# Patient Record
Sex: Female | Born: 1935 | Race: White | Hispanic: No | Marital: Single | Smoking: Never smoker
Health system: Southern US, Community
[De-identification: ages and names within clinical notes are randomized; demographics above are authoritative.]

## PROBLEM LIST (undated history)

## (undated) DIAGNOSIS — C801 Malignant (primary) neoplasm, unspecified: Secondary | ICD-10-CM

## (undated) DIAGNOSIS — I10 Essential (primary) hypertension: Secondary | ICD-10-CM

## (undated) DIAGNOSIS — M1990 Unspecified osteoarthritis, unspecified site: Secondary | ICD-10-CM

## (undated) DIAGNOSIS — M810 Age-related osteoporosis without current pathological fracture: Secondary | ICD-10-CM

## (undated) HISTORY — PX: HIP FRACTURE SURGERY: SHX118

## (undated) HISTORY — PX: TONSILLECTOMY: SUR1361

## (undated) HISTORY — PX: COLONOSCOPY: SHX174

## (undated) HISTORY — PX: CARPAL TUNNEL RELEASE: SHX101

## (undated) HISTORY — PX: APPENDECTOMY: SHX54

---

## 2014-08-30 DIAGNOSIS — Z85820 Personal history of malignant melanoma of skin: Secondary | ICD-10-CM | POA: Diagnosis not present

## 2014-08-30 DIAGNOSIS — D2330 Other benign neoplasm of skin of unspecified part of face: Secondary | ICD-10-CM | POA: Diagnosis not present

## 2014-08-30 DIAGNOSIS — D235 Other benign neoplasm of skin of trunk: Secondary | ICD-10-CM | POA: Diagnosis not present

## 2014-08-30 DIAGNOSIS — I781 Nevus, non-neoplastic: Secondary | ICD-10-CM | POA: Diagnosis not present

## 2014-08-30 DIAGNOSIS — D2372 Other benign neoplasm of skin of left lower limb, including hip: Secondary | ICD-10-CM | POA: Diagnosis not present

## 2014-08-30 DIAGNOSIS — L821 Other seborrheic keratosis: Secondary | ICD-10-CM | POA: Diagnosis not present

## 2014-08-30 DIAGNOSIS — L818 Other specified disorders of pigmentation: Secondary | ICD-10-CM | POA: Diagnosis not present

## 2014-08-30 DIAGNOSIS — D234 Other benign neoplasm of skin of scalp and neck: Secondary | ICD-10-CM | POA: Diagnosis not present

## 2014-08-30 DIAGNOSIS — D2362 Other benign neoplasm of skin of left upper limb, including shoulder: Secondary | ICD-10-CM | POA: Diagnosis not present

## 2014-08-30 DIAGNOSIS — D2361 Other benign neoplasm of skin of right upper limb, including shoulder: Secondary | ICD-10-CM | POA: Diagnosis not present

## 2014-08-30 DIAGNOSIS — D239 Other benign neoplasm of skin, unspecified: Secondary | ICD-10-CM | POA: Diagnosis not present

## 2014-08-30 DIAGNOSIS — D2371 Other benign neoplasm of skin of right lower limb, including hip: Secondary | ICD-10-CM | POA: Diagnosis not present

## 2014-11-08 DIAGNOSIS — B351 Tinea unguium: Secondary | ICD-10-CM | POA: Diagnosis not present

## 2014-11-08 DIAGNOSIS — I7389 Other specified peripheral vascular diseases: Secondary | ICD-10-CM | POA: Diagnosis not present

## 2015-02-05 DIAGNOSIS — I10 Essential (primary) hypertension: Secondary | ICD-10-CM | POA: Diagnosis not present

## 2015-03-05 DIAGNOSIS — H2513 Age-related nuclear cataract, bilateral: Secondary | ICD-10-CM | POA: Diagnosis not present

## 2015-05-13 DIAGNOSIS — S0081XA Abrasion of other part of head, initial encounter: Secondary | ICD-10-CM | POA: Diagnosis not present

## 2015-05-13 DIAGNOSIS — D229 Melanocytic nevi, unspecified: Secondary | ICD-10-CM | POA: Diagnosis not present

## 2015-05-14 DIAGNOSIS — Z23 Encounter for immunization: Secondary | ICD-10-CM | POA: Diagnosis not present

## 2015-05-22 DIAGNOSIS — L603 Nail dystrophy: Secondary | ICD-10-CM | POA: Diagnosis not present

## 2015-05-22 DIAGNOSIS — M79609 Pain in unspecified limb: Secondary | ICD-10-CM | POA: Diagnosis not present

## 2015-05-22 DIAGNOSIS — I739 Peripheral vascular disease, unspecified: Secondary | ICD-10-CM | POA: Diagnosis not present

## 2015-06-04 DIAGNOSIS — Z23 Encounter for immunization: Secondary | ICD-10-CM | POA: Diagnosis not present

## 2015-07-10 DIAGNOSIS — I10 Essential (primary) hypertension: Secondary | ICD-10-CM | POA: Diagnosis not present

## 2015-07-10 DIAGNOSIS — Z85820 Personal history of malignant melanoma of skin: Secondary | ICD-10-CM | POA: Diagnosis not present

## 2015-07-10 DIAGNOSIS — Z8781 Personal history of (healed) traumatic fracture: Secondary | ICD-10-CM | POA: Diagnosis not present

## 2015-07-10 DIAGNOSIS — M810 Age-related osteoporosis without current pathological fracture: Secondary | ICD-10-CM | POA: Diagnosis not present

## 2015-07-30 DIAGNOSIS — D1801 Hemangioma of skin and subcutaneous tissue: Secondary | ICD-10-CM | POA: Diagnosis not present

## 2015-07-30 DIAGNOSIS — L814 Other melanin hyperpigmentation: Secondary | ICD-10-CM | POA: Diagnosis not present

## 2015-07-30 DIAGNOSIS — L821 Other seborrheic keratosis: Secondary | ICD-10-CM | POA: Diagnosis not present

## 2015-07-30 DIAGNOSIS — Z85820 Personal history of malignant melanoma of skin: Secondary | ICD-10-CM | POA: Diagnosis not present

## 2015-10-10 DIAGNOSIS — L603 Nail dystrophy: Secondary | ICD-10-CM | POA: Diagnosis not present

## 2015-10-10 DIAGNOSIS — I739 Peripheral vascular disease, unspecified: Secondary | ICD-10-CM | POA: Diagnosis not present

## 2016-01-07 ENCOUNTER — Encounter (HOSPITAL_COMMUNITY): Payer: Self-pay | Admitting: Emergency Medicine

## 2016-01-07 ENCOUNTER — Emergency Department (HOSPITAL_COMMUNITY): Payer: Medicare Other

## 2016-01-07 ENCOUNTER — Emergency Department (HOSPITAL_COMMUNITY)
Admission: EM | Admit: 2016-01-07 | Discharge: 2016-01-07 | Disposition: A | Discharge status: Home / Self Care | Payer: Medicare Other | Attending: Emergency Medicine | Admitting: Emergency Medicine

## 2016-01-07 DIAGNOSIS — M25532 Pain in left wrist: Secondary | ICD-10-CM | POA: Diagnosis not present

## 2016-01-07 DIAGNOSIS — W0110XA Fall on same level from slipping, tripping and stumbling with subsequent striking against unspecified object, initial encounter: Secondary | ICD-10-CM | POA: Diagnosis not present

## 2016-01-07 DIAGNOSIS — Z79891 Long term (current) use of opiate analgesic: Secondary | ICD-10-CM | POA: Insufficient documentation

## 2016-01-07 DIAGNOSIS — T148 Other injury of unspecified body region: Secondary | ICD-10-CM | POA: Diagnosis not present

## 2016-01-07 DIAGNOSIS — Z85820 Personal history of malignant melanoma of skin: Secondary | ICD-10-CM | POA: Diagnosis not present

## 2016-01-07 DIAGNOSIS — I10 Essential (primary) hypertension: Secondary | ICD-10-CM | POA: Diagnosis not present

## 2016-01-07 DIAGNOSIS — S52612A Displaced fracture of left ulna styloid process, initial encounter for closed fracture: Secondary | ICD-10-CM | POA: Diagnosis not present

## 2016-01-07 DIAGNOSIS — S52302A Unspecified fracture of shaft of left radius, initial encounter for closed fracture: Secondary | ICD-10-CM | POA: Insufficient documentation

## 2016-01-07 DIAGNOSIS — S6992XA Unspecified injury of left wrist, hand and finger(s), initial encounter: Secondary | ICD-10-CM | POA: Diagnosis present

## 2016-01-07 DIAGNOSIS — Y999 Unspecified external cause status: Secondary | ICD-10-CM | POA: Insufficient documentation

## 2016-01-07 DIAGNOSIS — Z79899 Other long term (current) drug therapy: Secondary | ICD-10-CM | POA: Diagnosis not present

## 2016-01-07 DIAGNOSIS — Y92238 Other place in hospital as the place of occurrence of the external cause: Secondary | ICD-10-CM | POA: Diagnosis not present

## 2016-01-07 DIAGNOSIS — S52502A Unspecified fracture of the lower end of left radius, initial encounter for closed fracture: Secondary | ICD-10-CM | POA: Diagnosis not present

## 2016-01-07 DIAGNOSIS — S52592A Other fractures of lower end of left radius, initial encounter for closed fracture: Secondary | ICD-10-CM | POA: Diagnosis not present

## 2016-01-07 DIAGNOSIS — Y939 Activity, unspecified: Secondary | ICD-10-CM | POA: Insufficient documentation

## 2016-01-07 DIAGNOSIS — S52572A Other intraarticular fracture of lower end of left radius, initial encounter for closed fracture: Secondary | ICD-10-CM | POA: Diagnosis not present

## 2016-01-07 DIAGNOSIS — Z8781 Personal history of (healed) traumatic fracture: Secondary | ICD-10-CM | POA: Diagnosis not present

## 2016-01-07 DIAGNOSIS — M810 Age-related osteoporosis without current pathological fracture: Secondary | ICD-10-CM | POA: Diagnosis not present

## 2016-01-07 DIAGNOSIS — Z0000 Encounter for general adult medical examination without abnormal findings: Secondary | ICD-10-CM | POA: Diagnosis not present

## 2016-01-07 MED ORDER — HYDROCODONE-ACETAMINOPHEN 5-325 MG PO TABS: 1.0000 | ORAL_TABLET | Freq: Four times a day (QID) | ORAL | Status: DC | PRN

## 2016-01-07 MED ORDER — OXYCODONE-ACETAMINOPHEN 5-325 MG PO TABS
1.0000 | ORAL_TABLET | Freq: Once | ORAL | Status: AC
  Administered 2016-01-07: 1 via ORAL
  Filled 2016-01-07: qty 1

## 2016-01-07 MED ORDER — ONDANSETRON HCL 4 MG/2ML IJ SOLN
4.0000 mg | Freq: Once | INTRAMUSCULAR | Status: AC
  Administered 2016-01-07: 4 mg via INTRAVENOUS
  Filled 2016-01-07: qty 2

## 2016-01-07 MED ORDER — MORPHINE SULFATE (PF) 4 MG/ML IV SOLN
4.0000 mg | Freq: Once | INTRAVENOUS | Status: AC
  Administered 2016-01-07: 4 mg via INTRAVENOUS
  Filled 2016-01-07: qty 1

## 2016-01-07 NOTE — ED Notes (Addendum)
Patient here via EMS with complaints of fall today at PCP office. Here pain to left wrist with deformity. Pain 10/10. 100 mcg Fent. given

## 2016-01-07 NOTE — ED Notes (Signed)
Bed: [id number] Expected date:  Expected time:  Means of arrival:  Comments: 79, fall wrist deformity, syncopal after - first available room on arrival

## 2016-01-07 NOTE — ED Provider Notes (Signed)
CSN: [id number]     Arrival date & time 01/07/16  1313 History   First MD Initiated Contact with Patient 01/07/16 1328     Chief Complaint  Patient presents with  . Fall  . Wrist Pain     (Consider location/radiation/quality/duration/timing/severity/associated sxs/prior Treatment) HPI Comments: 80 year old female with hypertension who presents with left wrist pain. Just prior to arrival, the patient was at her PCP office for a physical and slipped, falling on her left outstretched hand and her bottom. She had an immediate onset of severe, constant left wrist pain. She also endorses mild pain in her sacrum. She denies any head injury, head or neck pain, or other injury. She denies any anticoagulant use. She states she felt lightheaded after the fall, she thinks because of pain.  Patient is a 80 y.o. female presenting with fall and wrist pain. The history is provided by the patient.  Fall  Wrist Pain    History reviewed. No pertinent past medical history. History reviewed. No pertinent past surgical history. History reviewed. No pertinent family history. Social History  Substance Use Topics  . Smoking status: Never Smoker   . Smokeless tobacco: None  . Alcohol Use: None   OB History    No data available     Review of Systems 10 Systems reviewed and are negative for acute change except as noted in the HPI.    Allergies  Review of patient's allergies indicates no known allergies.  Home Medications   Prior to Admission medications   Medication Sig Start Date End Date Taking? Authorizing Provider  alendronate (FOSAMAX) 70 MG tablet Take 70 mg by mouth every Saturday. Take with a full glass of water on an empty stomach.   Yes Historical Provider, MD  lisinopril (PRINIVIL,ZESTRIL) 20 MG tablet Take 20 mg by mouth daily.   Yes Historical Provider, MD  Multiple Vitamin (MULTIVITAMIN WITH MINERALS) TABS tablet Take 1 tablet by mouth daily.   Yes Historical Provider, MD  naproxen  sodium (ANAPROX) 220 MG tablet Take 220 mg by mouth daily as needed (pain).   Yes Historical Provider, MD  HYDROcodone-acetaminophen (NORCO/VICODIN) 5-325 MG tablet Take 1-2 tablets by mouth every 6 (six) hours as needed for severe pain. 01/07/16   Wenda Overland Lasharon Dunivan, MD   BP 130/53 mmHg  Pulse 60  Temp(Src) 98.2 F (36.8 C) (Oral)  Resp 17  SpO2 98% Physical Exam  Constitutional: She is oriented to person, place, and time. She appears well-developed and well-nourished. No distress.  HENT:  Head: Normocephalic and atraumatic.  Moist mucous membranes  Eyes: Conjunctivae are normal. Pupils are equal, round, and reactive to light.  Neck: Normal range of motion. Neck supple.  Cardiovascular: Normal rate, regular rhythm, normal heart sounds and intact distal pulses.   No murmur heard. Pulmonary/Chest: Effort normal and breath sounds normal. She exhibits no tenderness.  Abdominal: Soft. Bowel sounds are normal. She exhibits no distension. There is no tenderness.  Musculoskeletal: She exhibits edema and tenderness.  Closed deformity of distal L forearm; normal sensation and cap refill L fingers; normal ROM L elbow and shoulder without tenderness  Neurological: She is alert and oriented to person, place, and time.  Fluent speech  Skin: Skin is warm and dry.  Psychiatric: She has a normal mood and affect. Judgment normal.  Nursing note and vitals reviewed.   ED Course  Procedures (including critical care time) Labs Review Labs Reviewed - No data to display  Imaging Review Dg Forearm Left  01/07/2016  CLINICAL DATA:  Left wrist fracture status post fall. EXAM: LEFT FOREARM - 2 VIEW COMPARISON:  None. FINDINGS: There is a impacted dorsally displaced distal radial metaphysis fracture. There is a nondisplaced fracture at the base of the ulnar styloid process. There is no other fracture or dislocation. There is no elbow joint effusion. There are calcifications adjacent to the radial tuberosity  in the region of the biceps tendon most concerning for calcific tendinosis. IMPRESSION: 1. Impacted dorsally displaced distal radial metaphysis fracture. 2. Nondisplaced fracture at the base of the ulnar styloid process. Electronically Signed   By: Kathreen Devoid   On: 01/07/2016 14:21   Dg Wrist Complete Left  01/07/2016  CLINICAL DATA:  80 year old female with left wrist pain sustained following a fall at her primary care physician's office. EXAM: LEFT WRIST - COMPLETE 3+ VIEW COMPARISON:  None. FINDINGS: Acute impacted and dorsally displaced distal radius fracture with intra-articular involvement. Additionally, there is a mildly displaced fracture of the ulnar styloid. The bones appear diffusely osteopenic. There is calcification in the region of the triangular fibrocartilage. The scaphoid and carpus appear intact. Small subchondral cysts present in the base of the capitate. There is soft tissue swelling along the dorsal aspect of the wrist. IMPRESSION: 1. Impacted and dorsally displaced distal radius fracture with intra-articular involvement. 2. Minimally displaced ulnar styloid fracture. 3. The bones appear osteopenic. Electronically Signed   By: Jacqulynn Cadet M.D.   On: 01/07/2016 13:48   I have personally reviewed and evaluated these images as part of my medical decision-making.   EKG Interpretation None     Medications  morphine 4 MG/ML injection 4 mg (4 mg Intravenous Given 01/07/16 1429)  ondansetron (ZOFRAN) injection 4 mg (4 mg Intravenous Given 01/07/16 1429)  oxyCODONE-acetaminophen (PERCOCET/ROXICET) 5-325 MG per tablet 1 tablet (1 tablet Oral Given 01/07/16 1537)    MDM   Final diagnoses:  Distal radius fracture, left, closed, initial encounter  Fracture of ulnar styloid, left, closed, initial encounter   Pt w/ L wrist pain after fall from standing. On exam, she had a closed deformity of her left wrist, she was neurovascularly intact. She complained of pain on her bottom where  she fell but denied any other pain. No head injury or neck pain. Plain films show impacted and dorsally displaced distal radius fracture with intra-articular involvement as well as displaced ulnar styloid fracture. He morphine for pain. Discussed with hand surgery, Dr. [name], who recommended sugar tong splint and follow-up in his clinic in 2 days for discussion of operative management. Patient instructed on follow up plan as well as ice, elevation, and pain control. Return precautions reviewed and patient discharged in satisfactory condition.  Sharlett Iles, MD 01/07/16 [phone]

## 2016-01-09 DIAGNOSIS — S52552A Other extraarticular fracture of lower end of left radius, initial encounter for closed fracture: Secondary | ICD-10-CM | POA: Diagnosis not present

## 2016-01-13 ENCOUNTER — Other Ambulatory Visit: Payer: Self-pay | Admitting: Orthopedic Surgery

## 2016-01-15 ENCOUNTER — Encounter (HOSPITAL_COMMUNITY): Payer: Self-pay | Admitting: *Deleted

## 2016-01-15 NOTE — Progress Notes (Signed)
Pt denies cardiac history, chest pain or sob. 

## 2016-01-16 ENCOUNTER — Encounter (HOSPITAL_COMMUNITY)
Admission: RE | Disposition: A | Discharge status: Home Health | Payer: Self-pay | Source: Ambulatory Visit | Attending: Orthopedic Surgery

## 2016-01-16 ENCOUNTER — Encounter (HOSPITAL_COMMUNITY): Payer: Self-pay | Admitting: *Deleted

## 2016-01-16 ENCOUNTER — Ambulatory Visit (HOSPITAL_COMMUNITY): Payer: Medicare Other | Admitting: Anesthesiology

## 2016-01-16 ENCOUNTER — Inpatient Hospital Stay (HOSPITAL_COMMUNITY)
Admission: RE | Admit: 2016-01-16 | Discharge: 2016-01-18 | DRG: 512 | Disposition: A | Discharge status: Home Health | Payer: Medicare Other | Source: Ambulatory Visit | Attending: Orthopedic Surgery | Admitting: Orthopedic Surgery

## 2016-01-16 DIAGNOSIS — S52509A Unspecified fracture of the lower end of unspecified radius, initial encounter for closed fracture: Secondary | ICD-10-CM | POA: Diagnosis present

## 2016-01-16 DIAGNOSIS — S52613A Displaced fracture of unspecified ulna styloid process, initial encounter for closed fracture: Secondary | ICD-10-CM | POA: Diagnosis present

## 2016-01-16 DIAGNOSIS — Z7983 Long term (current) use of bisphosphonates: Secondary | ICD-10-CM

## 2016-01-16 DIAGNOSIS — S52552A Other extraarticular fracture of lower end of left radius, initial encounter for closed fracture: Secondary | ICD-10-CM | POA: Diagnosis not present

## 2016-01-16 DIAGNOSIS — G8918 Other acute postprocedural pain: Secondary | ICD-10-CM | POA: Diagnosis not present

## 2016-01-16 DIAGNOSIS — S52502A Unspecified fracture of the lower end of left radius, initial encounter for closed fracture: Secondary | ICD-10-CM | POA: Diagnosis not present

## 2016-01-16 DIAGNOSIS — W1830XA Fall on same level, unspecified, initial encounter: Secondary | ICD-10-CM | POA: Diagnosis present

## 2016-01-16 DIAGNOSIS — Z85820 Personal history of malignant melanoma of skin: Secondary | ICD-10-CM

## 2016-01-16 DIAGNOSIS — I10 Essential (primary) hypertension: Secondary | ICD-10-CM | POA: Diagnosis present

## 2016-01-16 DIAGNOSIS — M810 Age-related osteoporosis without current pathological fracture: Secondary | ICD-10-CM | POA: Diagnosis present

## 2016-01-16 DIAGNOSIS — S52609A Unspecified fracture of lower end of unspecified ulna, initial encounter for closed fracture: Secondary | ICD-10-CM

## 2016-01-16 DIAGNOSIS — S52572A Other intraarticular fracture of lower end of left radius, initial encounter for closed fracture: Secondary | ICD-10-CM | POA: Diagnosis present

## 2016-01-16 HISTORY — DX: Unspecified osteoarthritis, unspecified site: M19.90

## 2016-01-16 HISTORY — DX: Malignant (primary) neoplasm, unspecified: C80.1

## 2016-01-16 HISTORY — DX: Age-related osteoporosis without current pathological fracture: M81.0

## 2016-01-16 HISTORY — DX: Essential (primary) hypertension: I10

## 2016-01-16 HISTORY — PX: OPEN REDUCTION INTERNAL FIXATION (ORIF) DISTAL RADIAL FRACTURE: SHX5989

## 2016-01-16 LAB — CBC
HEMATOCRIT: 36.2 % (ref 36.0–46.0)
HEMOGLOBIN: 11.6 g/dL — AB (ref 12.0–15.0)
MCH: 29.2 pg (ref 26.0–34.0)
MCHC: 32 g/dL (ref 30.0–36.0)
MCV: 91.2 fL (ref 78.0–100.0)
PLATELETS: 276 10*3/uL (ref 150–400)
RBC: 3.97 MIL/uL (ref 3.87–5.11)
RDW: 13.8 % (ref 11.5–15.5)
WBC: 5.8 10*3/uL (ref 4.0–10.5)

## 2016-01-16 LAB — BASIC METABOLIC PANEL
ANION GAP: 8 (ref 5–15)
BUN: 18 mg/dL (ref 6–20)
CHLORIDE: 104 mmol/L (ref 101–111)
CO2: 27 mmol/L (ref 22–32)
CREATININE: 0.78 mg/dL (ref 0.44–1.00)
Calcium: 9.5 mg/dL (ref 8.9–10.3)
GFR calc non Af Amer: >60 mL/min (ref >60)
Glucose, Bld: 106 mg/dL — ABNORMAL HIGH (ref 65–99)
POTASSIUM: 3.8 mmol/L (ref 3.5–5.1)
SODIUM: 139 mmol/L (ref 135–145)

## 2016-01-16 SURGERY — OPEN REDUCTION INTERNAL FIXATION (ORIF) DISTAL RADIUS FRACTURE
Anesthesia: Regional | Laterality: Left

## 2016-01-16 MED ORDER — FENTANYL CITRATE (PF) 250 MCG/5ML IJ SOLN
INTRAMUSCULAR | Status: AC
  Filled 2016-01-16: qty 5

## 2016-01-16 MED ORDER — 0.9 % SODIUM CHLORIDE (POUR BTL) OPTIME
TOPICAL | Status: DC | PRN
  Administered 2016-01-16: 1000 mL

## 2016-01-16 MED ORDER — LACTATED RINGERS IV SOLN
INTRAVENOUS | Status: DC | PRN
Start: 2016-01-16 — End: 2016-01-16
  Administered 2016-01-16 (×2): via INTRAVENOUS

## 2016-01-16 MED ORDER — SENNA 8.6 MG PO TABS
1.0000 | ORAL_TABLET | Freq: Two times a day (BID) | ORAL | Status: DC
  Administered 2016-01-16 – 2016-01-18 (×4): 8.6 mg via ORAL
  Filled 2016-01-16 (×4): qty 1

## 2016-01-16 MED ORDER — BUPIVACAINE-EPINEPHRINE (PF) 0.5% -1:200000 IJ SOLN
INTRAMUSCULAR | Status: DC | PRN
  Administered 2016-01-16: 20 mL via PERINEURAL

## 2016-01-16 MED ORDER — PHENYLEPHRINE HCL 10 MG/ML IJ SOLN
20.0000 mg | INTRAVENOUS | Status: DC | PRN
  Administered 2016-01-16: 20 ug/min via INTRAVENOUS

## 2016-01-16 MED ORDER — VITAMIN C 500 MG PO TABS
1000.0000 mg | ORAL_TABLET | Freq: Every day | ORAL | Status: DC
  Administered 2016-01-16 – 2016-01-18 (×3): 1000 mg via ORAL
  Filled 2016-01-16 (×3): qty 2

## 2016-01-16 MED ORDER — BUPIVACAINE HCL (PF) 0.25 % IJ SOLN
INTRAMUSCULAR | Status: AC
  Filled 2016-01-16: qty 30

## 2016-01-16 MED ORDER — LISINOPRIL 20 MG PO TABS
20.0000 mg | ORAL_TABLET | Freq: Every day | ORAL | Status: DC
Start: 2016-01-16 — End: 2016-01-18
  Administered 2016-01-16 – 2016-01-18 (×2): 20 mg via ORAL
  Filled 2016-01-16 (×2): qty 1

## 2016-01-16 MED ORDER — LIDOCAINE 2% (20 MG/ML) 5 ML SYRINGE
INTRAMUSCULAR | Status: AC
  Filled 2016-01-16: qty 5

## 2016-01-16 MED ORDER — PHENYLEPHRINE HCL 10 MG/ML IJ SOLN
INTRAMUSCULAR | Status: DC | PRN
Start: 2016-01-16 — End: 2016-01-16
  Administered 2016-01-16 (×3): 80 ug via INTRAVENOUS
  Administered 2016-01-16: 120 ug via INTRAVENOUS
  Administered 2016-01-16: 40 ug via INTRAVENOUS

## 2016-01-16 MED ORDER — CEFAZOLIN SODIUM 1-5 GM-% IV SOLN
1.0000 g | Freq: Three times a day (TID) | INTRAVENOUS | Status: DC
  Administered 2016-01-17 – 2016-01-18 (×5): 1 g via INTRAVENOUS
  Filled 2016-01-16 (×7): qty 50

## 2016-01-16 MED ORDER — MORPHINE SULFATE (PF) 2 MG/ML IV SOLN
1.0000 mg | INTRAVENOUS | Status: DC | PRN
  Administered 2016-01-17: 1 mg via INTRAVENOUS
  Filled 2016-01-16: qty 1

## 2016-01-16 MED ORDER — ONDANSETRON HCL 4 MG PO TABS: 4.0000 mg | ORAL_TABLET | Freq: Four times a day (QID) | ORAL | Status: DC | PRN

## 2016-01-16 MED ORDER — ADULT MULTIVITAMIN W/MINERALS CH
1.0000 | ORAL_TABLET | Freq: Every day | ORAL | Status: DC
  Administered 2016-01-16 – 2016-01-18 (×3): 1 via ORAL
  Filled 2016-01-16 (×3): qty 1

## 2016-01-16 MED ORDER — CEFAZOLIN SODIUM 1-5 GM-% IV SOLN
1.0000 g | INTRAVENOUS | Status: AC
  Administered 2016-01-16: 1 g via INTRAVENOUS
  Filled 2016-01-16: qty 50

## 2016-01-16 MED ORDER — FENTANYL CITRATE (PF) 100 MCG/2ML IJ SOLN
INTRAMUSCULAR | Status: DC | PRN
Start: 2016-01-16 — End: 2016-01-16
  Administered 2016-01-16 (×2): 50 ug via INTRAVENOUS

## 2016-01-16 MED ORDER — CEFAZOLIN SODIUM-DEXTROSE 2-4 GM/100ML-% IV SOLN
INTRAVENOUS | Status: AC
  Filled 2016-01-16: qty 100

## 2016-01-16 MED ORDER — POVIDONE-IODINE 10 % EX SWAB
2.0000 "application " | Freq: Once | CUTANEOUS | Status: AC
  Administered 2016-01-16: 2 via TOPICAL

## 2016-01-16 MED ORDER — CHLORHEXIDINE GLUCONATE 4 % EX LIQD: 60.0000 mL | Freq: Once | CUTANEOUS | Status: DC

## 2016-01-16 MED ORDER — MIDAZOLAM HCL 2 MG/2ML IJ SOLN
INTRAMUSCULAR | Status: AC
  Administered 2016-01-16: 0.5 mg
  Filled 2016-01-16: qty 2

## 2016-01-16 MED ORDER — ONDANSETRON HCL 4 MG/2ML IJ SOLN
INTRAMUSCULAR | Status: DC | PRN
  Administered 2016-01-16: 4 mg via INTRAVENOUS

## 2016-01-16 MED ORDER — OXYCODONE HCL 5 MG PO TABS
5.0000 mg | ORAL_TABLET | ORAL | Status: DC | PRN
  Administered 2016-01-17: 10 mg via ORAL
  Administered 2016-01-17: 5 mg via ORAL
  Administered 2016-01-17: 10 mg via ORAL
  Filled 2016-01-16 (×3): qty 1
  Filled 2016-01-16: qty 2

## 2016-01-16 MED ORDER — PROPOFOL 10 MG/ML IV BOLUS
INTRAVENOUS | Status: DC | PRN
  Administered 2016-01-16: 130 mg via INTRAVENOUS

## 2016-01-16 MED ORDER — GLYCOPYRROLATE 0.2 MG/ML IJ SOLN
INTRAMUSCULAR | Status: DC | PRN
  Administered 2016-01-16: 0.2 mg via INTRAVENOUS

## 2016-01-16 MED ORDER — LIDOCAINE HCL (CARDIAC) 20 MG/ML IV SOLN
INTRAVENOUS | Status: DC | PRN
  Administered 2016-01-16: 40 mg via INTRAVENOUS

## 2016-01-16 MED ORDER — LACTATED RINGERS IV SOLN: INTRAVENOUS | Status: DC

## 2016-01-16 MED ORDER — FENTANYL CITRATE (PF) 100 MCG/2ML IJ SOLN
INTRAMUSCULAR | Status: AC
  Administered 2016-01-16: 50 ug
  Filled 2016-01-16: qty 2

## 2016-01-16 MED ORDER — ONDANSETRON HCL 4 MG/2ML IJ SOLN: 4.0000 mg | Freq: Four times a day (QID) | INTRAMUSCULAR | Status: DC | PRN

## 2016-01-16 MED ORDER — CEFAZOLIN SODIUM-DEXTROSE 2-4 GM/100ML-% IV SOLN
2.0000 g | INTRAVENOUS | Status: AC
  Administered 2016-01-16: 2 g via INTRAVENOUS

## 2016-01-16 SURGICAL SUPPLY — 62 items
BANDAGE ELASTIC 3 VELCRO ST LF (GAUZE/BANDAGES/DRESSINGS) ×3 IMPLANT
BANDAGE ELASTIC 4 VELCRO ST LF (GAUZE/BANDAGES/DRESSINGS) ×3 IMPLANT
BIT DRILL 2.2 SS TIBIAL (BIT) ×3 IMPLANT
BLADE SURG ROTATE 9660 (MISCELLANEOUS) IMPLANT
BNDG ESMARK 4X9 LF (GAUZE/BANDAGES/DRESSINGS) ×3 IMPLANT
BNDG GAUZE ELAST 4 BULKY (GAUZE/BANDAGES/DRESSINGS) ×3 IMPLANT
CORDS BIPOLAR (ELECTRODE) ×3 IMPLANT
COVER SURGICAL LIGHT HANDLE (MISCELLANEOUS) ×3 IMPLANT
CUFF TOURNIQUET SINGLE 18IN (TOURNIQUET CUFF) ×3 IMPLANT
CUFF TOURNIQUET SINGLE 24IN (TOURNIQUET CUFF) IMPLANT
DECANTER SPIKE VIAL GLASS SM (MISCELLANEOUS) IMPLANT
DRAIN TLS ROUND 10FR (DRAIN) IMPLANT
DRAPE OEC MINIVIEW 54X84 (DRAPES) IMPLANT
DRAPE U-SHAPE 47X51 STRL (DRAPES) ×3 IMPLANT
DRSG ADAPTIC 3X8 NADH LF (GAUZE/BANDAGES/DRESSINGS) ×3 IMPLANT
GAUZE SPONGE 4X4 12PLY STRL (GAUZE/BANDAGES/DRESSINGS) ×3 IMPLANT
GAUZE XEROFORM 5X9 LF (GAUZE/BANDAGES/DRESSINGS) ×3 IMPLANT
GLOVE BIOGEL M 8.0 STRL (GLOVE) ×3 IMPLANT
GLOVE SS BIOGEL STRL SZ 8 (GLOVE) ×1 IMPLANT
GLOVE SUPERSENSE BIOGEL SZ 8 (GLOVE) ×2
GOWN STRL REUS W/ TWL LRG LVL3 (GOWN DISPOSABLE) ×3 IMPLANT
GOWN STRL REUS W/ TWL XL LVL3 (GOWN DISPOSABLE) ×3 IMPLANT
GOWN STRL REUS W/TWL LRG LVL3 (GOWN DISPOSABLE) ×6
GOWN STRL REUS W/TWL XL LVL3 (GOWN DISPOSABLE) ×6
KIT BASIN OR (CUSTOM PROCEDURE TRAY) ×3 IMPLANT
KIT ROOM TURNOVER OR (KITS) ×3 IMPLANT
LOOP VESSEL MAXI BLUE (MISCELLANEOUS) IMPLANT
MANIFOLD NEPTUNE II (INSTRUMENTS) ×3 IMPLANT
NEEDLE 22X1 1/2 (OR ONLY) (NEEDLE) IMPLANT
NS IRRIG 1000ML POUR BTL (IV SOLUTION) ×3 IMPLANT
PACK ORTHO EXTREMITY (CUSTOM PROCEDURE TRAY) ×3 IMPLANT
PAD ARMBOARD 7.5X6 YLW CONV (MISCELLANEOUS) ×6 IMPLANT
PAD CAST 4YDX4 CTTN HI CHSV (CAST SUPPLIES) ×1 IMPLANT
PADDING CAST COTTON 4X4 STRL (CAST SUPPLIES) ×2
PEG LOCKING SMOOTH 2.2X16 (Screw) ×3 IMPLANT
PEG LOCKING SMOOTH 2.2X18 (Peg) ×3 IMPLANT
PEG LOCKING SMOOTH 2.2X20 (Screw) ×9 IMPLANT
PEG LOCKING SMOOTH 2.2X22 (Screw) ×6 IMPLANT
PEG LOCKING SMOOTH 2.2X24 (Peg) ×3 IMPLANT
PLATE STANDARD DVR LEFT (Plate) ×3 IMPLANT
PLATE STD DVR LT 24X51 (Plate) ×1 IMPLANT
PUTTY DBM STAGRAFT PLUS 2CC (Putty) ×3 IMPLANT
SCREW LOCK 14X2.7X 3 LD TPR (Screw) ×2 IMPLANT
SCREW LOCK 16X2.7X 3 LD TPR (Screw) ×1 IMPLANT
SCREW LOCKING 2.7X14 (Screw) ×4 IMPLANT
SCREW LOCKING 2.7X15MM (Screw) ×6 IMPLANT
SCREW LOCKING 2.7X16 (Screw) ×2 IMPLANT
SCREW MULTI DIRECTIONAL 2.7X14 (Screw) ×6 IMPLANT
SCREW MULTI DIRECTIONAL 2.7X16 (Screw) ×3 IMPLANT
SPONGE LAP 4X18 X RAY DECT (DISPOSABLE) IMPLANT
SUT MNCRL AB 4-0 PS2 18 (SUTURE) ×3 IMPLANT
SUT PROLENE 3 0 PS 2 (SUTURE) IMPLANT
SUT VIC AB 3-0 FS2 27 (SUTURE) IMPLANT
SYR CONTROL 10ML LL (SYRINGE) IMPLANT
SYSTEM CHEST DRAIN TLS 7FR (DRAIN) ×3 IMPLANT
TOWEL OR 17X24 6PK STRL BLUE (TOWEL DISPOSABLE) ×3 IMPLANT
TOWEL OR 17X26 10 PK STRL BLUE (TOWEL DISPOSABLE) ×3 IMPLANT
TUBE CONNECTING 12'X1/4 (SUCTIONS) ×1
TUBE CONNECTING 12X1/4 (SUCTIONS) ×2 IMPLANT
TUBE EVACUATION TLS (MISCELLANEOUS) ×3 IMPLANT
UNDERPAD 30X30 INCONTINENT (UNDERPADS AND DIAPERS) ×3 IMPLANT
WATER STERILE IRR 1000ML POUR (IV SOLUTION) ×3 IMPLANT

## 2016-01-16 NOTE — Anesthesia Preprocedure Evaluation (Signed)
Anesthesia Evaluation  Patient identified by MRN, date of birth, ID band Patient awake    Reviewed: Allergy & Precautions, NPO status , Patient's Chart, lab work & pertinent test results  History of Anesthesia Complications Negative for: history of anesthetic complications  Airway Mallampati: I  TM Distance: >3 FB Neck ROM: Full    Dental  (+) Teeth Intact   Pulmonary neg pulmonary ROS,    breath sounds clear to auscultation       Cardiovascular hypertension,  Rhythm:Regular Rate:Normal     Neuro/Psych negative neurological ROS     GI/Hepatic negative GI ROS, Neg liver ROS,   Endo/Other  negative endocrine ROS  Renal/GU negative Renal ROS     Musculoskeletal  (+) Arthritis ,   Abdominal   Peds  Hematology negative hematology ROS (+)   Anesthesia Other Findings   Reproductive/Obstetrics                             Anesthesia Physical Anesthesia Plan  ASA: II  Anesthesia Plan: Regional   Post-op Pain Management:    Induction: Intravenous  Airway Management Planned: LMA  Additional Equipment:   Intra-op Plan:   Post-operative Plan:   Informed Consent: I have reviewed the patients History and Physical, chart, labs and discussed the procedure including the risks, benefits and alternatives for the proposed anesthesia with the patient or authorized representative who has indicated his/her understanding and acceptance.     Plan Discussed with: CRNA and Surgeon  Anesthesia Plan Comments:         Anesthesia Quick Evaluation

## 2016-01-16 NOTE — Op Note (Signed)
See [id number] [name] MD

## 2016-01-16 NOTE — Anesthesia Postprocedure Evaluation (Signed)
Anesthesia Post Note  Patient: Holly Salazar  Procedure(s) Performed: Procedure(s) (LRB): OPEN REDUCTION INTERNAL FIXATION (ORIF) LEFT  DISTAL RADIUS FRACTURE WITH REPAIR RECONSTRUCTION AS NEEDED  (Left)  Patient location during evaluation: PACU Anesthesia Type: General Level of consciousness: awake and alert Pain management: pain level controlled Vital Signs Assessment: post-procedure vital signs reviewed and stable Respiratory status: spontaneous breathing, nonlabored ventilation, respiratory function stable and patient connected to nasal cannula oxygen Cardiovascular status: blood pressure returned to baseline and stable Postop Assessment: no signs of nausea or vomiting Anesthetic complications: no    Last Vitals:  Filed Vitals:   01/16/16 1931 01/16/16 2000  BP: 114/62 110/98  Pulse: 57 61  Temp:  36.7 C  Resp: 17 14    Last Pain:  Filed Vitals:   01/16/16 2010  PainSc: 0-No pain                 Catalina Gravel

## 2016-01-16 NOTE — Anesthesia Procedure Notes (Addendum)
Anesthesia Regional Block:  Supraclavicular block  Pre-Anesthetic Checklist: ,, timeout performed, Correct Patient, Correct Site, Correct Laterality, Correct Procedure, Correct Position, site marked, Risks and benefits discussed,  Surgical consent,  Pre-op evaluation,  At surgeon's request and post-op pain management  Laterality: Left and Upper  Prep: chloraprep       Needles:   Needle Type: Echogenic Stimulator Needle     Needle Length: 9cm 9 cm Needle Gauge: 22 and 22 G  Needle insertion depth: 4 cm   Additional Needles:  Procedures: ultrasound guided (picture in chart) and nerve stimulator Supraclavicular block Narrative:  Start time: 01/16/2016 2:50 PM End time: 01/16/2016 3:05 PM Injection made incrementally with aspirations every 5 mL.  Performed by: Personally  Anesthesiologist: MASSAG[place], TERRY  Additional Notes: Tolerated well   Procedure Name: LMA Insertion Date/Time: 01/16/2016 3:44 PM Performed by: Willeen Cass P Pre-anesthesia Checklist: Patient identified, Emergency Drugs available, Suction available, Patient being monitored and Timeout performed Patient Re-evaluated:Patient Re-evaluated prior to inductionOxygen Delivery Method: Circle system utilized Preoxygenation: Pre-oxygenation with 100% oxygen Intubation Type: IV induction Ventilation: Mask ventilation without difficulty LMA: LMA inserted LMA Size: 4.0 Number of attempts: 2 Tube secured with: Tape Dental Injury: Teeth and Oropharynx as per pre-operative assessment

## 2016-01-16 NOTE — H&P (Signed)
Holly Salazar is an 80 y.o. female.   Chief Complaint: fractured wrist HPI: 80 yo Status post a left distal radius fracture comminuted and displaced in nature. We have seen and evaluated the patient in our office setting and have discussed with her all recommendations for operative care. We have had a lengthy discussion with she, her family members as well as her family physician In regards to her care. The patient desires to proceed with surgical intervention. Past Medical History  Diagnosis Date  . Hypertension   . Osteoporosis   . Arthritis     knee and back  . Cancer ([place])     melanoma on back    Past Surgical History  Procedure Laterality Date  . Hip fracture surgery Right   . Tonsillectomy    . Appendectomy    . Colonoscopy    . Carpal tunnel release Right     Family History  Problem Relation Age of Onset  . Heart disease Mother   . Cancer Father    Social History:  reports that she has never smoked. She has never used smokeless tobacco. She reports that she does not drink alcohol or use illicit drugs.  Allergies: No Known Allergies  Medications Prior to Admission  Medication Sig Dispense Refill  . alendronate (FOSAMAX) 70 MG tablet Take 70 mg by mouth every Saturday. Take with a full glass of water on an empty stomach.    [name] HYDROcodone-acetaminophen (NORCO/VICODIN) 5-325 MG tablet Take 1-2 tablets by mouth every 6 (six) hours as needed for severe pain. 12 tablet 0  . lisinopril (PRINIVIL,ZESTRIL) 20 MG tablet Take 20 mg by mouth daily.    . Multiple Vitamin (MULTIVITAMIN WITH MINERALS) TABS tablet Take 1 tablet by mouth daily.    . naproxen sodium (ANAPROX) 220 MG tablet Take 220 mg by mouth daily as needed (pain).      Results for orders placed or performed during the hospital encounter of 01/16/16 (from the past 48 hour(s))  Basic metabolic panel     Status: Abnormal   Collection Time: 01/16/16  1:15 PM  Result Value Ref Range   Sodium 139 135 - 145 mmol/L    Potassium 3.8 3.5 - 5.1 mmol/L   Chloride 104 101 - 111 mmol/L   CO2 27 22 - 32 mmol/L   Glucose, Bld 106 (H) 65 - 99 mg/dL   BUN 18 6 - 20 mg/dL   Creatinine, Ser 0.78 0.44 - 1.00 mg/dL   Calcium 9.5 8.9 - 10.3 mg/dL   GFR calc non Af Amer >60 >60 mL/min   GFR calc Af Amer >60 >60 mL/min    Comment: (NOTE) The eGFR has been calculated using the CKD EPI equation. This calculation has not been validated in all clinical situations. eGFR's persistently <60 mL/min signify possible Chronic Kidney Disease.    Anion gap 8 5 - 15  CBC     Status: Abnormal   Collection Time: 01/16/16  1:15 PM  Result Value Ref Range   WBC 5.8 4.0 - 10.5 K/uL   RBC 3.97 3.87 - 5.11 MIL/uL   Hemoglobin 11.6 (L) 12.0 - 15.0 g/dL   HCT 36.2 36.0 - 46.0 %   MCV 91.2 78.0 - 100.0 fL   MCH 29.2 26.0 - 34.0 pg   MCHC 32.0 30.0 - 36.0 g/dL   RDW 13.8 11.5 - 15.5 %   Platelets 276 150 - 400 K/uL   No results found.  Review of Systems  Constitutional: Negative.  HENT: Negative.   Eyes: Negative.   Respiratory: Negative.   Cardiovascular: Negative.   Gastrointestinal: Negative.   Musculoskeletal:       See HPI  Skin: Negative.     Blood pressure 138/34, pulse 86, temperature 98 F (36.7 C), temperature source Oral, resp. rate 15, height '5\' 7"'$  (1.702 m), weight 59.421 kg (131 lb), SpO2 99 %. Physical Exam  The patient is alert and oriented in no acute distress. The patient complains of pain in the affected upper extremity.  The patient is noted to have a normal HEENT exam. Lung fields show equal chest expansion and no shortness of breath. Abdomen exam is nontender without distention. Lower extremity examination does not show any fracture dislocation or blood clot symptoms. Pelvis is stable and the neck and back are stable and nontender. Lamination of the left upper extremity reveals that her splint is clean dry and intact, digital range of motion is intact, neurovascularly she is  intact Assessment/Plan Comminuted displaced left distal radius fracture .We are planning surgery for your upper extremity. The risk and benefits of surgery to include risk of bleeding, infection, anesthesia,  damage to normal structures and failure of the surgery to accomplish its intended goals of relieving symptoms and restoring function have been discussed in detail. With this in mind we plan to proceed. I have specifically discussed with the patient the pre-and postoperative regime and the dos and don'ts and risk and benefits in great detail. Risk and benefits of surgery also include risk of dystrophy(CRPS), chronic nerve pain, failure of the healing process to go onto completion and other inherent risks of surgery The relavent the pathophysiology of the disease/injury process, as well as the alternatives for treatment and postoperative course of action has been discussed in great detail with the patient who desires to proceed.  We will do everything in our power to help you (the patient) restore function to the upper extremity. It is a pleasure to see this patient today.   Marirose Deveney L, PA-C 01/16/2016, 3:14 PM

## 2016-01-16 NOTE — Transfer of Care (Signed)
Immediate Anesthesia Transfer of Care Note  Patient: Holly Salazar  Procedure(s) Performed: Procedure(s): OPEN REDUCTION INTERNAL FIXATION (ORIF) LEFT  DISTAL RADIUS FRACTURE WITH REPAIR RECONSTRUCTION AS NEEDED  (Left)  Patient Location: PACU  Anesthesia Type:General and Regional  Level of Consciousness: awake, alert , oriented and patient cooperative  Airway & Oxygen Therapy: Patient Spontanous Breathing and Patient connected to face mask oxygen  Post-op Assessment: Report given to RN and Post -op Vital signs reviewed and stable  Post vital signs: Reviewed and stable  Last Vitals:  Filed Vitals:   01/16/16 1505 01/16/16 1731  BP: 138/34 98/80  Pulse: 86 79  Temp:  36.4 C  Resp: 15 16    Last Pain: There were no vitals filed for this visit.       Complications: No apparent anesthesia complications

## 2016-01-16 NOTE — Op Note (Signed)
NAMEMarland Salazar  Holly Salazar, Holly Salazar NO.:  [id number]  MEDICAL RECORD NO.:  [id number]  LOCATION:  [facility]                         FACILITY:  [facility]  PHYSICIAN:  [name]. Georgie Haque, M.D.DATE OF BIRTH:  [birth year]  DATE OF PROCEDURE: DATE OF DISCHARGE:                              OPERATIVE REPORT   PREOPERATIVE DIAGNOSIS:  Comminuted complex intra-articular distal radius fracture, left upper extremity, greater than 3-part.  POSTOPERATIVE DIAGNOSIS:  Comminuted complex intra-articular distal radius fracture, left upper extremity, greater than 3-part.  PROCEDURE: 1. Open reduction and internal fixation distal radius fracture     comminuted complex greater than 3-part with allograft bone graft     (StaGraft and DVR regular plate and screw fixation). 2. AP, lateral, and oblique stress x-rays performed, examined,     interpreted by myself. 3. Closed treatment ulnar styloid fracture.  SURGEON:  [name]. [name], MD  ASSISTANT:  Avelina Laine, PA-C  COMPLICATIONS:  None.  ANESTHESIA:  General with preoperative block.  TOURNIQUET TIME:  Less than an hour.  DRAINS:  One TLS drain.  ESTIMATED BLOOD LOSS:  Minimal.  INDICATIONS:  A pleasant 80 year old female, with comminuted fracture, presents for the above-mentioned surgical intervention.  I have discussed the risks and benefits, and she desires to proceed.  OPERATIVE PROCEDURE:  Patient was seen by myself and Anesthesia, taken to operative suite, underwent smooth induction of general anesthetic laid supine, fully padded, prepped and draped in usual sterile fashion with Betadine scrub and paint.  Preoperatively, Hibiclens scrub was performed by Mr. Jenean Lindau and a 10 minute surgical Betadine scrub was applied by Mr. Jenean Lindau, [facility].  Sterile field was secured.  Time-out observed.  Preoperative antibiotics were given.  She then underwent a volar radial incision.  She had a markedly displaced distal radius fracture.  We were  very careful with handling the soft tissues.  FCR tendon sheath was incised dorsally and palmarly.  Fasciotomy accomplished locally.  Following this, FCR was retracted and the carpal canal contents were pushed ulnarly with careful retraction.  Pronator was incised and the fracture was then accessed.  Following this, the patient then very carefully and cautiously had reduction applied. Following reduction, we then confirmed under x-ray, placed ample amounts of StaGraft, bone graft from [employer] and then applied our plate and screw construct.  The patient's distal radioulnar joint had to be evaluated very closely due to the fact that she had involvement with significant translation.  I took time and readjusted the plate during the course of the operation and checked the pronation and supination to make sure that she looked perfect before final implant placement.  I placed a combination of variable angle screws and locking screws proximally and of course pegs distally as we did not want to encroach upon the joint with anything sharp or the extensor tendons.  At the conclusion of the fixation, she had adequate radial height, inclination, and volar tilt. The flexion-extension of the wrist and pronation supination were soft and looked excellent.  There was no hardware protrusion.  All looked well.  Following this, the patient then underwent a very careful and cautious approach to the closure with closure of the pronator with Vicryl,  irrigation was applied.  Irrigation was then continued as we placed a TLS drain and closed the skin edge with Prolene.  A sterile dressing of Adaptic, Xeroform was applied.  Closed treatment of the ulna styloid fracture was performed due to the fact that there was no gross DRUJ instability.  The patient tolerated the procedure well.  There were no complicating features.  All sponge, needle, and instrument counts were reported as correct.  She was placed in a  long-arm splint in neutral position.  She will be admitted for IV antibiotics.  We will have PT see her for ambulation and make sure that she is stable for home environment.  She plans to go home, but we want to make sure that she meets criteria for this.  Should problems arise, she will notify us.     [name]. [name], M.D.     [facility]  D:  01/16/2016  T:  01/16/2016  Job:  [id number]

## 2016-01-17 ENCOUNTER — Encounter (HOSPITAL_COMMUNITY): Payer: Self-pay | Admitting: Orthopedic Surgery

## 2016-01-17 LAB — CBC WITH DIFFERENTIAL/PLATELET
Basophils Absolute: 0 10*3/uL (ref 0.0–0.1)
Basophils Relative: 0 %
EOS ABS: 0 10*3/uL (ref 0.0–0.7)
Eosinophils Relative: 0 %
HCT: 31.2 % — ABNORMAL LOW (ref 36.0–46.0)
HEMOGLOBIN: 10 g/dL — AB (ref 12.0–15.0)
LYMPHS ABS: 0.8 10*3/uL (ref 0.7–4.0)
LYMPHS PCT: 14 %
MCH: 29.7 pg (ref 26.0–34.0)
MCHC: 32.1 g/dL (ref 30.0–36.0)
MCV: 92.6 fL (ref 78.0–100.0)
MONOS PCT: 7 %
Monocytes Absolute: 0.4 10*3/uL (ref 0.1–1.0)
NEUTROS PCT: 79 %
Neutro Abs: 4.7 10*3/uL (ref 1.7–7.7)
Platelets: 232 10*3/uL (ref 150–400)
RBC: 3.37 MIL/uL — ABNORMAL LOW (ref 3.87–5.11)
RDW: 13.8 % (ref 11.5–15.5)
WBC: 6 10*3/uL (ref 4.0–10.5)

## 2016-01-17 LAB — BASIC METABOLIC PANEL
Anion gap: 5 (ref 5–15)
BUN: 13 mg/dL (ref 6–20)
CHLORIDE: 102 mmol/L (ref 101–111)
CO2: 32 mmol/L (ref 22–32)
CREATININE: 0.81 mg/dL (ref 0.44–1.00)
Calcium: 9 mg/dL (ref 8.9–10.3)
GFR calc Af Amer: >60 mL/min (ref >60)
GFR calc non Af Amer: >60 mL/min (ref >60)
GLUCOSE: 113 mg/dL — AB (ref 65–99)
Potassium: 4.3 mmol/L (ref 3.5–5.1)
Sodium: 139 mmol/L (ref 135–145)

## 2016-01-17 NOTE — Progress Notes (Signed)
Physical Therapy Treatment Patient Details Name: Lanaya Lawes MRN: [id number] DOB: 27-Jun-1936 Today's Date: 01/17/2016    History of Present Illness 80 y.o. female s/p ORIF L distal radius fx. PMH significant for HTN, osteoporosis, arthritis (knee, back), melanoma on back.    PT Comments    Pt seen for initial evaluation and treatment. At this time the patient and family are stating that they are planning to return home following her stay in the hospital. It was described that family (brother-in-law) will be checking on her and that they are arranging for 3-4 hours of care to be provided during the day. With mobility, the patient was able to ambulate 150 ft with [facility] and supervision. BERG score was 39, placing her in the significant risk for falls category. Functionally the pt did not have any loss of balance or instability during functional tasks. PT to continue to follow and further assess mobility and safety with D/C to home.   Follow Up Recommendations  Home health PT;Supervision - Intermittent     Equipment Recommendations  None recommended by PT    Recommendations for Other Services       Precautions / Restrictions Precautions Precautions: Fall Required Braces or Orthoses: Sling Restrictions Weight Bearing Restrictions: Yes LUE Weight Bearing: Weight bear through elbow only    Mobility  Bed Mobility Overal bed mobility: Needs Assistance Bed Mobility: Supine to Sit;Sit to Supine     Supine to sit: Independent Sit to supine: Independent   General bed mobility comments: bed flat, no rails, pt able to don/doff covers independently as well as adjust pillow to support LUE  Transfers Overall transfer level: Needs assistance Equipment used: Straight cane Transfers: Sit to/from Stand Sit to Stand: Supervision         General transfer comment: pt requiring use of Rt UE to perform transfer. Transfers performed from bed and chair (multiple transfers from chair).    Ambulation/Gait Ambulation/Gait assistance: Supervision Ambulation Distance (Feet): 150 Feet Assistive device: Straight cane Gait Pattern/deviations: Step-through pattern;Decreased step length - right;Decreased step length - left Gait velocity: decreased   General Gait Details: decreased stride length bilaterally but no loss of balance or instability noted. Pt consistent with use of SPC.    Stairs            Wheelchair Mobility    Modified Rankin (Stroke Patients Only)       Balance Overall balance assessment: Needs assistance Sitting-balance support: No upper extremity supported;Feet supported Sitting balance-Leahy Scale: Good     Standing balance support: Single extremity supported;During functional activity Standing balance-Leahy Scale: Poor Standing balance comment: Reliant on SPC or other surface for balance.                    Cognition Arousal/Alertness: Awake/alert Behavior During Therapy: WFL for tasks assessed/performed Overall Cognitive Status: Impaired/Different from baseline Area of Impairment: Following commands   Current Attention Level: Selective Memory: Decreased short-term memory Following Commands: Follows one step commands inconsistently Safety/Judgement: Decreased awareness of safety     General Comments: Pt having occasional difficulty understanding and following commands. Reorientation to task or topic needed intermittantly.     Exercises     General Comments        Pertinent Vitals/Pain Pain Assessment: Faces Pain Score: 4  Faces Pain Scale: Hurts little more Pain Location: Lt arm Pain Descriptors / Indicators: Sore Pain Intervention(s): Monitored during session;Limited activity within patient's tolerance    Home Living Family/patient expects to be discharged to::  Private residence Living Arrangements: Alone Available Help at Discharge: Family;Available PRN/intermittently Type of Home: Apartment Home Access: Level  entry   Home Layout: One level Home Equipment: Walker - 2 wheels;Cane - single point Additional Comments: Family reports that they will be checking on her intermittantly and are arranging for someone to stay with her for part of the day.     Prior Function Level of Independence: Independent with assistive device(s)      Comments: reports using SPC outside her home and intermittantly at home.    PT Goals (current goals can now be found in the care plan section) Acute Rehab PT Goals Patient Stated Goal: go home PT Goal Formulation: With patient Time For Goal Achievement: 01/31/16 Potential to Achieve Goals: Good    Frequency  Min 5X/week    PT Plan      Co-evaluation             End of Session Equipment Utilized During Treatment: Gait belt Activity Tolerance: Patient tolerated treatment well Patient left: in bed;with call bell/phone within reach (Lt UE supported)     Time: [id number] PT Time Calculation (min) (ACUTE ONLY): 45 min  Charges:  $Gait Training: 8-22 mins $Therapeutic Activity: 8-22 mins                    G Codes:      Cassell Clement, PT, CSCS Pager [phone] Office [phone]  01/17/2016, 2:41 PM

## 2016-01-17 NOTE — Progress Notes (Signed)
Patient ID: Holly Salazar, female   DOB: 1936/04/06, 80 y.o.   MRN: [id number] Patient is doing quite well.  I discussed with the patient's niece Olin Hauser her upper extremity predicament.  I would recommend that we continue elevation range of motion and see about letting her go home tomorrow. Will request home therapy however I discussed with the family this may or may not be approved. We're waiting PT evaluation to see how steady she is on her feet.  In the interest of her safety I think it would be best to go ahead and have an extra day in the hospital for pain control and therapeutic management  Patient has been seen and examined. Patient has pain appropriate to his injury/process. Patient denies new complaints at this present time. I have discussed the care pathway with nursing staff. Patient is appropriate and alert.  We reviewed vital signs and intake output which are stable.  The upper extremity is neurovascularly intact. Refill is normal. There is no signs of compartment syndrome. There is no signs of dystrophy. There is normal sensation.  I have spent a  great deal of time discussing range of motion edema control and other techniques to decrease edema and promote flexion extension of the fingers. Patient understands the importance of elevation range of motion massage and other measures to lessen pain and prevent swelling.  We have also discussed immobilization to appropriate areas involved.  We have discussed with the patient shoulder range of motion to prevent adhesive capsulitis.  The remainder of the examination is normal today without complicating feature.  Drain was removed without difficulty  Patient will be discharged home. Will plan to see the patient back in the office as per discharge instructions (please see discharge instructions).  Patient had an uneventful hospital course. At the time of discharge patient is stable awake alert and oriented in no acute distress. Regular  diet will be continued and has been tolerated. Patient will notify should have problems occur. There is no signs of DVT infection or other complication at this juncture.  All questions have been incurred and answered.  Please see discharge med list  Vickie Ponds MD

## 2016-01-17 NOTE — Progress Notes (Addendum)
Occupational Therapy Evaluation Patient Details Name: Holly Salazar MRN: [id number] DOB: 07/07/1936 Today's Date: 01/17/2016    History of Present Illness 80 y.o. female s/p ORIF L distal radius fx. PMH significant for HTN, osteoporosis, arthritis (knee, back), melanoma on back.   Clinical Impression   PTA, pt was independent with ADLs and used [facility] for mobility. Pt currently requires min-min guard assist for all ADLs and transfers. Pt demonstrated some cognitive deficits in attention, short-term memory and safety awareness - unsure if this is her baseline or if this is due to medication. Pt is a high fall risk and has significantly decreased safety and deficit awareness when discussing how she would perform IADLs such as grocery shopping and attending appointments. Educated pt to weight-bear only through elbow, HEP for L fingers and shoulder, edema management stratgies, and compensatory strategies for ADLs. At this time, recommend SNF for post-acute rehab stay due to pt's high fall risk and lack of social support. Will continue to follow acutely.    Follow Up Recommendations  SNF;Supervision/Assistance - 24 hour    Equipment Recommendations  3 in 1 bedside comode    Recommendations for Other Services       Precautions / Restrictions Precautions Precautions: Fall Required Braces or Orthoses: Sling Restrictions Weight Bearing Restrictions: Yes LUE Weight Bearing: Weight bear through elbow only      Mobility Bed Mobility Overal bed mobility: Needs Assistance Bed Mobility: Supine to Sit     Supine to sit: Min guard;HOB elevated     General bed mobility comments: HOB elevated, use of bedrails. Min guard for safety.  Transfers Overall transfer level: Needs assistance Equipment used: Straight cane Transfers: Sit to/from Stand Sit to Stand: Min assist;Min guard         General transfer comment: Min assist for inital sit-stand transfer and min guard for all subsequent  transfers. Verbal cues for safe hand placement on seated surfaces.    Balance Overall balance assessment: Needs assistance Sitting-balance support: No upper extremity supported;Feet supported Sitting balance-Leahy Scale: Good     Standing balance support: Single extremity supported;During functional activity Standing balance-Leahy Scale: Poor Standing balance comment: Reliant on SPC or other surface for balance.                   Berg Balance Test Sit to Stand: Able to stand  independently using hands Standing Unsupported: Able to stand safely 2 minutes Sitting with Back Unsupported but Feet Supported on Floor or Stool: Able to sit safely and securely 2 minutes Stand to Sit: Sits safely with minimal use of hands Transfers: Able to transfer safely, minor use of hands Standing Unsupported with Eyes Closed: Able to stand 10 seconds safely Standing Ubsupported with Feet Together: Able to place feet together independently and stand 1 minute safely From Standing, Reach Forward with Outstretched Arm: Can reach forward >12 cm safely (5") From Standing Position, Pick up Object from Floor: Able to pick up shoe, needs supervision From Standing Position, Turn to Look Behind Over each Shoulder: Turn sideways only but maintains balance Turn 360 Degrees: Able to turn 360 degrees safely but slowly Standing Unsupported, Alternately Place Feet on Step/Stool: Able to complete >2 steps/needs minimal assist Standing Unsupported, One Foot in Front: Needs help to step but can hold 15 seconds Standing on One Leg: Unable to try or needs assist to prevent fall Total Score: 39        ADL Overall ADL's : Needs assistance/impaired     Grooming: Wash/dry  hands;Min guard;Standing   Upper Body Bathing: Minimal assitance;Sitting   Lower Body Bathing: Minimal assistance;Sit to/from stand   Upper Body Dressing : Minimal assistance;Sitting   Lower Body Dressing: Minimal assistance;Sit to/from stand    Toilet Transfer: Min guard;Cueing for safety;Comfort height toilet;Ambulation [facility]) Toilet Transfer Details (indicate cue type and reason): cues for safe hand placement  Toileting- Clothing Manipulation and Hygiene: Min guard;Sit to/from stand       Functional mobility during ADLs: Min guard;Cane General ADL Comments: Educated pt on taking increased time for dressing/bathing tasks, edema management strategies, and HEP for wrist and shoulder.     Vision Vision Assessment?: No apparent visual deficits   Perception     Praxis      Pertinent Vitals/Pain Pain Assessment: 0-10 Pain Score: 4  Pain Location: L wrist Pain Descriptors / Indicators: Aching Pain Intervention(s): Limited activity within patient's tolerance;Monitored during session;Premedicated before session;Repositioned     Hand Dominance Right   Extremity/Trunk Assessment Upper Extremity Assessment Upper Extremity Assessment: LUE deficits/detail LUE Deficits / Details: ROM: shoulder wfl, elbow/wrist/fingers limited due to splint; STRNEGTH: generalized weakness LUE: Unable to fully assess due to immobilization LUE Coordination: decreased fine motor;decreased gross motor   Lower Extremity Assessment Lower Extremity Assessment: Defer to PT evaluation   Cervical / Trunk Assessment Cervical / Trunk Assessment: Kyphotic   Communication Communication Communication: No difficulties   Cognition Arousal/Alertness: Awake/alert Behavior During Therapy: WFL for tasks assessed/performed Overall Cognitive Status: Impaired/Different from baseline Area of Impairment: Attention;Memory;Following commands;Safety/judgement   Current Attention Level: Selective Memory: Decreased short-term memory Following Commands: Follows one step commands with increased time Safety/Judgement: Decreased awareness of safety     General Comments: Pt with difficulty completing sentences and repsonding to topic at hand when asked questions.    General Comments       Exercises Exercises: General Upper Extremity     Shoulder Instructions      Home Living Family/patient expects to be discharged to:: Private residence Living Arrangements: Alone Available Help at Discharge: Family;Available PRN/intermittently Type of Home: Apartment Home Access: Level entry     Home Layout: One level     Bathroom Shower/Tub: Walk-in shower;Door   Bathroom Toilet: Handicapped height     Home Equipment: Environmental consultant - 2 wheels;Cane - single point;Shower seat - built in;Hand held shower head          Prior Functioning/Environment Level of Independence: Independent with assistive device(s)        Comments: Uses SPC, drives    OT Diagnosis: Generalized weakness;Acute pain;Cognitive deficits   OT Problem List: Decreased strength;Decreased range of motion;Decreased activity tolerance;Impaired balance (sitting and/or standing);Decreased safety awareness;Decreased knowledge of use of DME or AE;Decreased knowledge of precautions;Decreased cognition;Pain;Impaired UE functional use   OT Treatment/Interventions: Self-care/ADL training;Therapeutic exercise;Energy conservation;DME and/or AE instruction;Therapeutic activities;Patient/family education;Balance training    OT Goals(Current goals can be found in the care plan section) Acute Rehab OT Goals Patient Stated Goal: to go home tomorrow OT Goal Formulation: With patient Time For Goal Achievement: 01/31/16 Potential to Achieve Goals: Good ADL Goals Pt Will Perform Upper Body Bathing: with modified independence;sitting Pt Will Perform Upper Body Dressing: with modified independence;sitting Pt Will Transfer to Toilet: with modified independence;ambulating;bedside commode (over toilet) Pt Will Perform Toileting - Clothing Manipulation and hygiene: with modified independence;sitting/lateral leans;sit to/from stand Pt Will Perform Tub/Shower Transfer: Shower transfer;with modified  independence;ambulating;shower seat Pt/caregiver will Perform Home Exercise Program: Increased ROM;Independently;With written HEP provided;Left upper extremity  OT Frequency: Min 2X/week   Barriers  to D/C: Decreased caregiver support  Lives alone and has intermittent assistance from her niece       Co-evaluation              End of Session Equipment Utilized During Treatment: Gait belt;Other (comment) [facility]) Nurse Communication: Mobility status;Weight bearing status  Activity Tolerance: Patient tolerated treatment well Patient left: in chair;with call bell/phone within reach;Other (comment) (LUE elevated)   Time: [id number] OT Time Calculation (min): 30 min Charges:  OT General Charges $OT Visit: 1 Procedure OT Evaluation $OT Eval Moderate Complexity: 1 Procedure OT Treatments $Therapeutic Exercise: 8-22 mins G-Codes:    Redmond Baseman, OTR/L Pager: [phone] 01/17/2016, 2:30 PM

## 2016-01-18 MED ORDER — HYDROCODONE-ACETAMINOPHEN 5-325 MG PO TABS: 2.0000 | ORAL_TABLET | Freq: Four times a day (QID) | ORAL | Status: DC | PRN

## 2016-01-18 NOTE — Care Management Note (Signed)
Case Management Note  Patient Details  Name: Holly Salazar MRN: [id number] Date of Birth: [birth year]  Subjective/Objective:  80 yo M s/p ORIF L distal radius fx.         Action/Plan: received referral to assist with HHPT, RN, OT, aide and a 3-in-BSC   Expected Discharge Date:     01/18/16             Expected Discharge Plan:  [place]  In-House Referral:     Discharge planning Services  CM Consult  Post Acute Care Choice:    Choice offered to:  Patient  DME Arranged:  3-N-1 DME Agency:  [place]:  RN, PT, OT, Nurse's Aide [place] Agency:  [facility]  Status of Service:  Completed, signed off  Medicare Important Message Given:    Date Medicare IM Given:    Medicare IM give by:    Date Additional Medicare IM Given:    Additional Medicare Important Message give by:     If discussed at [place] of Stay Meetings, dates discussed:    Additional Comments: met with pt at bedside. PT is recommending intermittent supervision. Pt lives alone. She plans to return home alone and she stated that she is able to manage. Discussed with pt going to a rehab facility but she declined. She stated that she had hip surgery in the past and she was able to manage. Her brother-in-law is going to help with her meals. She doesn't have a preference for a [place] agency. Provided pt with a list of [place] agencies. She wants to use [facility]. Contacted [facility] and [facility] for referrals.  Norina Buzzard, RN 01/18/2016, 1:09 PM

## 2016-01-18 NOTE — Progress Notes (Signed)
Physical Therapy Treatment Patient Details Name: Cona Detoro MRN: [id number] DOB: 04-02-1936 Today's Date: 2016-01-22    History of Present Illness 80 y.o. female s/p ORIF L distal radius fx. PMH significant for HTN, osteoporosis, arthritis (knee, back), melanoma on back.    PT Comments    Pt states she feels ready for DC home. Based on standardized balance testing from yesterday she is at risk for falls and this is likely not a new risk. Advised pt on safety at home and ways to minimize risk of falls.    Follow Up Recommendations  Home health PT;Supervision - Intermittent     Equipment Recommendations  None recommended by PT    Recommendations for Other Services       Precautions / Restrictions Precautions Precautions: Fall Required Braces or Orthoses: Sling (for LUE) Restrictions Weight Bearing Restrictions: Yes LUE Weight Bearing: Weight bear through elbow only    Mobility  Bed Mobility Overal bed mobility: Needs Assistance Bed Mobility: Supine to Sit     Supine to sit: Independent     General bed mobility comments: bed flat, no rails, pt able to don/doff covers independently as well as adjust pillow to support LUE  Transfers Overall transfer level: Needs assistance Equipment used: Straight cane Transfers: Sit to/from Stand Sit to Stand: Supervision         General transfer comment: Took several attempts with rocking. Pt reports this is baseline  Ambulation/Gait Ambulation/Gait assistance: Supervision Ambulation Distance (Feet): 200 Feet Assistive device: Straight cane Gait Pattern/deviations: Step-through pattern;Decreased stride length Gait velocity: decreased   General Gait Details: No balance losses   Stairs            Wheelchair Mobility    Modified Rankin (Stroke Patients Only)       Balance     Sitting balance-Leahy Scale: Good       Standing balance-Leahy Scale: Good                      Cognition  Arousal/Alertness: Awake/alert Behavior During Therapy: WFL for tasks assessed/performed Overall Cognitive Status: Within Functional Limits for tasks assessed                 General Comments: No difficulties following commands today    Exercises      General Comments        Pertinent Vitals/Pain Pain Assessment: No/denies pain Faces Pain Scale: Hurts little more    Home Living                      Prior Function            PT Goals (current goals can now be found in the care plan section) Acute Rehab PT Goals Patient Stated Goal: go home today Progress towards PT goals: Progressing toward goals    Frequency  Min 5X/week    PT Plan Current plan remains appropriate    Co-evaluation             End of Session Equipment Utilized During Treatment: Gait belt Activity Tolerance: Patient tolerated treatment well Patient left: with call bell/phone within reach;in chair;with chair alarm set (Lt UE supported)     Time: [id number] PT Time Calculation (min) (ACUTE ONLY): 17 min  Charges:  $Gait Training: 8-22 mins                    G Codes:      Melvern Banker 2016/01/22,  9:26 AM Lavonia Dana, PT  [phone] 01/18/2016

## 2016-01-18 NOTE — Progress Notes (Signed)
Occupational Therapy Treatment Patient Details Name: Holly Salazar MRN: [id number] DOB: 1936/08/06 Today's Date: 01/18/2016    History of present illness 80 y.o. female s/p ORIF L distal radius fx. PMH significant for HTN, osteoporosis, arthritis (knee, back), melanoma on back.   OT comments  Pt has made good progress, but continues to be a high fall risk and strongly encouraged pt to use SPC at all times. Pt able to complete bathing and dressing tasks with min assist and functional transfers with min guard assist for safety. Reviewed HEP for LUE and edema management strategies as well. Pt  All education has been completed and pt has no further questions. Updated discharge plan to reflect pt's progress  - currently recommend HHOT upon d/c and 3in1 for home use. Pt with no further acute OT needs OT signing off.   Follow Up Recommendations  Home health OT;Supervision - Intermittent    Equipment Recommendations  3 in 1 bedside comode    Recommendations for Other Services      Precautions / Restrictions Precautions Precautions: Fall Required Braces or Orthoses: Sling Restrictions Weight Bearing Restrictions: Yes LUE Weight Bearing: Weight bear through elbow only       Mobility Bed Mobility Overal bed mobility: Needs Assistance Bed Mobility: Supine to Sit     Supine to sit: Independent     General bed mobility comments: Pt sitting EOB on OT arrival  Transfers Overall transfer level: Needs assistance Equipment used: Straight cane Transfers: Sit to/from Stand Sit to Stand: Min guard         General transfer comment: Min guard assist for safety. Pt with decreased safety awareness and required verbal cues.     Balance Overall balance assessment: Needs assistance Sitting-balance support: No upper extremity supported;Feet supported Sitting balance-Leahy Scale: Good     Standing balance support: Single extremity supported;During functional activity Standing balance-Leahy  Scale: Fair Standing balance comment: Pt still demonstrates unsteadiness and suhffling gait when completing dyanmic balance tasks                   ADL Overall ADL's : Needs assistance/impaired                 Upper Body Dressing : Minimal assistance;Sitting;Cueing for compensatory techniques Upper Body Dressing Details (indicate cue type and reason): Cues to dress RUE first and undress it last, assist for donning bra and sling  Lower Body Dressing: Min guard;Sit to/from stand   Toilet Transfer: Min guard;Cueing for safety;Ambulation [facility]) Toilet Transfer Details (indicate cue type and reason): cues to move slowly and for safe hand placement Toileting- Clothing Manipulation and Hygiene: Min guard;Sit to/from stand       Functional mobility during ADLs: Min guard;Cane General ADL Comments: Reviewed edema management strategies and HEP for LUE. Educated pt on compensatory strategies for UB ADLs. No family present for OT session.      Vision                     Perception     Praxis      Cognition   Behavior During Therapy: [facility] for tasks assessed/performed Overall Cognitive Status: No family/caregiver present to determine baseline cognitive functioning Area of Impairment: Safety/judgement;Memory;Problem solving     Memory: Decreased short-term memory    Safety/Judgement: Decreased awareness of safety   Problem Solving: Slow processing;Difficulty sequencing;Requires verbal cues General Comments: Pt unable to recall any exercises taught in previous OT sessions. Pt also continues to have difficulty repsonding  to the topic at hand when asked questions and continues to recall stories from when she lived [place] and about her sister who has dementia.     Extremity/Trunk Assessment               Exercises     Shoulder Instructions       General Comments      Pertinent Vitals/ Pain       Pain Assessment: No/denies pain Faces Pain Scale: Hurts  little more  Home Living                                          Prior Functioning/Environment              Frequency       Progress Toward Goals  OT Goals(current goals can now be found in the care plan section)  Progress towards OT goals: Goals met/education completed, patient discharged from OT  Acute Rehab OT Goals Patient Stated Goal: go home today OT Goal Formulation: With patient Time For Goal Achievement: 01/31/16 Potential to Achieve Goals: Good ADL Goals Pt Will Perform Upper Body Bathing: with modified independence;sitting Pt Will Perform Upper Body Dressing: with modified independence;sitting Pt Will Transfer to Toilet: with modified independence;ambulating;bedside commode Pt Will Perform Toileting - Clothing Manipulation and hygiene: with modified independence;sitting/lateral leans;sit to/from stand Pt Will Perform Tub/Shower Transfer: Shower transfer;with modified independence;ambulating;shower seat Pt/caregiver will Perform Home Exercise Program: Increased ROM;Independently;With written HEP provided;Left upper extremity  Plan All goals met and education completed, patient discharged from OT services    Co-evaluation                 End of Session Equipment Utilized During Treatment: Gait belt (SPC and sling)   Activity Tolerance Patient tolerated treatment well   Patient Left in chair;with call bell/phone within reach;with chair alarm set   Nurse Communication Mobility status        Time: 3685-9923 OT Time Calculation (min): 21 min  Charges: OT General Charges $OT Visit: 1 Procedure OT Treatments $Self Care/Home Management : 8-22 mins  Redmond Baseman, OTR/L Pager: [phone] 01/18/2016, 11:28 AM

## 2016-01-18 NOTE — Progress Notes (Signed)
Pt discharge education and instructions completed with pt. Pt discharge home with family to transport her home. Pt IV removed; cast and sling remains on; pt family refused 3-in-1 stating they have one already at home. Charge RN aware and equipment left in equipment room; pt transported off unit via wheelchair with belongings to the side. Francis Gaines Shem Plemmons RN.

## 2016-01-18 NOTE — Discharge Summary (Signed)
Physician Discharge Summary  Patient ID: Holly Salazar MRN: [id number] DOB/AGE: November 13, 1935 80 y.o.  Admit date: 01/16/2016 Discharge date:   Admission Diagnoses: LEFT COMMUNITED DISTAL RADIUS FRACTURE  Past Medical History  Diagnosis Date  . Hypertension   . Osteoporosis   . Arthritis     knee and back  . Cancer ([place])     melanoma on back    Discharge Diagnoses:  Active Problems:   Radius and ulna distal fracture   Surgeries: Procedure(s): OPEN REDUCTION INTERNAL FIXATION (ORIF) LEFT  DISTAL RADIUS FRACTURE WITH REPAIR RECONSTRUCTION AS NEEDED  on 01/16/2016    Consultants:    Discharged Condition: Improved  Hospital Course: Holly Salazar is an 80 y.o. female who was admitted 01/16/2016 with a chief complaint of No chief complaint on file. , and found to have a diagnosis of LEFT COMMUNITED DISTAL RADIUS FRACTURE .  They were brought to the operating room on 01/16/2016 and underwent Procedure(s): OPEN REDUCTION INTERNAL FIXATION (ORIF) LEFT  DISTAL RADIUS FRACTURE WITH REPAIR RECONSTRUCTION AS NEEDED .    They were given perioperative antibiotics: Anti-infectives    Start     Dose/Rate Route Frequency Ordered Stop   01/17/16 0500  ceFAZolin (ANCEF) IVPB 1 g/50 mL premix     1 g 100 mL/hr over 30 Minutes Intravenous Every 8 hours 01/16/16 2049     01/16/16 2100  ceFAZolin (ANCEF) IVPB 1 g/50 mL premix     1 g 100 mL/hr over 30 Minutes Intravenous NOW 01/16/16 2049 01/16/16 2315   01/16/16 1315  ceFAZolin (ANCEF) IVPB 2g/100 mL premix     2 g 200 mL/hr over 30 Minutes Intravenous On call to O.R. 01/16/16 1302 01/16/16 1605   01/16/16 1302  ceFAZolin (ANCEF) 2-4 GM/100ML-% IVPB    Comments:  Sammuel Cooper   : cabinet override      01/16/16 1302 01/17/16 0114    .  They were given sequential compression devices, early ambulation, for DVT prophylaxis.  Recent vital signs: Patient Vitals for the past 24 hrs:  BP Temp Temp src Pulse Resp SpO2  01/18/16 0351 (!) 114/43  mmHg 97.4 F (36.3 C) Oral 74 16 100 %  01/17/16 1949 (!) 105/37 mmHg 100 F (37.8 C) Oral 77 16 98 %  01/17/16 1629 (!) 127/46 mmHg 99.3 F (37.4 C) Oral 71 16 98 %  .  Recent laboratory studies: No results found.  Discharge Medications:     Medication List    ASK your doctor about these medications        alendronate 70 MG tablet  Commonly known as:  FOSAMAX  Take 70 mg by mouth every Saturday. Take with a full glass of water on an empty stomach.     HYDROcodone-acetaminophen 5-325 MG tablet  Commonly known as:  NORCO/VICODIN  Take 1-2 tablets by mouth every 6 (six) hours as needed for severe pain.     lisinopril 20 MG tablet  Commonly known as:  PRINIVIL,ZESTRIL  Take 20 mg by mouth daily.     multivitamin with minerals Tabs tablet  Take 1 tablet by mouth daily.     naproxen sodium 220 MG tablet  Commonly known as:  ANAPROX  Take 220 mg by mouth daily as needed (pain).        Diagnostic Studies: Dg Forearm Left  01/07/2016  CLINICAL DATA:  Left wrist fracture status post fall. EXAM: LEFT FOREARM - 2 VIEW COMPARISON:  None. FINDINGS: There is a impacted dorsally displaced distal radial  metaphysis fracture. There is a nondisplaced fracture at the base of the ulnar styloid process. There is no other fracture or dislocation. There is no elbow joint effusion. There are calcifications adjacent to the radial tuberosity in the region of the biceps tendon most concerning for calcific tendinosis. IMPRESSION: 1. Impacted dorsally displaced distal radial metaphysis fracture. 2. Nondisplaced fracture at the base of the ulnar styloid process. Electronically Signed   By: Kathreen Devoid   On: 01/07/2016 14:21   Dg Wrist Complete Left  01/07/2016  CLINICAL DATA:  80 year old female with left wrist pain sustained following a fall at her primary care physician's office. EXAM: LEFT WRIST - COMPLETE 3+ VIEW COMPARISON:  None. FINDINGS: Acute impacted and dorsally displaced distal radius  fracture with intra-articular involvement. Additionally, there is a mildly displaced fracture of the ulnar styloid. The bones appear diffusely osteopenic. There is calcification in the region of the triangular fibrocartilage. The scaphoid and carpus appear intact. Small subchondral cysts present in the base of the capitate. There is soft tissue swelling along the dorsal aspect of the wrist. IMPRESSION: 1. Impacted and dorsally displaced distal radius fracture with intra-articular involvement. 2. Minimally displaced ulnar styloid fracture. 3. The bones appear osteopenic. Electronically Signed   By: Jacqulynn Cadet M.D.   On: 01/07/2016 13:48    They benefited maximally from their hospital stay and there were no complications.    Patient did quite well with her postop care. She was awake alert and oriented to time of discharge. I discussed her issues with physical therapy who felt that she was at her baseline. I strongly recommended continued cane use for all and do a tour he measures. She understands this. Disposition: 01-Home or Self Care      Follow-up Information    Follow up with Paulene Floor, MD In 12 days.   Specialty:  Orthopedic Surgery   Why:  Please call [phone] to see Dr. [name] in 12 days   Contact information:   [address] [place] [address] [id number]        Signed: Paulene Floor 01/18/2016, 10:04 AM

## 2016-01-18 NOTE — Discharge Instructions (Signed)
Please keep your bandage clean and dry.  Please usually or cane at all times when walking for balance.  Please notify should and problems occur.  Please see Dr. [name] in 12 days for your follow-up  Keep bandage clean and dry.  Call for any problems.  No smoking.  Criteria for driving a car: you should be off your pain medicine for 7-8 hours, able to drive one handed(confident), thinking clearly and feeling able in your judgement to drive. Continue elevation as it will decrease swelling.  If instructed by MD move your fingers within the confines of the bandage/splint.  Use ice if instructed by your MD. Call immediately for any sudden loss of feeling in your hand/arm or change in functional abilities of the extremity.We recommend that you to take vitamin C 1000 mg a day to promote healing. We also recommend that if you require  pain medicine that you take a stool softener to prevent constipation as most pain medicines will have constipation side effects. We recommend either Peri-Colace or Senokot and recommend that you also consider adding MiraLAX as well to prevent the constipation affects from pain medicine if you are required to use them. These medicines are over the counter and may be purchased at a local pharmacy. A cup of yogurt and a probiotic can also be helpful during the recovery process as the medicines can disrupt your intestinal environment.

## 2016-01-20 DIAGNOSIS — I10 Essential (primary) hypertension: Secondary | ICD-10-CM | POA: Diagnosis not present

## 2016-01-20 DIAGNOSIS — M4806 Spinal stenosis, lumbar region: Secondary | ICD-10-CM | POA: Diagnosis not present

## 2016-01-20 DIAGNOSIS — M170 Bilateral primary osteoarthritis of knee: Secondary | ICD-10-CM | POA: Diagnosis not present

## 2016-01-20 DIAGNOSIS — S52502D Unspecified fracture of the lower end of left radius, subsequent encounter for closed fracture with routine healing: Secondary | ICD-10-CM | POA: Diagnosis not present

## 2016-01-20 DIAGNOSIS — M810 Age-related osteoporosis without current pathological fracture: Secondary | ICD-10-CM | POA: Diagnosis not present

## 2016-01-23 DIAGNOSIS — M4806 Spinal stenosis, lumbar region: Secondary | ICD-10-CM | POA: Diagnosis not present

## 2016-01-23 DIAGNOSIS — I10 Essential (primary) hypertension: Secondary | ICD-10-CM | POA: Diagnosis not present

## 2016-01-23 DIAGNOSIS — M170 Bilateral primary osteoarthritis of knee: Secondary | ICD-10-CM | POA: Diagnosis not present

## 2016-01-23 DIAGNOSIS — M810 Age-related osteoporosis without current pathological fracture: Secondary | ICD-10-CM | POA: Diagnosis not present

## 2016-01-23 DIAGNOSIS — S52502D Unspecified fracture of the lower end of left radius, subsequent encounter for closed fracture with routine healing: Secondary | ICD-10-CM | POA: Diagnosis not present

## 2016-01-28 DIAGNOSIS — M4806 Spinal stenosis, lumbar region: Secondary | ICD-10-CM | POA: Diagnosis not present

## 2016-01-28 DIAGNOSIS — S52502D Unspecified fracture of the lower end of left radius, subsequent encounter for closed fracture with routine healing: Secondary | ICD-10-CM | POA: Diagnosis not present

## 2016-01-28 DIAGNOSIS — M170 Bilateral primary osteoarthritis of knee: Secondary | ICD-10-CM | POA: Diagnosis not present

## 2016-01-28 DIAGNOSIS — M810 Age-related osteoporosis without current pathological fracture: Secondary | ICD-10-CM | POA: Diagnosis not present

## 2016-01-28 DIAGNOSIS — I10 Essential (primary) hypertension: Secondary | ICD-10-CM | POA: Diagnosis not present

## 2016-01-31 DIAGNOSIS — M170 Bilateral primary osteoarthritis of knee: Secondary | ICD-10-CM | POA: Diagnosis not present

## 2016-01-31 DIAGNOSIS — S52502D Unspecified fracture of the lower end of left radius, subsequent encounter for closed fracture with routine healing: Secondary | ICD-10-CM | POA: Diagnosis not present

## 2016-01-31 DIAGNOSIS — M810 Age-related osteoporosis without current pathological fracture: Secondary | ICD-10-CM | POA: Diagnosis not present

## 2016-01-31 DIAGNOSIS — I10 Essential (primary) hypertension: Secondary | ICD-10-CM | POA: Diagnosis not present

## 2016-01-31 DIAGNOSIS — M4806 Spinal stenosis, lumbar region: Secondary | ICD-10-CM | POA: Diagnosis not present

## 2016-02-03 DIAGNOSIS — M810 Age-related osteoporosis without current pathological fracture: Secondary | ICD-10-CM | POA: Diagnosis not present

## 2016-02-03 DIAGNOSIS — S52502D Unspecified fracture of the lower end of left radius, subsequent encounter for closed fracture with routine healing: Secondary | ICD-10-CM | POA: Diagnosis not present

## 2016-02-03 DIAGNOSIS — M4806 Spinal stenosis, lumbar region: Secondary | ICD-10-CM | POA: Diagnosis not present

## 2016-02-03 DIAGNOSIS — I10 Essential (primary) hypertension: Secondary | ICD-10-CM | POA: Diagnosis not present

## 2016-02-03 DIAGNOSIS — M170 Bilateral primary osteoarthritis of knee: Secondary | ICD-10-CM | POA: Diagnosis not present

## 2016-02-06 DIAGNOSIS — M810 Age-related osteoporosis without current pathological fracture: Secondary | ICD-10-CM | POA: Diagnosis not present

## 2016-02-06 DIAGNOSIS — I10 Essential (primary) hypertension: Secondary | ICD-10-CM | POA: Diagnosis not present

## 2016-02-06 DIAGNOSIS — M4806 Spinal stenosis, lumbar region: Secondary | ICD-10-CM | POA: Diagnosis not present

## 2016-02-06 DIAGNOSIS — M170 Bilateral primary osteoarthritis of knee: Secondary | ICD-10-CM | POA: Diagnosis not present

## 2016-02-06 DIAGNOSIS — S52502D Unspecified fracture of the lower end of left radius, subsequent encounter for closed fracture with routine healing: Secondary | ICD-10-CM | POA: Diagnosis not present

## 2016-02-10 DIAGNOSIS — S62102D Fracture of unspecified carpal bone, left wrist, subsequent encounter for fracture with routine healing: Secondary | ICD-10-CM | POA: Diagnosis not present

## 2016-02-10 DIAGNOSIS — Z4789 Encounter for other orthopedic aftercare: Secondary | ICD-10-CM | POA: Diagnosis not present

## 2016-02-14 DIAGNOSIS — S62102D Fracture of unspecified carpal bone, left wrist, subsequent encounter for fracture with routine healing: Secondary | ICD-10-CM | POA: Diagnosis not present

## 2016-02-18 DIAGNOSIS — R269 Unspecified abnormalities of gait and mobility: Secondary | ICD-10-CM | POA: Diagnosis not present

## 2016-02-19 DIAGNOSIS — S62102D Fracture of unspecified carpal bone, left wrist, subsequent encounter for fracture with routine healing: Secondary | ICD-10-CM | POA: Diagnosis not present

## 2016-02-24 DIAGNOSIS — S62102D Fracture of unspecified carpal bone, left wrist, subsequent encounter for fracture with routine healing: Secondary | ICD-10-CM | POA: Diagnosis not present

## 2016-02-26 DIAGNOSIS — R269 Unspecified abnormalities of gait and mobility: Secondary | ICD-10-CM | POA: Diagnosis not present

## 2016-02-28 DIAGNOSIS — S62102D Fracture of unspecified carpal bone, left wrist, subsequent encounter for fracture with routine healing: Secondary | ICD-10-CM | POA: Diagnosis not present

## 2016-02-28 DIAGNOSIS — S62102A Fracture of unspecified carpal bone, left wrist, initial encounter for closed fracture: Secondary | ICD-10-CM | POA: Diagnosis not present

## 2016-03-03 DIAGNOSIS — H3561 Retinal hemorrhage, right eye: Secondary | ICD-10-CM | POA: Diagnosis not present

## 2016-03-03 DIAGNOSIS — H35033 Hypertensive retinopathy, bilateral: Secondary | ICD-10-CM | POA: Diagnosis not present

## 2016-03-03 DIAGNOSIS — H2513 Age-related nuclear cataract, bilateral: Secondary | ICD-10-CM | POA: Diagnosis not present

## 2016-03-04 DIAGNOSIS — S62102D Fracture of unspecified carpal bone, left wrist, subsequent encounter for fracture with routine healing: Secondary | ICD-10-CM | POA: Diagnosis not present

## 2016-03-06 DIAGNOSIS — S62102D Fracture of unspecified carpal bone, left wrist, subsequent encounter for fracture with routine healing: Secondary | ICD-10-CM | POA: Diagnosis not present

## 2016-03-10 DIAGNOSIS — S62102D Fracture of unspecified carpal bone, left wrist, subsequent encounter for fracture with routine healing: Secondary | ICD-10-CM | POA: Diagnosis not present

## 2016-03-13 DIAGNOSIS — S62102D Fracture of unspecified carpal bone, left wrist, subsequent encounter for fracture with routine healing: Secondary | ICD-10-CM | POA: Diagnosis not present

## 2016-03-17 DIAGNOSIS — S62102D Fracture of unspecified carpal bone, left wrist, subsequent encounter for fracture with routine healing: Secondary | ICD-10-CM | POA: Diagnosis not present

## 2016-03-20 DIAGNOSIS — S62102A Fracture of unspecified carpal bone, left wrist, initial encounter for closed fracture: Secondary | ICD-10-CM | POA: Diagnosis not present

## 2016-03-23 DIAGNOSIS — S62102D Fracture of unspecified carpal bone, left wrist, subsequent encounter for fracture with routine healing: Secondary | ICD-10-CM | POA: Diagnosis not present

## 2016-03-26 DIAGNOSIS — Z4789 Encounter for other orthopedic aftercare: Secondary | ICD-10-CM | POA: Diagnosis not present

## 2016-04-01 DIAGNOSIS — S62102D Fracture of unspecified carpal bone, left wrist, subsequent encounter for fracture with routine healing: Secondary | ICD-10-CM | POA: Diagnosis not present

## 2016-04-03 DIAGNOSIS — S62102D Fracture of unspecified carpal bone, left wrist, subsequent encounter for fracture with routine healing: Secondary | ICD-10-CM | POA: Diagnosis not present

## 2016-04-22 DIAGNOSIS — H35033 Hypertensive retinopathy, bilateral: Secondary | ICD-10-CM | POA: Diagnosis not present

## 2016-04-23 DIAGNOSIS — Z4789 Encounter for other orthopedic aftercare: Secondary | ICD-10-CM | POA: Diagnosis not present

## 2016-05-11 DIAGNOSIS — I739 Peripheral vascular disease, unspecified: Secondary | ICD-10-CM | POA: Diagnosis not present

## 2016-05-11 DIAGNOSIS — L603 Nail dystrophy: Secondary | ICD-10-CM | POA: Diagnosis not present

## 2017-01-28 DIAGNOSIS — Z85820 Personal history of malignant melanoma of skin: Secondary | ICD-10-CM | POA: Diagnosis not present

## 2017-01-28 DIAGNOSIS — I10 Essential (primary) hypertension: Secondary | ICD-10-CM | POA: Diagnosis not present

## 2017-01-28 DIAGNOSIS — M810 Age-related osteoporosis without current pathological fracture: Secondary | ICD-10-CM | POA: Diagnosis not present

## 2017-01-28 DIAGNOSIS — R2681 Unsteadiness on feet: Secondary | ICD-10-CM | POA: Diagnosis not present

## 2017-01-28 DIAGNOSIS — Z8781 Personal history of (healed) traumatic fracture: Secondary | ICD-10-CM | POA: Diagnosis not present

## 2017-03-22 DIAGNOSIS — H35363 Drusen (degenerative) of macula, bilateral: Secondary | ICD-10-CM | POA: Diagnosis not present

## 2017-08-09 IMAGING — CR DG FOREARM 2V*L*
3 series · 3 of 3 positions shown · non-contrast
Comparison: None.

CLINICAL DATA: Left wrist fracture status post fall.

EXAM:
LEFT FOREARM - 2 VIEW

[x forearm ap left]
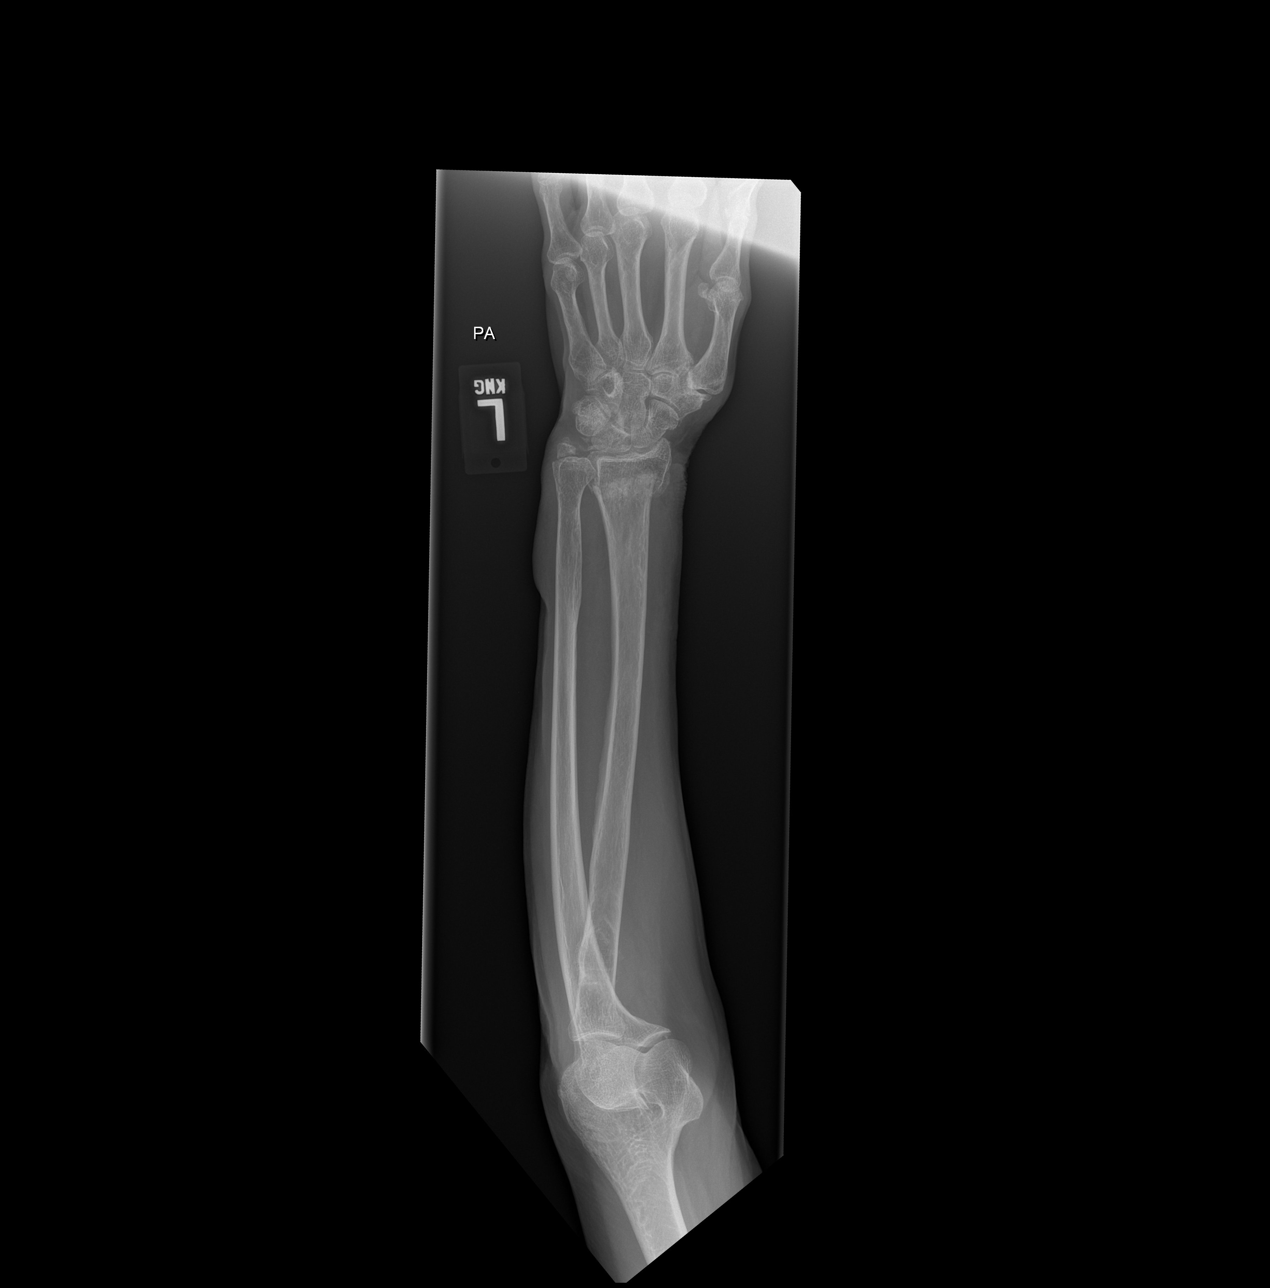

[x forearm lat left (1 of 2)]
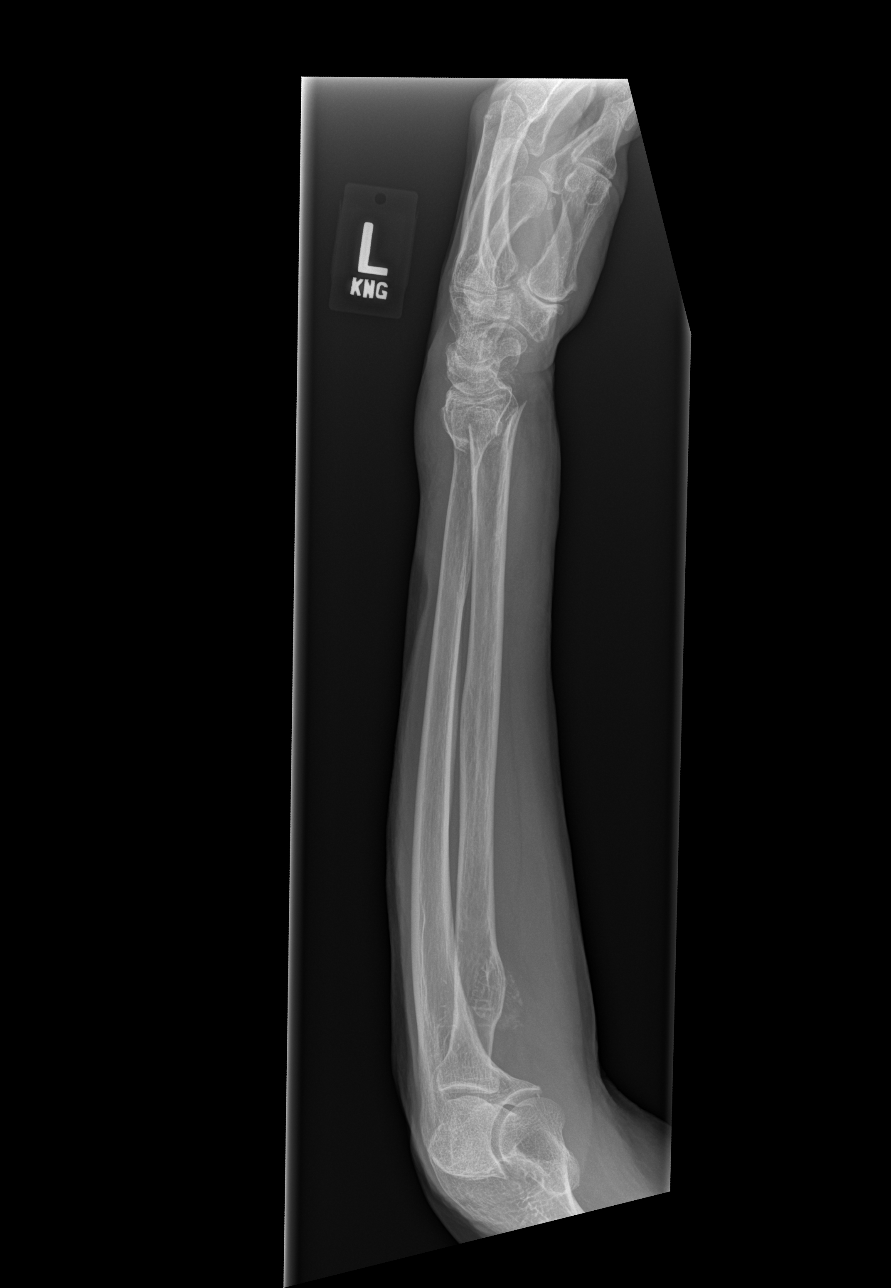

[x forearm lat left (2 of 2)]
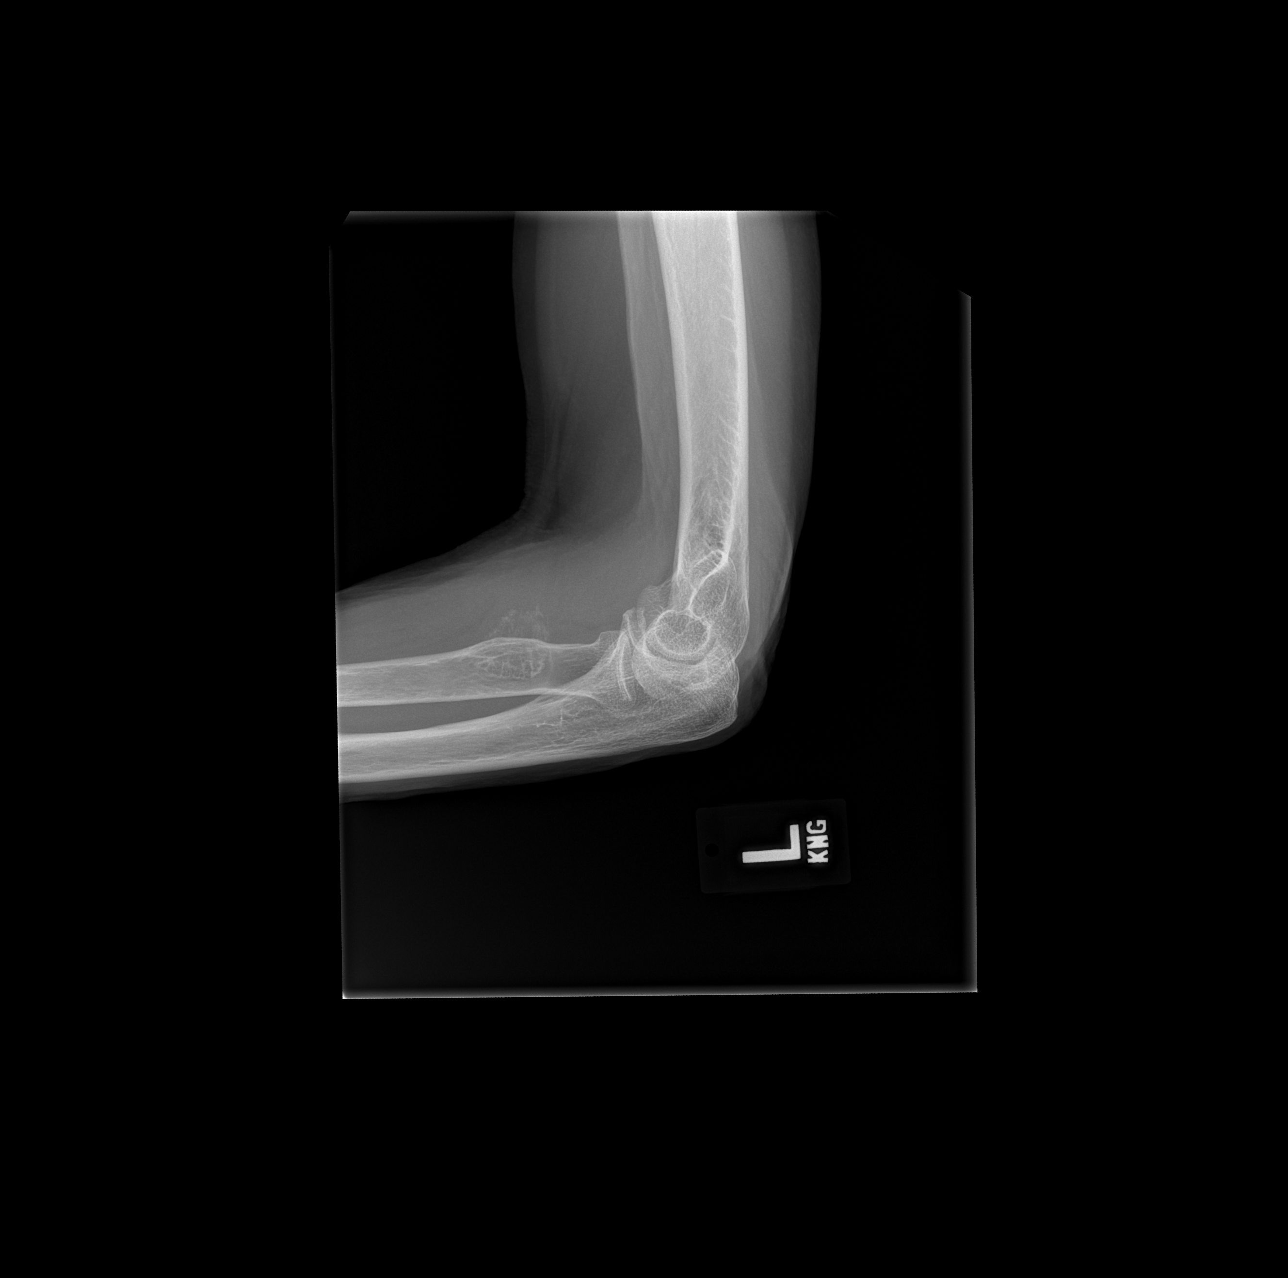

[3 of 3 positions shown; findings below may reference images not displayed]

FINDINGS: There is a impacted dorsally displaced distal radial metaphysis
fracture. There is a nondisplaced fracture at the base of the ulnar
styloid process. There is no other fracture or dislocation. There is
no elbow joint effusion. There are calcifications adjacent to the
radial tuberosity in the region of the biceps tendon most concerning
for calcific tendinosis.
IMPRESSION: 1. Impacted dorsally displaced distal radial metaphysis fracture.
2. Nondisplaced fracture at the base of the ulnar styloid process.

## 2018-01-31 DIAGNOSIS — I10 Essential (primary) hypertension: Secondary | ICD-10-CM | POA: Diagnosis not present

## 2018-01-31 DIAGNOSIS — E559 Vitamin D deficiency, unspecified: Secondary | ICD-10-CM | POA: Diagnosis not present

## 2018-01-31 DIAGNOSIS — Z6820 Body mass index (BMI) 20.0-20.9, adult: Secondary | ICD-10-CM | POA: Diagnosis not present

## 2018-01-31 DIAGNOSIS — M810 Age-related osteoporosis without current pathological fracture: Secondary | ICD-10-CM | POA: Diagnosis not present

## 2018-03-23 DIAGNOSIS — H35711 Central serous chorioretinopathy, right eye: Secondary | ICD-10-CM | POA: Diagnosis not present

## 2018-03-23 DIAGNOSIS — H524 Presbyopia: Secondary | ICD-10-CM | POA: Diagnosis not present

## 2018-04-18 DIAGNOSIS — H35711 Central serous chorioretinopathy, right eye: Secondary | ICD-10-CM | POA: Diagnosis not present

## 2018-05-05 DIAGNOSIS — H43813 Vitreous degeneration, bilateral: Secondary | ICD-10-CM | POA: Diagnosis not present

## 2018-05-05 DIAGNOSIS — H353132 Nonexudative age-related macular degeneration, bilateral, intermediate dry stage: Secondary | ICD-10-CM | POA: Diagnosis not present

## 2018-06-07 DIAGNOSIS — M810 Age-related osteoporosis without current pathological fracture: Secondary | ICD-10-CM | POA: Diagnosis not present

## 2018-06-07 DIAGNOSIS — H3530 Unspecified macular degeneration: Secondary | ICD-10-CM | POA: Diagnosis not present

## 2018-06-07 DIAGNOSIS — Z0000 Encounter for general adult medical examination without abnormal findings: Secondary | ICD-10-CM | POA: Diagnosis not present

## 2018-06-07 DIAGNOSIS — I10 Essential (primary) hypertension: Secondary | ICD-10-CM | POA: Diagnosis not present

## 2018-06-07 DIAGNOSIS — Z1389 Encounter for screening for other disorder: Secondary | ICD-10-CM | POA: Diagnosis not present

## 2018-06-07 DIAGNOSIS — Z9989 Dependence on other enabling machines and devices: Secondary | ICD-10-CM | POA: Diagnosis not present

## 2018-06-07 DIAGNOSIS — R2681 Unsteadiness on feet: Secondary | ICD-10-CM | POA: Diagnosis not present

## 2018-06-09 DIAGNOSIS — H353132 Nonexudative age-related macular degeneration, bilateral, intermediate dry stage: Secondary | ICD-10-CM | POA: Diagnosis not present

## 2018-08-11 DIAGNOSIS — H353132 Nonexudative age-related macular degeneration, bilateral, intermediate dry stage: Secondary | ICD-10-CM | POA: Diagnosis not present

## 2018-08-11 DIAGNOSIS — H31103 Choroidal degeneration, unspecified, bilateral: Secondary | ICD-10-CM | POA: Diagnosis not present

## 2018-08-11 DIAGNOSIS — H43813 Vitreous degeneration, bilateral: Secondary | ICD-10-CM | POA: Diagnosis not present

## 2019-04-04 DIAGNOSIS — H35363 Drusen (degenerative) of macula, bilateral: Secondary | ICD-10-CM | POA: Diagnosis not present

## 2019-05-29 DIAGNOSIS — Z23 Encounter for immunization: Secondary | ICD-10-CM | POA: Diagnosis not present

## 2019-06-16 DIAGNOSIS — R634 Abnormal weight loss: Secondary | ICD-10-CM | POA: Diagnosis not present

## 2019-06-16 DIAGNOSIS — M810 Age-related osteoporosis without current pathological fracture: Secondary | ICD-10-CM | POA: Diagnosis not present

## 2019-06-16 DIAGNOSIS — I10 Essential (primary) hypertension: Secondary | ICD-10-CM | POA: Diagnosis not present

## 2019-06-16 DIAGNOSIS — R4589 Other symptoms and signs involving emotional state: Secondary | ICD-10-CM | POA: Diagnosis not present

## 2019-06-16 DIAGNOSIS — Z681 Body mass index (BMI) 19 or less, adult: Secondary | ICD-10-CM | POA: Diagnosis not present

## 2019-06-16 DIAGNOSIS — Z8781 Personal history of (healed) traumatic fracture: Secondary | ICD-10-CM | POA: Diagnosis not present

## 2019-06-16 DIAGNOSIS — Z85820 Personal history of malignant melanoma of skin: Secondary | ICD-10-CM | POA: Diagnosis not present

## 2019-06-16 DIAGNOSIS — Z0000 Encounter for general adult medical examination without abnormal findings: Secondary | ICD-10-CM | POA: Diagnosis not present

## 2019-10-24 ENCOUNTER — Encounter (HOSPITAL_COMMUNITY): Payer: Self-pay | Admitting: Emergency Medicine

## 2019-10-24 ENCOUNTER — Other Ambulatory Visit: Payer: Self-pay

## 2019-10-24 ENCOUNTER — Emergency Department (HOSPITAL_COMMUNITY): Payer: Medicare Other

## 2019-10-24 ENCOUNTER — Inpatient Hospital Stay (HOSPITAL_COMMUNITY)
Admission: EM | Admit: 2019-10-24 | Discharge: 2019-10-30 | DRG: 480 | Disposition: A | Discharge status: Skilled Nursing Facility | Payer: Medicare Other | Attending: Internal Medicine | Admitting: Internal Medicine

## 2019-10-24 DIAGNOSIS — Z03818 Encounter for observation for suspected exposure to other biological agents ruled out: Secondary | ICD-10-CM | POA: Diagnosis not present

## 2019-10-24 DIAGNOSIS — R457 State of emotional shock and stress, unspecified: Secondary | ICD-10-CM | POA: Diagnosis not present

## 2019-10-24 DIAGNOSIS — D62 Acute posthemorrhagic anemia: Secondary | ICD-10-CM | POA: Diagnosis not present

## 2019-10-24 DIAGNOSIS — F05 Delirium due to known physiological condition: Secondary | ICD-10-CM | POA: Diagnosis not present

## 2019-10-24 DIAGNOSIS — S72142S Displaced intertrochanteric fracture of left femur, sequela: Secondary | ICD-10-CM | POA: Diagnosis not present

## 2019-10-24 DIAGNOSIS — E1165 Type 2 diabetes mellitus with hyperglycemia: Secondary | ICD-10-CM | POA: Diagnosis not present

## 2019-10-24 DIAGNOSIS — M25552 Pain in left hip: Secondary | ICD-10-CM | POA: Diagnosis not present

## 2019-10-24 DIAGNOSIS — R54 Age-related physical debility: Secondary | ICD-10-CM | POA: Diagnosis not present

## 2019-10-24 DIAGNOSIS — R262 Difficulty in walking, not elsewhere classified: Secondary | ICD-10-CM | POA: Diagnosis not present

## 2019-10-24 DIAGNOSIS — Z791 Long term (current) use of non-steroidal anti-inflammatories (NSAID): Secondary | ICD-10-CM

## 2019-10-24 DIAGNOSIS — G9341 Metabolic encephalopathy: Secondary | ICD-10-CM | POA: Diagnosis not present

## 2019-10-24 DIAGNOSIS — R402411 Glasgow coma scale score 13-15, in the field [EMT or ambulance]: Secondary | ICD-10-CM | POA: Diagnosis not present

## 2019-10-24 DIAGNOSIS — Z85820 Personal history of malignant melanoma of skin: Secondary | ICD-10-CM

## 2019-10-24 DIAGNOSIS — N895 Stricture and atresia of vagina: Secondary | ICD-10-CM | POA: Diagnosis not present

## 2019-10-24 DIAGNOSIS — M2550 Pain in unspecified joint: Secondary | ICD-10-CM | POA: Diagnosis not present

## 2019-10-24 DIAGNOSIS — M1710 Unilateral primary osteoarthritis, unspecified knee: Secondary | ICD-10-CM | POA: Diagnosis not present

## 2019-10-24 DIAGNOSIS — R339 Retention of urine, unspecified: Secondary | ICD-10-CM | POA: Diagnosis present

## 2019-10-24 DIAGNOSIS — S72142A Displaced intertrochanteric fracture of left femur, initial encounter for closed fracture: Secondary | ICD-10-CM | POA: Diagnosis not present

## 2019-10-24 DIAGNOSIS — Y92009 Unspecified place in unspecified non-institutional (private) residence as the place of occurrence of the external cause: Secondary | ICD-10-CM

## 2019-10-24 DIAGNOSIS — Z809 Family history of malignant neoplasm, unspecified: Secondary | ICD-10-CM

## 2019-10-24 DIAGNOSIS — E43 Unspecified severe protein-calorie malnutrition: Secondary | ICD-10-CM | POA: Diagnosis present

## 2019-10-24 DIAGNOSIS — Z6820 Body mass index (BMI) 20.0-20.9, adult: Secondary | ICD-10-CM | POA: Diagnosis not present

## 2019-10-24 DIAGNOSIS — Z79899 Other long term (current) drug therapy: Secondary | ICD-10-CM | POA: Diagnosis not present

## 2019-10-24 DIAGNOSIS — I959 Hypotension, unspecified: Secondary | ICD-10-CM | POA: Diagnosis not present

## 2019-10-24 DIAGNOSIS — S299XXA Unspecified injury of thorax, initial encounter: Secondary | ICD-10-CM | POA: Diagnosis not present

## 2019-10-24 DIAGNOSIS — Z20822 Contact with and (suspected) exposure to covid-19: Secondary | ICD-10-CM | POA: Diagnosis present

## 2019-10-24 DIAGNOSIS — Z8249 Family history of ischemic heart disease and other diseases of the circulatory system: Secondary | ICD-10-CM

## 2019-10-24 DIAGNOSIS — M810 Age-related osteoporosis without current pathological fracture: Secondary | ICD-10-CM | POA: Diagnosis not present

## 2019-10-24 DIAGNOSIS — Z79891 Long term (current) use of opiate analgesic: Secondary | ICD-10-CM | POA: Diagnosis not present

## 2019-10-24 DIAGNOSIS — R52 Pain, unspecified: Secondary | ICD-10-CM | POA: Diagnosis not present

## 2019-10-24 DIAGNOSIS — I10 Essential (primary) hypertension: Secondary | ICD-10-CM | POA: Diagnosis present

## 2019-10-24 DIAGNOSIS — Z4789 Encounter for other orthopedic aftercare: Secondary | ICD-10-CM | POA: Diagnosis not present

## 2019-10-24 DIAGNOSIS — W07XXXA Fall from chair, initial encounter: Secondary | ICD-10-CM | POA: Diagnosis present

## 2019-10-24 DIAGNOSIS — M1990 Unspecified osteoarthritis, unspecified site: Secondary | ICD-10-CM | POA: Diagnosis not present

## 2019-10-24 DIAGNOSIS — R0902 Hypoxemia: Secondary | ICD-10-CM | POA: Diagnosis not present

## 2019-10-24 DIAGNOSIS — M6281 Muscle weakness (generalized): Secondary | ICD-10-CM | POA: Diagnosis not present

## 2019-10-24 DIAGNOSIS — Z9181 History of falling: Secondary | ICD-10-CM | POA: Diagnosis not present

## 2019-10-24 DIAGNOSIS — R41841 Cognitive communication deficit: Secondary | ICD-10-CM | POA: Diagnosis not present

## 2019-10-24 DIAGNOSIS — S72002A Fracture of unspecified part of neck of left femur, initial encounter for closed fracture: Secondary | ICD-10-CM

## 2019-10-24 DIAGNOSIS — R2681 Unsteadiness on feet: Secondary | ICD-10-CM | POA: Diagnosis not present

## 2019-10-24 DIAGNOSIS — R338 Other retention of urine: Secondary | ICD-10-CM | POA: Diagnosis not present

## 2019-10-24 DIAGNOSIS — M545 Low back pain: Secondary | ICD-10-CM | POA: Diagnosis present

## 2019-10-24 DIAGNOSIS — Z466 Encounter for fitting and adjustment of urinary device: Secondary | ICD-10-CM | POA: Diagnosis not present

## 2019-10-24 DIAGNOSIS — Z7401 Bed confinement status: Secondary | ICD-10-CM | POA: Diagnosis not present

## 2019-10-24 LAB — CBC WITH DIFFERENTIAL/PLATELET
Abs Immature Granulocytes: 0.01 10*3/uL (ref 0.00–0.07)
Basophils Absolute: 0 10*3/uL (ref 0.0–0.1)
Basophils Relative: 0 %
Eosinophils Absolute: 0 10*3/uL (ref 0.0–0.5)
Eosinophils Relative: 0 %
HCT: 37.6 % (ref 36.0–46.0)
Hemoglobin: 12.1 g/dL (ref 12.0–15.0)
Immature Granulocytes: 0 %
Lymphocytes Relative: 18 %
Lymphs Abs: 1 10*3/uL (ref 0.7–4.0)
MCH: 30.8 pg (ref 26.0–34.0)
MCHC: 32.2 g/dL (ref 30.0–36.0)
MCV: 95.7 fL (ref 80.0–100.0)
Monocytes Absolute: 0.3 10*3/uL (ref 0.1–1.0)
Monocytes Relative: 5 %
Neutro Abs: 4.5 10*3/uL (ref 1.7–7.7)
Neutrophils Relative %: 77 %
Platelets: 217 10*3/uL (ref 150–400)
RBC: 3.93 MIL/uL (ref 3.87–5.11)
RDW: 13.6 % (ref 11.5–15.5)
WBC: 5.8 10*3/uL (ref 4.0–10.5)
nRBC: 0 % (ref 0.0–0.2)

## 2019-10-24 LAB — BASIC METABOLIC PANEL
Anion gap: 9 (ref 5–15)
BUN: 18 mg/dL (ref 8–23)
CO2: 29 mmol/L (ref 22–32)
Calcium: 8.9 mg/dL (ref 8.9–10.3)
Chloride: 100 mmol/L (ref 98–111)
Creatinine, Ser: 0.84 mg/dL (ref 0.44–1.00)
GFR calc Af Amer: >60 mL/min (ref >60)
GFR calc non Af Amer: >60 mL/min (ref >60)
Glucose, Bld: 176 mg/dL — ABNORMAL HIGH (ref 70–99)
Potassium: 4.1 mmol/L (ref 3.5–5.1)
Sodium: 138 mmol/L (ref 135–145)

## 2019-10-24 LAB — RESPIRATORY PANEL BY RT PCR (FLU A&B, COVID)
Influenza A by PCR: NEGATIVE
Influenza B by PCR: NEGATIVE
SARS Coronavirus 2 by RT PCR: NEGATIVE

## 2019-10-24 LAB — TYPE AND SCREEN
ABO/RH(D): A NEG
Antibody Screen: NEGATIVE

## 2019-10-24 LAB — PROTIME-INR
INR: 1 (ref 0.8–1.2)
Prothrombin Time: 13.2 seconds (ref 11.4–15.2)

## 2019-10-24 MED ORDER — HEPARIN SODIUM (PORCINE) 5000 UNIT/ML IJ SOLN
5000.0000 [IU] | Freq: Once | INTRAMUSCULAR | Status: AC
  Administered 2019-10-24: 5000 [IU] via SUBCUTANEOUS
  Filled 2019-10-24: qty 1

## 2019-10-24 MED ORDER — ONDANSETRON HCL 4 MG/2ML IJ SOLN
INTRAMUSCULAR | Status: AC
  Filled 2019-10-24: qty 2

## 2019-10-24 MED ORDER — MORPHINE SULFATE (PF) 2 MG/ML IV SOLN
0.5000 mg | INTRAVENOUS | Status: DC | PRN
  Administered 2019-10-24 – 2019-10-26 (×2): 0.5 mg via INTRAVENOUS
  Filled 2019-10-24 (×2): qty 1

## 2019-10-24 MED ORDER — HYDROCODONE-ACETAMINOPHEN 5-325 MG PO TABS
1.0000 | ORAL_TABLET | Freq: Four times a day (QID) | ORAL | Status: DC | PRN
  Filled 2019-10-24: qty 1

## 2019-10-24 MED ORDER — ONDANSETRON HCL 4 MG/2ML IJ SOLN
4.0000 mg | Freq: Four times a day (QID) | INTRAMUSCULAR | Status: DC | PRN
  Administered 2019-10-24: 4 mg via INTRAVENOUS

## 2019-10-24 MED ORDER — HYDROMORPHONE HCL 1 MG/ML IJ SOLN
1.0000 mg | Freq: Once | INTRAMUSCULAR | Status: AC
  Administered 2019-10-24: 1 mg via INTRAVENOUS
  Filled 2019-10-24: qty 1

## 2019-10-24 MED ORDER — LISINOPRIL 20 MG PO TABS
20.0000 mg | ORAL_TABLET | Freq: Every day | ORAL | Status: DC
  Administered 2019-10-25 – 2019-10-27 (×2): 20 mg via ORAL
  Filled 2019-10-24 (×5): qty 1

## 2019-10-24 NOTE — ED Provider Notes (Signed)
[facility] DEPT Provider Note   CSN: [id number] Arrival date & time: 10/24/19  1724     History Chief Complaint  Patient presents with  . Leg Pain  . Fall    Holly Salazar is a 84 y.o. female.  Patient is an 84 year old female with history of hypertension and osteoporosis with prior fracture of her right femur.  She presents today for evaluation of a fall.  Patient slid out of her chair and ended up on the floor, injuring her left thigh.  She was unable to stand up and walk and required transport here by EMS.  She is complaining of severe pain in this area.  She denies any numbness or tingling.  She denies any other injury during the fall.  The history is provided by the patient.  Leg Pain Lower extremity pain location: left thigh. Time since incident:  1 hour Pain details:    Quality:  Sharp   Radiates to:  Does not radiate   Severity:  Severe   Onset quality:  Sudden   Timing:  Constant   Progression:  Unchanged Chronicity:  New Relieved by:  Nothing Exacerbated by: movement and palpation. Ineffective treatments:  None tried      Past Medical History:  Diagnosis Date  . Arthritis    knee and back  . Cancer ([place])    melanoma on back  . Hypertension   . Osteoporosis     Patient Active Problem List   Diagnosis Date Noted  . Radius and ulna distal fracture 01/16/2016    Past Surgical History:  Procedure Laterality Date  . APPENDECTOMY    . CARPAL TUNNEL RELEASE Right   . COLONOSCOPY    . HIP FRACTURE SURGERY Right   . OPEN REDUCTION INTERNAL FIXATION (ORIF) DISTAL RADIAL FRACTURE Left 01/16/2016   Procedure: OPEN REDUCTION INTERNAL FIXATION (ORIF) LEFT  DISTAL RADIUS FRACTURE WITH REPAIR RECONSTRUCTION AS NEEDED ;  Surgeon: Roseanne Kaufman, MD;  Location: [facility];  Service: Orthopedics;  Laterality: Left;  . TONSILLECTOMY       OB History   No obstetric history on file.     Family History  Problem Relation Age of Onset  .  Heart disease Mother   . Cancer Father     Social History   Tobacco Use  . Smoking status: Never Smoker  . Smokeless tobacco: Never Used  Substance Use Topics  . Alcohol use: No  . Drug use: No    Home Medications Prior to Admission medications   Medication Sig Start Date End Date Taking? Authorizing Provider  alendronate (FOSAMAX) 70 MG tablet Take 70 mg by mouth every Saturday. Take with a full glass of water on an empty stomach.    [provider]  HYDROcodone-acetaminophen (NORCO) 5-325 MG tablet Take 2 tablets by mouth every 6 (six) hours as needed for moderate pain. 01/18/16   Roseanne Kaufman, MD  HYDROcodone-acetaminophen (NORCO/VICODIN) 5-325 MG tablet Take 1-2 tablets by mouth every 6 (six) hours as needed for severe pain. 01/07/16   Little, Wenda Overland, MD  lisinopril (PRINIVIL,ZESTRIL) 20 MG tablet Take 20 mg by mouth daily.    [provider]  Multiple Vitamin (MULTIVITAMIN WITH MINERALS) TABS tablet Take 1 tablet by mouth daily.    [provider]  naproxen sodium (ANAPROX) 220 MG tablet Take 220 mg by mouth daily as needed (pain).    [provider]    Allergies    Patient has no known allergies.  Review of Systems   Review of Systems  All other systems reviewed and are negative.   Physical Exam Updated Vital Signs BP (!) 145/68 (BP Location: Right Arm)   Pulse 89   Temp 98.5 F (36.9 C) (Oral)   Resp (!) 24   SpO2 99%   Physical Exam Vitals and nursing note reviewed.  Constitutional:      General: She is not in acute distress.    Appearance: She is well-developed. She is not diaphoretic.  HENT:     Head: Normocephalic and atraumatic.  Cardiovascular:     Rate and Rhythm: Normal rate and regular rhythm.     Heart sounds: No murmur. No friction rub. No gallop.   Pulmonary:     Effort: Pulmonary effort is normal. No respiratory distress.     Breath sounds: Normal breath sounds. No wheezing.  Abdominal:      General: Bowel sounds are normal. There is no distension.     Palpations: Abdomen is soft.     Tenderness: There is no abdominal tenderness.  Musculoskeletal:        General: Normal range of motion.     Cervical back: Normal range of motion and neck supple.     Comments: There is ttp in the proximal left femur along with shortening and external rotation of the left leg.  DP and PT pulses are palpable and motor and sensation are intact to the entire foot.  Skin:    General: Skin is warm and dry.  Neurological:     Mental Status: She is alert and oriented to person, place, and time.     ED Results / Procedures / Treatments   Labs (all labs ordered are listed, but only abnormal results are displayed) Labs Reviewed  BASIC METABOLIC PANEL  CBC WITH DIFFERENTIAL/PLATELET  PROTIME-INR    EKG EKG Interpretation  Date/Time:  Tuesday October 24 2019 17:52:11 EST Ventricular Rate:  85 PR Interval:    QRS Duration: 92 QT Interval:  361 QTC Calculation: 430 R Axis:   19 Text Interpretation: Sinus rhythm Possible Left Atrial Enlargement No significant change since 01/16/2016 Confirmed by Veryl Speak [phone]) on 10/24/2019 7:15:26 PM   Radiology No results found.  Procedures Procedures (including critical care time)  Medications Ordered in ED Medications  HYDROmorphone (DILAUDID) injection 1 mg (has no administration in time range)    ED Course  I have reviewed the triage vital signs and the nursing notes.  Pertinent labs & imaging results that were available during my care of the patient were reviewed by me and considered in my medical decision making (see chart for details).    MDM Rules/Calculators/A&P  Patient brought here by EMS after a fall at home.  She apparently slid off the couch and onto the floor injuring her left hip.  X-rays confirm and an impacted femoral neck fracture.  This finding was discussed with Dr. Lorin Mercy from orthopedic surgery.  He is recommending surgical  repair, likely tomorrow afternoon.  Patient will be admitted to the hospitalist service under the care of Dr. Alcario Drought.  Final Clinical Impression(s) / ED Diagnoses Final diagnoses:  None    Rx / DC Orders ED Discharge Orders    None       Veryl Speak, MD 10/24/19 [phone]

## 2019-10-24 NOTE — Progress Notes (Signed)
Pt was tearful during our visit. She wanted prayer and had several requests for prayer for her niece and nephews. She wants her niece to be her for her when she goes and comes out of surgery. (I spoke w/her nurse regarding same and she said she will take care of that.)  Pt would also like to see a Idelle Crouch (she is Catholic but does not have a Dance movement psychotherapist) before she goes to surgery. I told her I would make the request for our day Chaplains to make the arrangements. Pt was very grateful for the visit and prayer. Please page if additional support is needed. [place], [facility]   10/24/19 2100  Clinical Encounter Type  Visited With Patient

## 2019-10-24 NOTE — ED Notes (Signed)
Report called to nurse upstairs. Pt stable for transport

## 2019-10-24 NOTE — H&P (Signed)
History and Physical    Holly Salazar [id number] DOB: 17-Aug-1936 DOA: 10/24/2019  PCP: Kathyrn Lass, MD  Patient coming from: Home  I have personally briefly reviewed patient's old medical records in [place]  Chief Complaint: Fall, hip pain  HPI: Holly Salazar is a 84 y.o. female with medical history significant of HTN, osteoporosis.  Pt normally walks with cane at baseline.  Doesn't walk up stairs anymore.  No CP nor SOB with ambulation.  Pt presents to ED after mechanical fall, slid out of chair at home.  Landed on L hip.  Severe L hip pain and inability to bear weight post fall.  EMS transported to ED.   ED Course: Impacted L femoral neck fx.   Review of Systems: As per HPI, otherwise all review of systems negative.  Past Medical History:  Diagnosis Date  . Arthritis    knee and back  . Cancer ([place])    melanoma on back  . Hypertension   . Osteoporosis     Past Surgical History:  Procedure Laterality Date  . APPENDECTOMY    . CARPAL TUNNEL RELEASE Right   . COLONOSCOPY    . HIP FRACTURE SURGERY Right   . OPEN REDUCTION INTERNAL FIXATION (ORIF) DISTAL RADIAL FRACTURE Left 01/16/2016   Procedure: OPEN REDUCTION INTERNAL FIXATION (ORIF) LEFT  DISTAL RADIUS FRACTURE WITH REPAIR RECONSTRUCTION AS NEEDED ;  Surgeon: Roseanne Kaufman, MD;  Location: [facility];  Service: Orthopedics;  Laterality: Left;  . TONSILLECTOMY       reports that she has never smoked. She has never used smokeless tobacco. She reports that she does not drink alcohol or use drugs.  No Known Allergies  Family History  Problem Relation Age of Onset  . Heart disease Mother   . Cancer Father      Prior to Admission medications   Medication Sig Start Date End Date Taking? Authorizing Provider  lisinopril (PRINIVIL,ZESTRIL) 20 MG tablet Take 20 mg by mouth daily.   Yes [provider]  Multiple Vitamin (MULTIVITAMIN WITH MINERALS) TABS tablet Take 1 tablet by mouth daily.   Yes  [provider]  naproxen sodium (ANAPROX) 220 MG tablet Take 220 mg by mouth daily as needed (pain).   Yes [provider]  HYDROcodone-acetaminophen (NORCO) 5-325 MG tablet Take 2 tablets by mouth every 6 (six) hours as needed for moderate pain. Patient not taking: Reported on 10/24/2019 01/18/16   Roseanne Kaufman, MD  HYDROcodone-acetaminophen (NORCO/VICODIN) 5-325 MG tablet Take 1-2 tablets by mouth every 6 (six) hours as needed for severe pain. Patient not taking: Reported on 10/24/2019 01/07/16   Little, Wenda Overland, MD    Physical Exam: Vitals:   10/24/19 1736 10/24/19 1751 10/24/19 1753  BP:   (!) 145/68  Pulse:   89  Resp:   (!) 24  Temp:   98.5 F (36.9 C)  TempSrc:   Oral  SpO2: 97% 100% 99%    Constitutional: NAD, calm, comfortable Eyes: PERRL, lids and conjunctivae normal ENMT: Mucous membranes are moist. Posterior pharynx clear of any exudate or lesions.Normal dentition.  Neck: normal, supple, no masses, no thyromegaly Respiratory: clear to auscultation bilaterally, no wheezing, no crackles. Normal respiratory effort. No accessory muscle use.  Cardiovascular: Regular rate and rhythm, no murmurs / rubs / gallops. No extremity edema. 2+ pedal pulses. No carotid bruits.  Abdomen: no tenderness, no masses palpated. No hepatosplenomegaly. Bowel sounds positive.  Musculoskeletal: L hip TTP. Skin: no rashes, lesions, ulcers. No induration Neurologic:  CN 2-12 grossly intact. Sensation intact, DTR normal. Strength 5/5 in all 4.  Psychiatric: Normal judgment and insight. Alert and oriented x 3. Normal mood.    Labs on Admission: I have personally reviewed following labs and imaging studies  CBC: Recent Labs  Lab 10/24/19 1800  WBC 5.8  NEUTROABS 4.5  HGB 12.1  HCT 37.6  MCV 95.7  PLT [id number]   Basic Metabolic Panel: Recent Labs  Lab 10/24/19 1800  NA 138  K 4.1  CL 100  CO2 29  GLUCOSE 176*  BUN 18  CREATININE 0.84  CALCIUM 8.9   GFR: CrCl  cannot be calculated (Unknown ideal weight.). Liver Function Tests: No results for input(s): AST, ALT, ALKPHOS, BILITOT, PROT, ALBUMIN in the last 168 hours. No results for input(s): LIPASE, AMYLASE in the last 168 hours. No results for input(s): AMMONIA in the last 168 hours. Coagulation Profile: Recent Labs  Lab 10/24/19 1800  INR 1.0   Cardiac Enzymes: No results for input(s): CKTOTAL, CKMB, CKMBINDEX, TROPONINI in the last 168 hours. BNP (last 3 results) No results for input(s): PROBNP in the last 8760 hours. HbA1C: No results for input(s): HGBA1C in the last 72 hours. CBG: No results for input(s): GLUCAP in the last 168 hours. Lipid Profile: No results for input(s): CHOL, HDL, LDLCALC, TRIG, CHOLHDL, LDLDIRECT in the last 72 hours. Thyroid Function Tests: No results for input(s): TSH, T4TOTAL, FREET4, T3FREE, THYROIDAB in the last 72 hours. Anemia Panel: No results for input(s): VITAMINB12, FOLATE, FERRITIN, TIBC, IRON, RETICCTPCT in the last 72 hours. Urine analysis: No results found for: COLORURINE, APPEARANCEUR, LABSPEC, [place], GLUCOSEU, HGBUR, BILIRUBINUR, KETONESUR, PROTEINUR, UROBILINOGEN, NITRITE, LEUKOCYTESUR  Radiological Exams on Admission: DG Chest 1 View  Result Date: 10/24/2019 CLINICAL DATA:  Hip fracture.  Fall EXAM: CHEST  1 VIEW COMPARISON:  None. FINDINGS: Pulmonary hyperinflation. Lungs clear without infiltrate or effusion. Negative for heart failure. IMPRESSION: COPD without acute abnormality. Electronically Signed   By: Franchot Gallo M.D.   On: 10/24/2019 19:09   DG Hip Unilat W or Wo Pelvis 2-3 Views Left  Result Date: 10/24/2019 CLINICAL DATA:  Fall EXAM: DG HIP (WITH OR WITHOUT PELVIS) 2-3V LEFT COMPARISON:  None. FINDINGS: Impacted fracture left femoral neck. Left hip joint otherwise normal Surgical repair of right hip fracture with screw and rod. No other pelvic lesion IMPRESSION: Impacted fracture left femoral neck. Electronically Signed   By:  Franchot Gallo M.D.   On: 10/24/2019 19:08   DG Femur Min 2 Views Left  Result Date: 10/24/2019 CLINICAL DATA:  Fall EXAM: LEFT FEMUR 2 VIEWS COMPARISON:  None. FINDINGS: Fracture left femoral neck with impaction. No other fracture of the femur Advanced degenerative change in the knee joint with joint space narrowing and chondrocalcinosis IMPRESSION: Left femoral neck impacted fracture. Electronically Signed   By: Franchot Gallo M.D.   On: 10/24/2019 19:07    EKG: Independently reviewed.  Assessment/Plan Principal Problem:   Closed displaced fracture of left femoral neck (HCC) Active Problems:   HTN (hypertension)    1. Closed displaced fx of L femoral neck - 1. Hip fx pathway 2. Pain ctrl per pathway 3. zofran PRN nausea 4. EDP spoke with Dr. Lorin Mercy: 1. Surgery tomorrow afternoon 2. Therefore will make NPO after 0500 3. Give single dose heparin 5000 units [place] now (>12h pre-op). 5. EKG and CXR look okay, nothing worrisome on history.  Seems reasonable from cardiopulmonary standpoint to proceed with surgery. 2. HTN - 1. Cont lisinopril  DVT  prophylaxis: SCDs, and single dose heparin [place] now Code Status: Full Family Communication: No family in room Disposition Plan: SNF / rehab Consults called: Dr. Lorin Mercy Admission status: Admit to inpatient  Severity of Illness: The appropriate patient status for this patient is INPATIENT. Inpatient status is judged to be reasonable and necessary in order to provide the required intensity of service to ensure the patient's safety. The patient's presenting symptoms, physical exam findings, and initial radiographic and laboratory data in the context of their chronic comorbidities is felt to place them at high risk for further clinical deterioration. Furthermore, it is not anticipated that the patient will be medically stable for discharge from the hospital within 2 midnights of admission. The following factors support the patient status of inpatient.    IP status for OR repair of hip fx.  * I certify that at the point of admission it is my clinical judgment that the patient will require inpatient hospital care spanning beyond 2 midnights from the point of admission due to high intensity of service, high risk for further deterioration and high frequency of surveillance required.*    Anne-Marie Genson M. DO [facility]  How to contact the [facility] Attending or Consulting provider [place] or covering provider during after hours [place], for this patient?  1. Check the care team in [facility] and look for a) attending/consulting [facility] provider listed and b) the [facility] team listed 2. Log into [email or web address]  Amion Physician Scheduling and messaging for groups and whole hospitals  On call and physician scheduling software for group practices, residents, hospitalists and other medical providers for call, clinic, rotation and shift schedules. OnCall Enterprise is a hospital-wide system for scheduling doctors and paging doctors on call. EasyPlot is for scientific plotting and data analysis.  [email or web address]  and use [place]'s universal password to access. If you do not have the password, please contact the hospital operator.  3. Locate the [facility] provider you are looking for under [facility] and page to a number that you can be directly reached. 4. If you still have difficulty reaching the provider, please page the [facility] (Director on Call) for the Hospitalists listed on amion for assistance.  10/24/2019, 8:23 PM

## 2019-10-24 NOTE — ED Triage Notes (Signed)
Pt BIBA from home.   Per EMS- Pt reports unwitnessed fall appx 1615, "slid out of the chair to the floor." Pt denies taking blood thinners, denies LOC.  Pt c/o left thigh pain, left hip pain.  No shortening or rotation noted by EMS.  AOx4.

## 2019-10-25 ENCOUNTER — Inpatient Hospital Stay (HOSPITAL_COMMUNITY): Payer: Medicare Other | Admitting: Certified Registered"

## 2019-10-25 ENCOUNTER — Inpatient Hospital Stay (HOSPITAL_COMMUNITY): Payer: Medicare Other

## 2019-10-25 ENCOUNTER — Encounter (HOSPITAL_COMMUNITY): Payer: Self-pay | Admitting: Internal Medicine

## 2019-10-25 ENCOUNTER — Encounter (HOSPITAL_COMMUNITY)
Admission: EM | Disposition: A | Discharge status: Skilled Nursing Facility | Payer: Self-pay | Source: Home / Self Care | Attending: Internal Medicine

## 2019-10-25 DIAGNOSIS — R338 Other retention of urine: Secondary | ICD-10-CM

## 2019-10-25 DIAGNOSIS — S72142A Displaced intertrochanteric fracture of left femur, initial encounter for closed fracture: Principal | ICD-10-CM

## 2019-10-25 HISTORY — PX: FEMUR IM NAIL: SHX1597

## 2019-10-25 LAB — SURGICAL PCR SCREEN
MRSA, PCR: NEGATIVE
Staphylococcus aureus: NEGATIVE

## 2019-10-25 LAB — ABO/RH: ABO/RH(D): A NEG

## 2019-10-25 SURGERY — INSERTION, INTRAMEDULLARY ROD, FEMUR
Anesthesia: Spinal | Site: Hip | Laterality: Left

## 2019-10-25 MED ORDER — PROPOFOL 500 MG/50ML IV EMUL
INTRAVENOUS | Status: DC | PRN
  Administered 2019-10-25: 30 ug/kg/min via INTRAVENOUS

## 2019-10-25 MED ORDER — FENTANYL CITRATE (PF) 100 MCG/2ML IJ SOLN
INTRAMUSCULAR | Status: AC
  Filled 2019-10-25: qty 2

## 2019-10-25 MED ORDER — ASPIRIN EC 325 MG PO TBEC
325.0000 mg | DELAYED_RELEASE_TABLET | Freq: Every day | ORAL | Status: DC
  Administered 2019-10-27 – 2019-10-30 (×4): 325 mg via ORAL
  Filled 2019-10-25 (×5): qty 1

## 2019-10-25 MED ORDER — BUPIVACAINE HCL (PF) 0.5 % IJ SOLN
INTRAMUSCULAR | Status: AC
  Filled 2019-10-25: qty 30

## 2019-10-25 MED ORDER — MENTHOL 3 MG MT LOZG: 1.0000 | LOZENGE | OROMUCOSAL | Status: DC | PRN

## 2019-10-25 MED ORDER — PHENYLEPHRINE HCL-NACL 10-0.9 MG/250ML-% IV SOLN
INTRAVENOUS | Status: DC | PRN
  Administered 2019-10-25: 40 ug/min via INTRAVENOUS

## 2019-10-25 MED ORDER — METOCLOPRAMIDE HCL 5 MG PO TABS: 5.0000 mg | ORAL_TABLET | Freq: Three times a day (TID) | ORAL | Status: DC | PRN

## 2019-10-25 MED ORDER — CHLORHEXIDINE GLUCONATE 4 % EX LIQD
60.0000 mL | Freq: Once | CUTANEOUS | Status: AC
  Administered 2019-10-25: 4 via TOPICAL

## 2019-10-25 MED ORDER — PROPOFOL 10 MG/ML IV BOLUS
INTRAVENOUS | Status: AC
  Filled 2019-10-25: qty 20

## 2019-10-25 MED ORDER — BUPIVACAINE HCL (PF) 0.5 % IJ SOLN
INTRAMUSCULAR | Status: DC | PRN
  Administered 2019-10-25: 18 mL

## 2019-10-25 MED ORDER — PHENYLEPHRINE 40 MCG/ML (10ML) SYRINGE FOR IV PUSH (FOR BLOOD PRESSURE SUPPORT)
PREFILLED_SYRINGE | INTRAVENOUS | Status: DC | PRN
  Administered 2019-10-25 (×3): 80 ug via INTRAVENOUS

## 2019-10-25 MED ORDER — ONDANSETRON HCL 4 MG/2ML IJ SOLN: 4.0000 mg | Freq: Once | INTRAMUSCULAR | Status: DC | PRN

## 2019-10-25 MED ORDER — LACTATED RINGERS IV SOLN: INTRAVENOUS | Status: DC

## 2019-10-25 MED ORDER — PROPOFOL 10 MG/ML IV BOLUS
INTRAVENOUS | Status: DC | PRN
  Administered 2019-10-25 (×2): 10 mg via INTRAVENOUS

## 2019-10-25 MED ORDER — ONDANSETRON HCL 4 MG/2ML IJ SOLN: 4.0000 mg | Freq: Four times a day (QID) | INTRAMUSCULAR | Status: DC | PRN

## 2019-10-25 MED ORDER — ACETAMINOPHEN 10 MG/ML IV SOLN: 1000.0000 mg | Freq: Once | INTRAVENOUS | Status: DC | PRN

## 2019-10-25 MED ORDER — CEFAZOLIN SODIUM-DEXTROSE 2-4 GM/100ML-% IV SOLN
2.0000 g | INTRAVENOUS | Status: AC
  Administered 2019-10-25: 2 g via INTRAVENOUS
  Filled 2019-10-25: qty 100

## 2019-10-25 MED ORDER — POVIDONE-IODINE 10 % EX SWAB
2.0000 "application " | Freq: Once | CUTANEOUS | Status: AC
  Administered 2019-10-25: 2 via TOPICAL

## 2019-10-25 MED ORDER — DEXAMETHASONE SODIUM PHOSPHATE 10 MG/ML IJ SOLN
INTRAMUSCULAR | Status: AC
  Filled 2019-10-25: qty 1

## 2019-10-25 MED ORDER — PROPOFOL 500 MG/50ML IV EMUL
INTRAVENOUS | Status: AC
  Filled 2019-10-25: qty 50

## 2019-10-25 MED ORDER — FENTANYL CITRATE (PF) 100 MCG/2ML IJ SOLN
INTRAMUSCULAR | Status: DC | PRN
  Administered 2019-10-25 (×2): 25 ug via INTRAVENOUS
  Administered 2019-10-25: 50 ug via INTRAVENOUS

## 2019-10-25 MED ORDER — 0.9 % SODIUM CHLORIDE (POUR BTL) OPTIME
TOPICAL | Status: DC | PRN
  Administered 2019-10-25: 20:00:00 1000 mL

## 2019-10-25 MED ORDER — BUPIVACAINE HCL (PF) 0.5 % IJ SOLN
INTRAMUSCULAR | Status: DC | PRN
  Administered 2019-10-25: 12 mg via INTRATHECAL

## 2019-10-25 MED ORDER — DEXAMETHASONE SODIUM PHOSPHATE 10 MG/ML IJ SOLN
INTRAMUSCULAR | Status: DC | PRN
  Administered 2019-10-25: 4 mg via INTRAVENOUS

## 2019-10-25 MED ORDER — DOCUSATE SODIUM 100 MG PO CAPS
100.0000 mg | ORAL_CAPSULE | Freq: Two times a day (BID) | ORAL | Status: DC
  Administered 2019-10-26 – 2019-10-30 (×8): 100 mg via ORAL
  Filled 2019-10-25 (×8): qty 1

## 2019-10-25 MED ORDER — PHENYLEPHRINE HCL (PRESSORS) 10 MG/ML IV SOLN
INTRAVENOUS | Status: AC
  Filled 2019-10-25: qty 1

## 2019-10-25 MED ORDER — ALBUMIN HUMAN 5 % IV SOLN: INTRAVENOUS | Status: DC | PRN

## 2019-10-25 MED ORDER — ONDANSETRON HCL 4 MG PO TABS: 4.0000 mg | ORAL_TABLET | Freq: Four times a day (QID) | ORAL | Status: DC | PRN

## 2019-10-25 MED ORDER — METOCLOPRAMIDE HCL 5 MG/ML IJ SOLN: 5.0000 mg | Freq: Three times a day (TID) | INTRAMUSCULAR | Status: DC | PRN

## 2019-10-25 MED ORDER — LIDOCAINE 2% (20 MG/ML) 5 ML SYRINGE
INTRAMUSCULAR | Status: AC
  Filled 2019-10-25: qty 5

## 2019-10-25 MED ORDER — ONDANSETRON HCL 4 MG/2ML IJ SOLN
INTRAMUSCULAR | Status: AC
  Filled 2019-10-25: qty 2

## 2019-10-25 MED ORDER — FENTANYL CITRATE (PF) 100 MCG/2ML IJ SOLN: 25.0000 ug | INTRAMUSCULAR | Status: DC | PRN

## 2019-10-25 MED ORDER — ONDANSETRON HCL 4 MG/2ML IJ SOLN
INTRAMUSCULAR | Status: DC | PRN
  Administered 2019-10-25: 4 mg via INTRAVENOUS

## 2019-10-25 MED ORDER — BUPIVACAINE HCL (PF) 0.5 % IJ SOLN: INTRAMUSCULAR | Status: DC | PRN

## 2019-10-25 MED ORDER — ENSURE PRE-SURGERY PO LIQD
296.0000 mL | Freq: Once | ORAL | Status: AC
  Administered 2019-10-25: 296 mL via ORAL
  Filled 2019-10-25: qty 296

## 2019-10-25 MED ORDER — PHENOL 1.4 % MT LIQD
1.0000 | OROMUCOSAL | Status: DC | PRN
  Filled 2019-10-25: qty 177

## 2019-10-25 SURGICAL SUPPLY — 34 items
BAG ZIPLOCK 12X15 (MISCELLANEOUS) ×2 IMPLANT
BIT DRILL CANN LG 4.3MM (BIT) IMPLANT
BNDG COHESIVE 4X5 TAN STRL (GAUZE/BANDAGES/DRESSINGS) ×2 IMPLANT
CHLORAPREP W/TINT 26 (MISCELLANEOUS) ×2 IMPLANT
COVER BACK TABLE 60X90IN (DRAPES) ×2 IMPLANT
COVER SURGICAL LIGHT HANDLE (MISCELLANEOUS) ×2 IMPLANT
COVER WAND RF STERILE (DRAPES) IMPLANT
DRAPE STERI IOBAN 125X83 (DRAPES) ×2 IMPLANT
DRILL BIT CANN LG 4.3MM (BIT) ×2
DRSG PAD ABDOMINAL 8X10 ST (GAUZE/BANDAGES/DRESSINGS) ×2 IMPLANT
ELECT REM PT RETURN 15FT ADLT (MISCELLANEOUS) ×2 IMPLANT
EVACUATOR 1/8 PVC DRAIN (DRAIN) ×2 IMPLANT
GAUZE SPONGE 4X4 12PLY STRL (GAUZE/BANDAGES/DRESSINGS) ×2 IMPLANT
GAUZE XEROFORM 5X9 LF (GAUZE/BANDAGES/DRESSINGS) ×2 IMPLANT
GLOVE ORTHO TXT STRL SZ7.5 (GLOVE) ×2 IMPLANT
GOWN STRL REUS W/TWL LRG LVL3 (GOWN DISPOSABLE) ×2 IMPLANT
GUIDEPIN 3.2X17.5 THRD DISP (PIN) ×1 IMPLANT
HIP FRAC NAIL LAG SCR 10.5X100 (Orthopedic Implant) ×1 IMPLANT
KIT BASIN OR (CUSTOM PROCEDURE TRAY) ×2 IMPLANT
KIT TURNOVER KIT A (KITS) IMPLANT
NAIL HIP FRACT 130D 11X180 (Screw) ×1 IMPLANT
PACK GENERAL/GYN (CUSTOM PROCEDURE TRAY) ×2 IMPLANT
PENCIL SMOKE EVACUATOR (MISCELLANEOUS) IMPLANT
PROTECTOR NERVE ULNAR (MISCELLANEOUS) ×2 IMPLANT
SCREW BONE CORTICAL 5.0X38 (Screw) ×1 IMPLANT
SCREW CANN THRD AFF 10.5X100 (Orthopedic Implant) IMPLANT
STAPLER VISISTAT 35W (STAPLE) ×2 IMPLANT
SUT ETHILON 2 0 PS N (SUTURE) ×4 IMPLANT
SUT VIC AB 1 CT1 27 (SUTURE) ×2
SUT VIC AB 1 CT1 27XBRD ANTBC (SUTURE) ×2 IMPLANT
SUT VIC AB 2-0 CT1 27 (SUTURE) ×2
SUT VIC AB 2-0 CT1 TAPERPNT 27 (SUTURE) ×2 IMPLANT
TAPE CLOTH SURG 6X10 WHT LF (GAUZE/BANDAGES/DRESSINGS) ×1 IMPLANT
TOWEL OR 17X26 10 PK STRL BLUE (TOWEL DISPOSABLE) ×4 IMPLANT

## 2019-10-25 NOTE — Progress Notes (Signed)
Patient still denies of discomfort or pain- urology says to leave out foley after unsuccessful attempts will be reinserted in surgery. Will continue to monitor patient status and comfort.

## 2019-10-25 NOTE — Progress Notes (Signed)
Provided support at prayers at bedside prior to surgery at pt request.    Ms Endres spoke was loquacious and somewhat tangential in conversation.  Spoke with chaplain about her sister, Pamala Hurry, who died in 2023/03/08.  She spoke about her home in [place], stating that she is hopeful to return there someday.    Shared prayers with chaplain.  She is hoping to be able to take her rosary with her into surgery as a source of comfort.      Jerene Pitch, MDiv, [facility]  Lead Clinical Chaplain  [facility]

## 2019-10-25 NOTE — Progress Notes (Signed)
Patient foley insertion attempt four times by this nurse and other RN's- all attempts unsuccessful. Patient denies discomfort- patient bladder scanned- 530 cc. Notified Dr. Cruzita Lederer of attempts. Will continue to monitor.

## 2019-10-25 NOTE — Anesthesia Procedure Notes (Signed)
Spinal  Patient location during procedure: OR Start time: 10/25/2019 7:39 PM End time: 10/25/2019 7:49 PM Staffing Anesthesiologist: Barnet Glasgow, MD Preanesthetic Checklist Completed: patient identified, IV checked, site marked, risks and benefits discussed, surgical consent, monitors and equipment checked, pre-op evaluation and timeout performed Spinal Block Patient position: right lateral decubitus Prep: DuraPrep Patient monitoring: heart rate, cardiac monitor, continuous pulse ox and blood pressure Approach: midline Location: L3-4 Injection technique: single-shot Needle Needle type: Sprotte  Needle gauge: 24 G Needle length: 9 cm Needle insertion depth: 6 cm Assessment Sensory level: T4 Additional Notes 3 attempts  Pt tolerated procedure well.

## 2019-10-25 NOTE — Procedures (Signed)
Preoperative diagnosis: Difficult urethral catheterization  Postoperative diagnosis: Difficult urethral catheterization  Procedure: Complex urethral catheterization  Surgeon: Pryor Curia MD  Anesthesia: Spinal  Indication: Holly Salazar is an 84 year old female who was noted to be in urinary retention earlier today.  Attempts to place a catheter at the bedside were unsuccessful due to her stenotic vaginal opening, retracted urethra, and inability to mobilize her left lower extremity.  She was taken to the operating room this evening for repair of her hip fracture.  It was felt that this would also allow a better opportunity to place her urethral catheter.  Description of procedure: After spinal anesthesia was induced, I conferred with Dr. Lorin Mercy.  Her left lower extremity was able to be mobilized enough to allow adequate visualization of her small, stenotic vaginal introitus.  Her ureter was identified and a 66 French ureteral catheter was able to be placed under direct vision and under sterile conditions.  This returned grossly clear urine.  The procedure was then turned over to Dr. Lorin Mercy.

## 2019-10-25 NOTE — Anesthesia Preprocedure Evaluation (Addendum)
Anesthesia Evaluation  Patient identified by MRN, date of birth, ID band Patient awake    Reviewed: Allergy & Precautions, NPO status , Patient's Chart, lab work & pertinent test results  Airway Mallampati: II  TM Distance: >3 FB Neck ROM: Full    Dental no notable dental hx. (+) Poor Dentition, Dental Advisory Given,    Pulmonary neg pulmonary ROS,    Pulmonary exam normal breath sounds clear to auscultation       Cardiovascular hypertension, Normal cardiovascular exam Rhythm:Regular Rate:Normal     Neuro/Psych negative neurological ROS  negative psych ROS   GI/Hepatic negative GI ROS, Neg liver ROS,   Endo/Other  negative endocrine ROS  Renal/GU K+ 4.1 Cr 0.84     Musculoskeletal   Abdominal   Peds  Hematology Hgb 12.1 Plt 217   Anesthesia Other Findings   Reproductive/Obstetrics                            Anesthesia Physical Anesthesia Plan  ASA: III  Anesthesia Plan: Spinal   Post-op Pain Management:    Induction:   PONV Risk Score and Plan: 2 and Treatment may vary due to age or medical condition, Ondansetron and Dexamethasone  Airway Management Planned: Nasal Cannula and Natural Airway  Additional Equipment: None  Intra-op Plan:   Post-operative Plan:   Informed Consent:     Dental advisory given  Plan Discussed with:   Anesthesia Plan Comments:         Anesthesia Quick Evaluation

## 2019-10-25 NOTE — Progress Notes (Signed)
Phone call to [employer].  Updated on current condition.  Provided Nurse # to call for updates.  Not coming in tomorrow D/T prior appointment.

## 2019-10-25 NOTE — Anesthesia Postprocedure Evaluation (Signed)
Anesthesia Post Note  Patient: Holly Salazar  Procedure(s) Performed: INTRAMEDULLARY (IM) NAIL FEMORAL (Left Hip)     Patient location during evaluation: Nursing Unit Anesthesia Type: Spinal Level of consciousness: oriented and awake and alert Pain management: pain level controlled Vital Signs Assessment: post-procedure vital signs reviewed and stable Respiratory status: spontaneous breathing and respiratory function stable Cardiovascular status: blood pressure returned to baseline and stable Postop Assessment: no headache, no backache, no apparent nausea or vomiting and patient able to bend at knees Anesthetic complications: no    Last Vitals:  Vitals:   10/25/19 2107 10/25/19 2158  BP: 108/85 129/82  Pulse:  78  Resp: 19 16  Temp:  36.9 C  SpO2: 92% 98%    Last Pain:  Vitals:   10/25/19 2158  TempSrc: Oral  PainSc:                  Barnet Glasgow

## 2019-10-25 NOTE — Progress Notes (Signed)
Urology charge nurse from 4th floor unable to insert foley after 2 attempts- notified Dr. Cruzita Lederer of unsuccessful attempts. Awaiting orders for urology consult.

## 2019-10-25 NOTE — Op Note (Signed)
Preop diagnosis: Left intertrochanteric hip fracture  Postop diagnosis: Same  Procedure: Trochanteric nail with interlocks.  Biomet affixes 130 degree 11 mm nail.  100 mm lag screw 38 mm distal interlock.  Surgeon: Rodell Perna, MD  Anesthesia spinal +18 cc local Marcaine quarter percent plain.  EBL [id number] cc  Complications: None  Procedure after induction of spinal anesthesia Dr. Alinda Money placed Foley catheter which had been unsuccessful attempts x2 upon the floor.  Unna boot was applied to the left foot she was placed on the Hana table with a well leg holder.  Reduction was performed confirmed under C arm standard prepping and draping was performed with the large sharp curtain drape.  Incision was made after timeout proximal trochanter.  Pin was placed freehand at the tip the trochanter drilled overreamed and a 1 mm nail placed.  Pin was drilled up and head center center measured 105 mm.  100 mm nail was selected placed and then distal interlock 38 mm.  Final spot pictures were taken the knee was locked down in static position.  Irrigated deep layer closed with #1 Vicryl 2-0 Vicryl subtendinous tissue skin staple closure postop dressing.

## 2019-10-25 NOTE — Progress Notes (Signed)
Initial Nutrition Assessment  DOCUMENTATION CODES:   Severe malnutrition in context of social or environmental circumstances  INTERVENTION:  Provide nutrition supplements with diet advancement  -Will provide Ensure Enlive po BID, each supplement provides 350 kcal and 20 grams of protein (chocolate)  -Will provide MVI with minerals daily  -Recommend dysphagia 3 (mechanical soft) due to poor dentition   NUTRITION DIAGNOSIS:   Severe Malnutrition related to social / environmental circumstances as evidenced by energy intake < or equal to 50% for > or equal to 1 month, severe fat depletion, moderate fat depletion, moderate muscle depletion, severe muscle depletion.    GOAL:   Patient will meet greater than or equal to 90% of their needs   MONITOR:   Labs, I & O's, Diet advancement, Supplement acceptance, PO intake, Weight trends  REASON FOR ASSESSMENT:   Consult Hip fracture protocol  ASSESSMENT:    84 year old female with past medical history of HTN, osteoporosis, arthritis presented via EMS with left hip pain after mechanical fall at home. X-ray showed closed displaced fx of L femoral neck.  Per chart, patient evaluated by ortho, plans for operative repair today.  Patient awake and alert this morning at RD visit, NPO for pending surgery. Patient reports living alone and does not prepare meals for herself, stated that things have gotten too expensive at the grocery store and  recalls eating mostly peanut butter sandwiches. RD suggested the possibility of receiving Meals on Wheels, patient not interested in service, stated she is fine with eating peanut butter and that is all she needs.   Patient reports that she is hopeful of returning home by the end of the week, stated she needed to pay her tax lady. RD educated on the importance of adequate calorie/protien intake to promote post-op healing and optimal recovery time. Patient amenable to chocolate flavored supplements. Patient  with poor dentition, missing the top four front teeth, recommend dysphagia 3 (mechanical soft) with diet advancement s/p procedure.  Patient stated that she is alone here, shares that she is from [place] and became tearful when talking about her sister who passed away last year. Patient reports that she feels she is going to need to be placed in a nursing home, but [facility] is expensive and would like to return to [place].   Current wt 130.68 lbs UBW 155 lbs last March per pt, no weight history available for review.  NUTRITION - FOCUSED PHYSICAL EXAM:    Most Recent Value  Orbital Region  Severe depletion  Upper Arm Region  Severe depletion  Thoracic and Lumbar Region  Unable to assess  Buccal Region  Severe depletion  Temple Region  Moderate depletion  Clavicle Bone Region  Severe depletion  Clavicle and Acromion Bone Region  Severe depletion  Scapular Bone Region  Unable to assess  Dorsal Hand  Severe depletion  Patellar Region  Unable to assess  Anterior Thigh Region  Unable to assess  Posterior Calf Region  Unable to assess  Edema (RD Assessment)  None  Hair  Reviewed  Eyes  Reviewed  Mouth  Reviewed [missing the top four front teeth]  Skin  Reviewed  Nails  Reviewed       Diet Order:   Diet Order            Diet NPO time specified  Diet effective 1000              EDUCATION NEEDS:   Education needs have been addressed  Skin:  Skin Assessment: Reviewed RN Assessment  Last BM:  3/1  Height:   Ht Readings from Last 1 Encounters:  10/24/19 5\' 7"  (1.702 m)    Weight:   Wt Readings from Last 1 Encounters:  10/24/19 59.4 kg    Ideal Body Weight:     BMI:  Body mass index is 20.51 kg/m.  Estimated Nutritional Needs:   Kcal:  1600-1800  Protein:  80-90  Fluid:  >/= 1.5 L/day    Lajuan Lines, RD, LDN Clinical Nutrition After Hours/Weekend Pager # in [place]

## 2019-10-25 NOTE — Transfer of Care (Signed)
Immediate Anesthesia Transfer of Care Note  Patient: Nikiah Dominski  Procedure(s) Performed: INTRAMEDULLARY (IM) NAIL FEMORAL (Left Hip)  Patient Location: PACU  Anesthesia Type:Spinal  Level of Consciousness: pateint uncooperative  Airway & Oxygen Therapy: Patient Spontanous Breathing  Post-op Assessment: Report given to RN and Post -op Vital signs reviewed and stable  Post vital signs: Reviewed and stable  Last Vitals:  Vitals Value Taken Time  BP    Temp    Pulse 73 10/25/19 2059  Resp 20 10/25/19 2059  SpO2 79 % 10/25/19 2059  Vitals shown include unvalidated device data.  Last Pain:  Vitals:   10/25/19 1820  TempSrc:   PainSc: 0-No pain      Patients Stated Pain Goal: 3 ([id number])  Complications: No apparent anesthesia complications

## 2019-10-25 NOTE — Progress Notes (Signed)
PROGRESS NOTE  Holly Salazar [id number] DOB: [birth year] DOA: 10/24/2019 PCP: Kathyrn Lass, MD   LOS: 1 day   Brief Narrative / Interim history: 84 year old female hypertension, osteoporosis, normally walks with a cane at baseline, came into the ED after falling at home.  She landed on the left hip and this was followed by left hip pain and inability to bear weight.  Imaging in the ED showed an impacted left femoral neck fracture.  Orthopedic surgery consult.  Subjective / 24h Interval events: She denies any pain if she is not moving.  No abdominal pain, no nausea or vomiting.  No shortness of breath.  Assessment & Plan: Principal Problem Closed displaced fracture of left femoral neck -Dr. Lorin Mercy with orthopedic surgery consulted plan for operative repair later today -Keep n.p.o., pain control, supportive treatment  Active Problems Essential hypertension -Continue lisinopril  Acute urinary retention -Likely in the setting of #1/pain, patient reports that she has had problems with this in the past when she had a broken leg.  Catheter was unable to be placed by the nursing staff, have consulted urology  Scheduled Meds: . lisinopril  20 mg Oral Daily   Continuous Infusions: PRN Meds:.HYDROcodone-acetaminophen, morphine injection, ondansetron (ZOFRAN) IV  DVT prophylaxis: per ortho Code Status: Full code Family Communication: d/w patient  Patient admitted from: home Anticipated d/c place: anticipate SNF Barriers to d/c: to undergo operative repair later today   Consultants:  Orthopedic surgery  Urology   Procedures:  None   Microbiology  None   Antimicrobials: None     Objective: Vitals:   10/24/19 2045 10/24/19 2123 10/25/19 0513 10/25/19 0812  BP: (!) 134/55 115/62 (!) 136/54 (!) 144/59  Pulse:  84 91 85  Resp: 15 16 16 16   Temp:  98.4 F (36.9 C) 97.9 F (36.6 C) 98.6 F (37 C)  TempSrc:  Oral Oral Oral  SpO2:  97% 98% 99%  Weight:  59.4 kg    Height:   5\' 7"  (1.702 m)     No intake or output data in the 24 hours ending 10/25/19 1059 Filed Weights   10/24/19 2123  Weight: 59.4 kg    Examination:  Constitutional: NAD Eyes: no scleral icterus ENMT: Mucous membranes are moist.  Neck: normal, supple Respiratory: clear to auscultation bilaterally, no wheezing, no crackles. Normal respiratory effort. Cardiovascular: Regular rate and rhythm, no murmurs / rubs / gallops. No LE edema.  Abdomen: non distended, no tenderness. Bowel sounds positive.  Musculoskeletal: no clubbing / cyanosis.  Skin: no rashes Neurologic: Nonfocal, deferred strength in lower extremities due to hip fracture   Data Reviewed: I have independently reviewed following labs and imaging studies   CBC: Recent Labs  Lab 10/24/19 1800  WBC 5.8  NEUTROABS 4.5  HGB 12.1  HCT 37.6  MCV 95.7  PLT [id number]   Basic Metabolic Panel: Recent Labs  Lab 10/24/19 1800  NA 138  K 4.1  CL 100  CO2 29  GLUCOSE 176*  BUN 18  CREATININE 0.84  CALCIUM 8.9   Liver Function Tests: No results for input(s): AST, ALT, ALKPHOS, BILITOT, PROT, ALBUMIN in the last 168 hours. Coagulation Profile: Recent Labs  Lab 10/24/19 1800  INR 1.0   HbA1C: No results for input(s): HGBA1C in the last 72 hours. CBG: No results for input(s): GLUCAP in the last 168 hours.  Recent Results (from the past 240 hour(s))  Respiratory Panel by RT PCR (Flu A&B, Covid) - Nasopharyngeal Swab     Status:  None   Collection Time: 10/24/19  7:11 PM   Specimen: Nasopharyngeal Swab  Result Value Ref Range Status   SARS Coronavirus 2 by RT PCR NEGATIVE NEGATIVE Final    Comment: (NOTE) SARS-CoV-2 target nucleic acids are NOT DETECTED. The SARS-CoV-2 RNA is generally detectable in upper respiratoy specimens during the acute phase of infection. The lowest concentration of SARS-CoV-2 viral copies this assay can detect is 131 copies/mL. A negative result does not preclude SARS-Cov-2 infection and should  not be used as the sole basis for treatment or other patient management decisions. A negative result may occur with  improper specimen collection/handling, submission of specimen other than nasopharyngeal swab, presence of viral mutation(s) within the areas targeted by this assay, and inadequate number of viral copies (<131 copies/mL). A negative result must be combined with clinical observations, patient history, and epidemiological information. The expected result is Negative. Fact Sheet for Patients:  PinkCheek.be Fact Sheet for Healthcare Providers:  GravelBags.it This test is not yet ap proved or cleared by the [place] FDA and  has been authorized for detection and/or diagnosis of SARS-CoV-2 by FDA under an Emergency Use Authorization (EUA). This EUA will remain  in effect (meaning this test can be used) for the duration of the COVID-19 declaration under Section 564(b)(1) of the Act, 21 U.S.C. section 360bbb-3(b)(1), unless the authorization is terminated or revoked sooner.    Influenza A by PCR NEGATIVE NEGATIVE Final   Influenza B by PCR NEGATIVE NEGATIVE Final    Comment: (NOTE) The Xpert Xpress SARS-CoV-2/FLU/RSV assay is intended as an aid in  the diagnosis of influenza from Nasopharyngeal swab specimens and  should not be used as a sole basis for treatment. Nasal washings and  aspirates are unacceptable for Xpert Xpress SARS-CoV-2/FLU/RSV  testing. Fact Sheet for Patients: PinkCheek.be Fact Sheet for Healthcare Providers: GravelBags.it This test is not yet approved or cleared by the [place] FDA and  has been authorized for detection and/or diagnosis of SARS-CoV-2 by  FDA under an Emergency Use Authorization (EUA). This EUA will remain  in effect (meaning this test can be used) for the duration of the  Covid-19 declaration under Section 564(b)(1)  of the Act, 21  U.S.C. section 360bbb-3(b)(1), unless the authorization is  terminated or revoked. Performed at [facility], [place] [address], [place], [place] [address]   Surgical PCR screen     Status: None   Collection Time: 10/25/19  5:12 AM   Specimen: Nasal Mucosa; Nasal Swab  Result Value Ref Range Status   MRSA, PCR NEGATIVE NEGATIVE Final   Staphylococcus aureus NEGATIVE NEGATIVE Final    Comment: (NOTE) The Xpert SA Assay (FDA approved for NASAL specimens in patients 5 years of age and older), is one component of a comprehensive surveillance program. It is not intended to diagnose infection nor to guide or monitor treatment. Performed at [facility], [place] [address], [place], [place] [address]      Radiology Studies: DG Chest 1 View  Result Date: 10/24/2019 CLINICAL DATA:  Hip fracture.  Fall EXAM: CHEST  1 VIEW COMPARISON:  None. FINDINGS: Pulmonary hyperinflation. Lungs clear without infiltrate or effusion. Negative for heart failure. IMPRESSION: COPD without acute abnormality. Electronically Signed   By: Franchot Gallo M.D.   On: 10/24/2019 19:09   DG Hip Unilat W or Wo Pelvis 2-3 Views Left  Result Date: 10/24/2019 CLINICAL DATA:  Fall EXAM: DG HIP (WITH OR WITHOUT PELVIS) 2-3V LEFT COMPARISON:  None.  FINDINGS: Impacted fracture left femoral neck. Left hip joint otherwise normal Surgical repair of right hip fracture with screw and rod. No other pelvic lesion IMPRESSION: Impacted fracture left femoral neck. Electronically Signed   By: Franchot Gallo M.D.   On: 10/24/2019 19:08   DG Femur Min 2 Views Left  Result Date: 10/24/2019 CLINICAL DATA:  Fall EXAM: LEFT FEMUR 2 VIEWS COMPARISON:  None. FINDINGS: Fracture left femoral neck with impaction. No other fracture of the femur Advanced degenerative change in the knee joint with joint space narrowing and chondrocalcinosis IMPRESSION: Left femoral neck impacted fracture. Electronically  Signed   By: Franchot Gallo M.D.   On: 10/24/2019 19:07   Marzetta Board, MD, PhD [facility]  Between 7 am - 7 pm I am available, please contact me via Amion or Securechat  Between 7 pm - 7 am I am not available, please contact night coverage MD/APP via Amion

## 2019-10-25 NOTE — Consult Note (Signed)
Reason for Consult:fall with displaced left hip fracture Referring Physician: Dr. Marzetta Board   Holly Salazar is an 84 y.o. female.  HPI: 84 year old female fell in her house with left intertrochanteric hip fracture.  Originally interpreted by radiologist femoral neck fracture however the lesser trochanter is involved in the fracture consistent with an intertrochanteric hip fracture.  Fracture is displaced, closed and extends laterally to the basilar neck.  Past history of opposite side right intertrochanter fracture fixed with a trochanteric nail in 2016.  She did well after this and required short-term skilled facility stay.  Patient had some past history of hypertension osteoporosis has been relatively healthy had some problems with knee arthritis and low back pain.  Previous carpal tunnel release and a fall with left distal radius fracture.  Patient's only had one fall during the last year which is her current injury.  Past Medical History:  Diagnosis Date  . Arthritis    knee and back  . Cancer ([place])    melanoma on back  . Hypertension   . Osteoporosis     Past Surgical History:  Procedure Laterality Date  . APPENDECTOMY    . CARPAL TUNNEL RELEASE Right   . COLONOSCOPY    . HIP FRACTURE SURGERY Right   . OPEN REDUCTION INTERNAL FIXATION (ORIF) DISTAL RADIAL FRACTURE Left 01/16/2016   Procedure: OPEN REDUCTION INTERNAL FIXATION (ORIF) LEFT  DISTAL RADIUS FRACTURE WITH REPAIR RECONSTRUCTION AS NEEDED ;  Surgeon: Roseanne Kaufman, MD;  Location: [facility];  Service: Orthopedics;  Laterality: Left;  . TONSILLECTOMY      Family History  Problem Relation Age of Onset  . Heart disease Mother   . Cancer Father     Social History:  reports that she has never smoked. She has never used smokeless tobacco. She reports that she does not drink alcohol or use drugs.  Allergies: No Known Allergies  Medications: I have reviewed the patient's current medications.  Results for orders placed  or performed during the hospital encounter of 10/24/19 (from the past 48 hour(s))  Basic metabolic panel     Status: Abnormal   Collection Time: 10/24/19  6:00 PM  Result Value Ref Range   Sodium 138 135 - 145 mmol/L   Potassium 4.1 3.5 - 5.1 mmol/L   Chloride 100 98 - 111 mmol/L   CO2 29 22 - 32 mmol/L   Glucose, Bld 176 (H) 70 - 99 mg/dL    Comment: Glucose reference range applies only to samples taken after fasting for at least 8 hours.   BUN 18 8 - 23 mg/dL   Creatinine, Ser 0.84 0.44 - 1.00 mg/dL   Calcium 8.9 8.9 - 10.3 mg/dL   GFR calc non Af Amer >60 >60 mL/min   GFR calc Af Amer >60 >60 mL/min   Anion gap 9 5 - 15    Comment: Performed at [facility], [place] [address] [place], [place] [address]  CBC with Differential     Status: None   Collection Time: 10/24/19  6:00 PM  Result Value Ref Range   WBC 5.8 4.0 - 10.5 K/uL   RBC 3.93 3.87 - 5.11 MIL/uL   Hemoglobin 12.1 12.0 - 15.0 g/dL   HCT 37.6 36.0 - 46.0 %   MCV 95.7 80.0 - 100.0 fL   MCH 30.8 26.0 - 34.0 pg   MCHC 32.2 30.0 - 36.0 g/dL   RDW 13.6 11.5 - 15.5 %   Platelets 217 150 - 400  K/uL   nRBC 0.0 0.0 - 0.2 %   Neutrophils Relative % 77 %   Neutro Abs 4.5 1.7 - 7.7 K/uL   Lymphocytes Relative 18 %   Lymphs Abs 1.0 0.7 - 4.0 K/uL   Monocytes Relative 5 %   Monocytes Absolute 0.3 0.1 - 1.0 K/uL   Eosinophils Relative 0 %   Eosinophils Absolute 0.0 0.0 - 0.5 K/uL   Basophils Relative 0 %   Basophils Absolute 0.0 0.0 - 0.1 K/uL   Immature Granulocytes 0 %   Abs Immature Granulocytes 0.01 0.00 - 0.07 K/uL    Comment: Performed at [facility], [place] [address] [place], [place] [address]  Protime-INR     Status: None   Collection Time: 10/24/19  6:00 PM  Result Value Ref Range   Prothrombin Time 13.2 11.4 - 15.2 seconds   INR 1.0 0.8 - 1.2    Comment: (NOTE) INR goal varies based on device and disease states. Performed at [place], [place] [address] [place], [place] [address]   Respiratory Panel by RT PCR (Flu A&B, Covid) - Nasopharyngeal Swab     Status: None   Collection Time: 10/24/19  7:11 PM   Specimen: Nasopharyngeal Swab  Result Value Ref Range   SARS Coronavirus 2 by RT PCR NEGATIVE NEGATIVE    Comment: (NOTE) SARS-CoV-2 target nucleic acids are NOT DETECTED. The SARS-CoV-2 RNA is generally detectable in upper respiratoy specimens during the acute phase of infection. The lowest concentration of SARS-CoV-2 viral copies this assay can detect is 131 copies/mL. A negative result does not preclude SARS-Cov-2 infection and should not be used as the sole basis for treatment or other patient management decisions. A negative result may occur with  improper specimen collection/handling, submission of specimen other than nasopharyngeal swab, presence of viral mutation(s) within the areas targeted by this assay, and inadequate number of viral copies (<131 copies/mL). A negative result must be combined with clinical observations, patient history, and epidemiological information. The expected result is Negative. Fact Sheet for Patients:  PinkCheek.be Fact Sheet for Healthcare Providers:  GravelBags.it This test is not yet ap proved or cleared by the [place] FDA and  has been authorized for detection and/or diagnosis of SARS-CoV-2 by FDA under an Emergency Use Authorization (EUA). This EUA will remain  in effect (meaning this test can be used) for the duration of the COVID-19 declaration under Section 564(b)(1) of the Act, 21 U.S.C. section 360bbb-3(b)(1), unless the authorization is terminated or revoked sooner.    Influenza A by PCR NEGATIVE NEGATIVE   Influenza B by PCR NEGATIVE NEGATIVE    Comment: (NOTE) The Xpert Xpress SARS-CoV-2/FLU/RSV assay is intended as an aid in  the diagnosis of influenza from Nasopharyngeal swab specimens and  should not be used as a  sole basis for treatment. Nasal washings and  aspirates are unacceptable for Xpert Xpress SARS-CoV-2/FLU/RSV  testing. Fact Sheet for Patients: PinkCheek.be Fact Sheet for Healthcare Providers: GravelBags.it This test is not yet approved or cleared by the [place] FDA and  has been authorized for detection and/or diagnosis of SARS-CoV-2 by  FDA under an Emergency Use Authorization (EUA). This EUA will remain  in effect (meaning this test can be used) for the duration of the  Covid-19 declaration under Section 564(b)(1) of the Act, 21  U.S.C. section 360bbb-3(b)(1), unless the authorization is  terminated or revoked. Performed at [facility], [place] [address] [place], [place] [address]  Type and screen [place]     Status: None   Collection Time: 10/24/19  8:36 PM  Result Value Ref Range   ABO/RH(D) A NEG    Antibody Screen NEG    Sample Expiration      10/27/2019,2359 Performed at [facility], [place] [address], [place], [place] [address]   ABO/Rh     Status: None (Preliminary result)   Collection Time: 10/24/19  8:36 PM  Result Value Ref Range   ABO/RH(D)      A NEG Performed at [facility], [place] [address] [place], [place] [address]   Surgical PCR screen     Status: None   Collection Time: 10/25/19  5:12 AM   Specimen: Nasal Mucosa; Nasal Swab  Result Value Ref Range   MRSA, PCR NEGATIVE NEGATIVE   Staphylococcus aureus NEGATIVE NEGATIVE    Comment: (NOTE) The Xpert SA Assay (FDA approved for NASAL specimens in patients 77 years of age and older), is one component of a comprehensive surveillance program. It is not intended to diagnose infection nor to guide or monitor treatment. Performed at [facility], [place] [address], [place], [place] [address]     DG Chest 1 View  Result Date: 10/24/2019 CLINICAL DATA:   Hip fracture.  Fall EXAM: CHEST  1 VIEW COMPARISON:  None. FINDINGS: Pulmonary hyperinflation. Lungs clear without infiltrate or effusion. Negative for heart failure. IMPRESSION: COPD without acute abnormality. Electronically Signed   By: Franchot Gallo M.D.   On: 10/24/2019 19:09   DG Hip Unilat W or Wo Pelvis 2-3 Views Left  Result Date: 10/24/2019 CLINICAL DATA:  Fall EXAM: DG HIP (WITH OR WITHOUT PELVIS) 2-3V LEFT COMPARISON:  None. FINDINGS: Impacted fracture left femoral neck. Left hip joint otherwise normal Surgical repair of right hip fracture with screw and rod. No other pelvic lesion IMPRESSION: Impacted fracture left femoral neck. Electronically Signed   By: Franchot Gallo M.D.   On: 10/24/2019 19:08   DG Femur Min 2 Views Left  Result Date: 10/24/2019 CLINICAL DATA:  Fall EXAM: LEFT FEMUR 2 VIEWS COMPARISON:  None. FINDINGS: Fracture left femoral neck with impaction. No other fracture of the femur Advanced degenerative change in the knee joint with joint space narrowing and chondrocalcinosis IMPRESSION: Left femoral neck impacted fracture. Electronically Signed   By: Franchot Gallo M.D.   On: 10/24/2019 19:07    ROS negative for respiratory problems.  Previous appendectomy previous carpal tunnel release left distal radius fracture 2017 by Dr. [name].  Previous tonsillectomy.  No cardiac problems non-smoker.  Previous right hip fracture.  She was a household ambulator prior to her current fall. Blood pressure (!) 136/54, pulse 91, temperature 97.9 F (36.6 C), temperature source Oral, resp. rate 16, height 5\' 7"  (1.702 m), weight 59.4 kg, SpO2 98 %. Physical Exam  Constitutional: She is oriented to person, place, and time. She appears well-developed and well-nourished.  HENT:  Head: Normocephalic and atraumatic.  Eyes: Pupils are equal, round, and reactive to light. Conjunctivae are normal. No scleral icterus.  Neck: No tracheal deviation present. No thyromegaly present.  Cardiovascular:  Normal rate and regular rhythm.  Respiratory: Effort normal. No respiratory distress. She has no wheezes.  GI: Soft. She exhibits no distension.  Musculoskeletal:     Cervical back: Normal range of motion.  Neurological: She is alert and oriented to person, place, and time.  Skin: Skin is dry.  Psychiatric: She has a normal mood  and affect. Her behavior is normal. Judgment and thought content normal.    Assessment/Plan: 84 year old female with left intertrochanteric hip fracture.  Plan trochanteric nail fixation for her displaced left hip fracture.  We discussed postoperative rehab which she is familiar with should be partial weightbearing for period of about 6 weeks and then should be able be full weightbearing.  Skilled facility will be any needed patient related to me.  Choice anesthesia spinal versus general.  Questions were elicited and answered she understands quest we proceed.  Holly Salazar 10/25/2019, 8:09 AM

## 2019-10-25 NOTE — Interval H&P Note (Signed)
History and Physical Interval Note:  10/25/2019 7:35 PM  Holly Salazar  has presented today for surgery, with the diagnosis of left hip fracture.  The various methods of treatment have been discussed with the patient and family. After consideration of risks, benefits and other options for treatment, the patient has consented to  Procedure(s): INTRAMEDULLARY (IM) NAIL FEMORAL (Left) as a surgical intervention.  The patient's history has been reviewed, patient examined, no change in status, stable for surgery.  I have reviewed the patient's chart and labs.  Questions were answered to the patient's satisfaction.     [name]

## 2019-10-25 NOTE — Plan of Care (Signed)

## 2019-10-25 NOTE — H&P (View-Only) (Signed)
Reason for Consult:fall with displaced left hip fracture Referring Physician: Dr. Marzetta Board   Holly Salazar is an 84 y.o. female.  HPI: 84 year old female fell in her house with left intertrochanteric hip fracture.  Originally interpreted by radiologist femoral neck fracture however the lesser trochanter is involved in the fracture consistent with an intertrochanteric hip fracture.  Fracture is displaced, closed and extends laterally to the basilar neck.  Past history of opposite side right intertrochanter fracture fixed with a trochanteric nail in 2016.  She did well after this and required short-term skilled facility stay.  Patient had some past history of hypertension osteoporosis has been relatively healthy had some problems with knee arthritis and low back pain.  Previous carpal tunnel release and a fall with left distal radius fracture.  Patient's only had one fall during the last year which is her current injury.  Past Medical History:  Diagnosis Date  . Arthritis    knee and back  . Cancer ([place])    melanoma on back  . Hypertension   . Osteoporosis     Past Surgical History:  Procedure Laterality Date  . APPENDECTOMY    . CARPAL TUNNEL RELEASE Right   . COLONOSCOPY    . HIP FRACTURE SURGERY Right   . OPEN REDUCTION INTERNAL FIXATION (ORIF) DISTAL RADIAL FRACTURE Left 01/16/2016   Procedure: OPEN REDUCTION INTERNAL FIXATION (ORIF) LEFT  DISTAL RADIUS FRACTURE WITH REPAIR RECONSTRUCTION AS NEEDED ;  Surgeon: Roseanne Kaufman, MD;  Location: [facility];  Service: Orthopedics;  Laterality: Left;  . TONSILLECTOMY      Family History  Problem Relation Age of Onset  . Heart disease Mother   . Cancer Father     Social History:  reports that she has never smoked. She has never used smokeless tobacco. She reports that she does not drink alcohol or use drugs.  Allergies: No Known Allergies  Medications: I have reviewed the patient's current medications.  Results for orders placed  or performed during the hospital encounter of 10/24/19 (from the past 48 hour(s))  Basic metabolic panel     Status: Abnormal   Collection Time: 10/24/19  6:00 PM  Result Value Ref Range   Sodium 138 135 - 145 mmol/L   Potassium 4.1 3.5 - 5.1 mmol/L   Chloride 100 98 - 111 mmol/L   CO2 29 22 - 32 mmol/L   Glucose, Bld 176 (H) 70 - 99 mg/dL    Comment: Glucose reference range applies only to samples taken after fasting for at least 8 hours.   BUN 18 8 - 23 mg/dL   Creatinine, Ser 0.84 0.44 - 1.00 mg/dL   Calcium 8.9 8.9 - 10.3 mg/dL   GFR calc non Af Amer >60 >60 mL/min   GFR calc Af Amer >60 >60 mL/min   Anion gap 9 5 - 15    Comment: Performed at [facility], [place] [address], [place], [place] [address]  CBC with Differential     Status: None   Collection Time: 10/24/19  6:00 PM  Result Value Ref Range   WBC 5.8 4.0 - 10.5 K/uL   RBC 3.93 3.87 - 5.11 MIL/uL   Hemoglobin 12.1 12.0 - 15.0 g/dL   HCT 37.6 36.0 - 46.0 %   MCV 95.7 80.0 - 100.0 fL   MCH 30.8 26.0 - 34.0 pg   MCHC 32.2 30.0 - 36.0 g/dL   RDW 13.6 11.5 - 15.5 %   Platelets 217 150 - 400  K/uL   nRBC 0.0 0.0 - 0.2 %   Neutrophils Relative % 77 %   Neutro Abs 4.5 1.7 - 7.7 K/uL   Lymphocytes Relative 18 %   Lymphs Abs 1.0 0.7 - 4.0 K/uL   Monocytes Relative 5 %   Monocytes Absolute 0.3 0.1 - 1.0 K/uL   Eosinophils Relative 0 %   Eosinophils Absolute 0.0 0.0 - 0.5 K/uL   Basophils Relative 0 %   Basophils Absolute 0.0 0.0 - 0.1 K/uL   Immature Granulocytes 0 %   Abs Immature Granulocytes 0.01 0.00 - 0.07 K/uL    Comment: Performed at [facility], [place] [address], [place], [place] [address]  Protime-INR     Status: None   Collection Time: 10/24/19  6:00 PM  Result Value Ref Range   Prothrombin Time 13.2 11.4 - 15.2 seconds   INR 1.0 0.8 - 1.2    Comment: (NOTE) INR goal varies based on device and disease states. Performed at [facility], [place] [address] [place], [place] [address]   Respiratory Panel by RT PCR (Flu A&B, Covid) - Nasopharyngeal Swab     Status: None   Collection Time: 10/24/19  7:11 PM   Specimen: Nasopharyngeal Swab  Result Value Ref Range   SARS Coronavirus 2 by RT PCR NEGATIVE NEGATIVE    Comment: (NOTE) SARS-CoV-2 target nucleic acids are NOT DETECTED. The SARS-CoV-2 RNA is generally detectable in upper respiratoy specimens during the acute phase of infection. The lowest concentration of SARS-CoV-2 viral copies this assay can detect is 131 copies/mL. A negative result does not preclude SARS-Cov-2 infection and should not be used as the sole basis for treatment or other patient management decisions. A negative result may occur with  improper specimen collection/handling, submission of specimen other than nasopharyngeal swab, presence of viral mutation(s) within the areas targeted by this assay, and inadequate number of viral copies (<131 copies/mL). A negative result must be combined with clinical observations, patient history, and epidemiological information. The expected result is Negative. Fact Sheet for Patients:  PinkCheek.be Fact Sheet for Healthcare Providers:  GravelBags.it This test is not yet ap proved or cleared by the [place] FDA and  has been authorized for detection and/or diagnosis of SARS-CoV-2 by FDA under an Emergency Use Authorization (EUA). This EUA will remain  in effect (meaning this test can be used) for the duration of the COVID-19 declaration under Section 564(b)(1) of the Act, 21 U.S.C. section 360bbb-3(b)(1), unless the authorization is terminated or revoked sooner.    Influenza A by PCR NEGATIVE NEGATIVE   Influenza B by PCR NEGATIVE NEGATIVE    Comment: (NOTE) The Xpert Xpress SARS-CoV-2/FLU/RSV assay is intended as an aid in  the diagnosis of influenza from Nasopharyngeal swab specimens and  should not be used as a  sole basis for treatment. Nasal washings and  aspirates are unacceptable for Xpert Xpress SARS-CoV-2/FLU/RSV  testing. Fact Sheet for Patients: PinkCheek.be Fact Sheet for Healthcare Providers: GravelBags.it This test is not yet approved or cleared by the [place] FDA and  has been authorized for detection and/or diagnosis of SARS-CoV-2 by  FDA under an Emergency Use Authorization (EUA). This EUA will remain  in effect (meaning this test can be used) for the duration of the  Covid-19 declaration under Section 564(b)(1) of the Act, 21  U.S.C. section 360bbb-3(b)(1), unless the authorization is  terminated or revoked. Performed at [facility], [place] [address], [place], [place] [address]  Type and screen [place]     Status: None   Collection Time: 10/24/19  8:36 PM  Result Value Ref Range   ABO/RH(D) A NEG    Antibody Screen NEG    Sample Expiration      10/27/2019,2359 Performed at [facility], [place] [address], [place], [place] [address]   ABO/Rh     Status: None (Preliminary result)   Collection Time: 10/24/19  8:36 PM  Result Value Ref Range   ABO/RH(D)      A NEG Performed at [facility], [place] [address], [place], [place] [address]   Surgical PCR screen     Status: None   Collection Time: 10/25/19  5:12 AM   Specimen: Nasal Mucosa; Nasal Swab  Result Value Ref Range   MRSA, PCR NEGATIVE NEGATIVE   Staphylococcus aureus NEGATIVE NEGATIVE    Comment: (NOTE) The Xpert SA Assay (FDA approved for NASAL specimens in patients 71 years of age and older), is one component of a comprehensive surveillance program. It is not intended to diagnose infection nor to guide or monitor treatment. Performed at [facility], [place] [address] [place], [place] [address]     DG Chest 1 View  Result Date: 10/24/2019 CLINICAL DATA:   Hip fracture.  Fall EXAM: CHEST  1 VIEW COMPARISON:  None. FINDINGS: Pulmonary hyperinflation. Lungs clear without infiltrate or effusion. Negative for heart failure. IMPRESSION: COPD without acute abnormality. Electronically Signed   By: Franchot Gallo M.D.   On: 10/24/2019 19:09   DG Hip Unilat W or Wo Pelvis 2-3 Views Left  Result Date: 10/24/2019 CLINICAL DATA:  Fall EXAM: DG HIP (WITH OR WITHOUT PELVIS) 2-3V LEFT COMPARISON:  None. FINDINGS: Impacted fracture left femoral neck. Left hip joint otherwise normal Surgical repair of right hip fracture with screw and rod. No other pelvic lesion IMPRESSION: Impacted fracture left femoral neck. Electronically Signed   By: Franchot Gallo M.D.   On: 10/24/2019 19:08   DG Femur Min 2 Views Left  Result Date: 10/24/2019 CLINICAL DATA:  Fall EXAM: LEFT FEMUR 2 VIEWS COMPARISON:  None. FINDINGS: Fracture left femoral neck with impaction. No other fracture of the femur Advanced degenerative change in the knee joint with joint space narrowing and chondrocalcinosis IMPRESSION: Left femoral neck impacted fracture. Electronically Signed   By: Franchot Gallo M.D.   On: 10/24/2019 19:07    ROS negative for respiratory problems.  Previous appendectomy previous carpal tunnel release left distal radius fracture 2017 by Dr. [name].  Previous tonsillectomy.  No cardiac problems non-smoker.  Previous right hip fracture.  She was a household ambulator prior to her current fall. Blood pressure (!) 136/54, pulse 91, temperature 97.9 F (36.6 C), temperature source Oral, resp. rate 16, height 5\' 7"  (1.702 m), weight 59.4 kg, SpO2 98 %. Physical Exam  Constitutional: She is oriented to person, place, and time. She appears well-developed and well-nourished.  HENT:  Head: Normocephalic and atraumatic.  Eyes: Pupils are equal, round, and reactive to light. Conjunctivae are normal. No scleral icterus.  Neck: No tracheal deviation present. No thyromegaly present.  Cardiovascular:  Normal rate and regular rhythm.  Respiratory: Effort normal. No respiratory distress. She has no wheezes.  GI: Soft. She exhibits no distension.  Musculoskeletal:     Cervical back: Normal range of motion.  Neurological: She is alert and oriented to person, place, and time.  Skin: Skin is dry.  Psychiatric: She has a normal mood  and affect. Her behavior is normal. Judgment and thought content normal.    Assessment/Plan: 84 year old female with left intertrochanteric hip fracture.  Plan trochanteric nail fixation for her displaced left hip fracture.  We discussed postoperative rehab which she is familiar with should be partial weightbearing for period of about 6 weeks and then should be able be full weightbearing.  Skilled facility will be any needed patient related to me.  Choice anesthesia spinal versus general.  Questions were elicited and answered she understands quest we proceed.  [name] 10/25/2019, 8:09 AM

## 2019-10-25 NOTE — Consult Note (Signed)
Urology Consult   Physician requesting consult: Dr. Cruzita Lederer  Reason for consult: Urinary retention, difficult urethral catheterization  History of Present Illness: Holly Salazar is a 84 y.o. who was noted to have a bladder scan of 500 cc despite no urgency or discomfort.  Attempts to insert a catheter by multiple nursing staff have been unsuccessful.  She has a left hip fracture and is scheduled for surgery later today.  She denies a history of difficulty voiding or difficult catheter placement.    Past Medical History:  Diagnosis Date  . Arthritis    knee and back  . Cancer ([place])    melanoma on back  . Hypertension   . Osteoporosis     Past Surgical History:  Procedure Laterality Date  . APPENDECTOMY    . CARPAL TUNNEL RELEASE Right   . COLONOSCOPY    . HIP FRACTURE SURGERY Right   . OPEN REDUCTION INTERNAL FIXATION (ORIF) DISTAL RADIAL FRACTURE Left 01/16/2016   Procedure: OPEN REDUCTION INTERNAL FIXATION (ORIF) LEFT  DISTAL RADIUS FRACTURE WITH REPAIR RECONSTRUCTION AS NEEDED ;  Surgeon: Roseanne Kaufman, MD;  Location: [facility];  Service: Orthopedics;  Laterality: Left;  . TONSILLECTOMY       Current Hospital Medications:  Home meds:  No current facility-administered medications on file prior to encounter.   Current Outpatient Medications on File Prior to Encounter  Medication Sig Dispense Refill  . lisinopril (PRINIVIL,ZESTRIL) 20 MG tablet Take 20 mg by mouth daily.    . Multiple Vitamin (MULTIVITAMIN WITH MINERALS) TABS tablet Take 1 tablet by mouth daily.    . naproxen sodium (ANAPROX) 220 MG tablet Take 220 mg by mouth daily as needed (pain).       Scheduled Meds: . lisinopril  20 mg Oral Daily   Continuous Infusions: PRN Meds:.HYDROcodone-acetaminophen, morphine injection, ondansetron (ZOFRAN) IV  Allergies: No Known Allergies  Family History  Problem Relation Age of Onset  . Heart disease Mother   . Cancer Father     Social History:  reports that she  has never smoked. She has never used smokeless tobacco. She reports that she does not drink alcohol or use drugs.  ROS: A complete review of systems was performed.  All systems are negative except for pertinent findings as noted.  Physical Exam:  Vital signs in last 24 hours: Temp:  [97.9 F (36.6 C)-98.8 F (37.1 C)] 98.6 F (37 C) (03/03 0812) Pulse Rate:  [84-91] 85 (03/03 0812) Resp:  [10-24] 16 (03/03 0812) BP: (113-145)/(54-68) 144/59 (03/03 0812) SpO2:  [97 %-100 %] 99 % (03/03 0812) Weight:  [59.4 kg] 59.4 kg (03/02 2123) Constitutional:  Alert and oriented, No acute distress Cardiovascular:  No JVD Respiratory: Normal respiratory effort GI: Mild SP distention GU: No CVA tenderness, Urethral is retracted, Vaginal atrophy Lymphatic: No lymphadenopathy Neurologic: Grossly intact, no focal deficits Psychiatric: Normal mood and affect  Laboratory Data:  Recent Labs    10/24/19 1800  WBC 5.8  HGB 12.1  HCT 37.6  PLT 217    Recent Labs    10/24/19 1800  NA 138  K 4.1  CL 100  GLUCOSE 176*  BUN 18  CALCIUM 8.9  CREATININE 0.84     Results for orders placed or performed during the hospital encounter of 10/24/19 (from the past 24 hour(s))  Basic metabolic panel     Status: Abnormal   Collection Time: 10/24/19  6:00 PM  Result Value Ref Range   Sodium 138 135 - 145 mmol/L  Potassium 4.1 3.5 - 5.1 mmol/L   Chloride 100 98 - 111 mmol/L   CO2 29 22 - 32 mmol/L   Glucose, Bld 176 (H) 70 - 99 mg/dL   BUN 18 8 - 23 mg/dL   Creatinine, Ser 0.84 0.44 - 1.00 mg/dL   Calcium 8.9 8.9 - 10.3 mg/dL   GFR calc non Af Amer >60 >60 mL/min   GFR calc Af Amer >60 >60 mL/min   Anion gap 9 5 - 15  CBC with Differential     Status: None   Collection Time: 10/24/19  6:00 PM  Result Value Ref Range   WBC 5.8 4.0 - 10.5 K/uL   RBC 3.93 3.87 - 5.11 MIL/uL   Hemoglobin 12.1 12.0 - 15.0 g/dL   HCT 37.6 36.0 - 46.0 %   MCV 95.7 80.0 - 100.0 fL   MCH 30.8 26.0 - 34.0 pg    MCHC 32.2 30.0 - 36.0 g/dL   RDW 13.6 11.5 - 15.5 %   Platelets 217 150 - 400 K/uL   nRBC 0.0 0.0 - 0.2 %   Neutrophils Relative % 77 %   Neutro Abs 4.5 1.7 - 7.7 K/uL   Lymphocytes Relative 18 %   Lymphs Abs 1.0 0.7 - 4.0 K/uL   Monocytes Relative 5 %   Monocytes Absolute 0.3 0.1 - 1.0 K/uL   Eosinophils Relative 0 %   Eosinophils Absolute 0.0 0.0 - 0.5 K/uL   Basophils Relative 0 %   Basophils Absolute 0.0 0.0 - 0.1 K/uL   Immature Granulocytes 0 %   Abs Immature Granulocytes 0.01 0.00 - 0.07 K/uL  Protime-INR     Status: None   Collection Time: 10/24/19  6:00 PM  Result Value Ref Range   Prothrombin Time 13.2 11.4 - 15.2 seconds   INR 1.0 0.8 - 1.2  Respiratory Panel by RT PCR (Flu A&B, Covid) - Nasopharyngeal Swab     Status: None   Collection Time: 10/24/19  7:11 PM   Specimen: Nasopharyngeal Swab  Result Value Ref Range   SARS Coronavirus 2 by RT PCR NEGATIVE NEGATIVE   Influenza A by PCR NEGATIVE NEGATIVE   Influenza B by PCR NEGATIVE NEGATIVE  Type and screen [place]     Status: None   Collection Time: 10/24/19  8:36 PM  Result Value Ref Range   ABO/RH(D) A NEG    Antibody Screen NEG    Sample Expiration      10/27/2019,2359 Performed at [facility], [place] [address] [place], [place] [address]   ABO/Rh     Status: None   Collection Time: 10/24/19  8:36 PM  Result Value Ref Range   ABO/RH(D)      A NEG Performed at [facility] [address], [place],  [address]   Surgical PCR screen     Status: None   Collection Time: 10/25/19  5:12 AM   Specimen: Nasal Mucosa; Nasal Swab  Result Value Ref Range   MRSA, PCR NEGATIVE NEGATIVE   Staphylococcus aureus NEGATIVE NEGATIVE   Recent Results (from the past 240 hour(s))  Respiratory Panel by RT PCR (Flu A&B, Covid) - Nasopharyngeal Swab     Status: None   Collection Time: 10/24/19  7:11 PM   Specimen: Nasopharyngeal Swab  Result Value Ref Range  Status   SARS Coronavirus 2 by RT PCR NEGATIVE NEGATIVE Final    Comment: (NOTE) SARS-CoV-2 target nucleic acids are NOT DETECTED. The  SARS-CoV-2 RNA is generally detectable in upper respiratoy specimens during the acute phase of infection. The lowest concentration of SARS-CoV-2 viral copies this assay can detect is 131 copies/mL. A negative result does not preclude SARS-Cov-2 infection and should not be used as the sole basis for treatment or other patient management decisions. A negative result may occur with  improper specimen collection/handling, submission of specimen other than nasopharyngeal swab, presence of viral mutation(s) within the areas targeted by this assay, and inadequate number of viral copies (<131 copies/mL). A negative result must be combined with clinical observations, patient history, and epidemiological information. The expected result is Negative. Fact Sheet for Patients:  PinkCheek.be Fact Sheet for Healthcare Providers:  GravelBags.it This test is not yet ap proved or cleared by the [place] FDA and  has been authorized for detection and/or diagnosis of SARS-CoV-2 by FDA under an Emergency Use Authorization (EUA). This EUA will remain  in effect (meaning this test can be used) for the duration of the COVID-19 declaration under Section 564(b)(1) of the Act, 21 U.S.C. section 360bbb-3(b)(1), unless the authorization is terminated or revoked sooner.    Influenza A by PCR NEGATIVE NEGATIVE Final   Influenza B by PCR NEGATIVE NEGATIVE Final    Comment: (NOTE) The Xpert Xpress SARS-CoV-2/FLU/RSV assay is intended as an aid in  the diagnosis of influenza from Nasopharyngeal swab specimens and  should not be used as a sole basis for treatment. Nasal washings and  aspirates are unacceptable for Xpert Xpress SARS-CoV-2/FLU/RSV  testing. Fact Sheet for  Patients: PinkCheek.be Fact Sheet for Healthcare Providers: GravelBags.it This test is not yet approved or cleared by the [place] FDA and  has been authorized for detection and/or diagnosis of SARS-CoV-2 by  FDA under an Emergency Use Authorization (EUA). This EUA will remain  in effect (meaning this test can be used) for the duration of the  Covid-19 declaration under Section 564(b)(1) of the Act, 21  U.S.C. section 360bbb-3(b)(1), unless the authorization is  terminated or revoked. Performed at [facility], [place] [address] [place], [place] [address]   Surgical PCR screen     Status: None   Collection Time: 10/25/19  5:12 AM   Specimen: Nasal Mucosa; Nasal Swab  Result Value Ref Range Status   MRSA, PCR NEGATIVE NEGATIVE Final   Staphylococcus aureus NEGATIVE NEGATIVE Final    Comment: (NOTE) The Xpert SA Assay (FDA approved for NASAL specimens in patients 81 years of age and older), is one component of a comprehensive surveillance program. It is not intended to diagnose infection nor to guide or monitor treatment. Performed at [facility], [place] [address], [place], [place] [address]     Renal Function: Recent Labs    10/24/19 1800  CREATININE 0.84   Estimated Creatinine Clearance: 49.3 mL/min (by C-G formula based on SCr of 0.84 mg/dL).  Procedure: I attempted to insert a 60 French Foley catheter using a finger to guide the catheter is urethra was not able to be identified.  She was prepped with Betadine but visualization was extremely poor due to the fact that she was unable to move her left hip.  With vaginal atrophy and a retracted urethra, I attempted to insert a Foley catheter using my index finger to guide the catheter.  This was unsuccessful.  I then tried a 70 [place] coude catheter and guided this and was also unsuccessful.  Considering she is planning on going to the  operating room later today  and is not currently on comfortable, I will plan to make another attempt to place a catheter at that time under better visualization.    Impression/Recommendation:   1.  Urinary retention/ difficult urethral catheterization: I was unable to place a catheter at bedside considering limited visualization due to her positioning.  Since she is not uncomfortable and is planning on going to the operating room later today, I have asked the operating room to notify me when she rise in the operating room.  I will then attempt to place urethral catheter at that time which should allow for better visualization and less discomfort for the patient.  Dutch Gray 10/25/2019, 10:14 AM  Pryor Curia. MD   CC: Dr. Cruzita Lederer

## 2019-10-25 NOTE — Progress Notes (Signed)
Pt unable to urinate since she arrived to the unit. Bladder scan showed 513cc. MD on call notified.

## 2019-10-25 NOTE — Progress Notes (Signed)
Me and another nurse attempted doing in and out catheter. No success. Will pass on to day shift to attempt.

## 2019-10-25 NOTE — Progress Notes (Signed)
   10/25/19 1100  Clinical Encounter Type  Visited With Patient  Visit Type Initial;Psychological support;Spiritual support  Referral From Chaplain  Consult/Referral To Chaplain  Spiritual Encounters  Spiritual Needs Emotional;Prayer;Other (Comment) (Spiritual Care Conversation/Support)  Stress Factors  Patient Stress Factors Health changes;Other (Comment) (Surgery )   I spoke with Holly Salazar per referral from our night Chaplain. Holly Salazar was in distress at the time that I spoke with her, because she had dropped her alarm to call the nurse and had urinated on herself. I called the nursing station and let them know.  I also assessed her spiritual needs. Holly Salazar would like a Chaplain to come and pray with her before her surgery this afternoon. We will follow up this afternoon and provide support.    Chaplain Shanon Ace M.Div., [facility]

## 2019-10-26 ENCOUNTER — Encounter: Payer: Self-pay | Admitting: *Deleted

## 2019-10-26 DIAGNOSIS — E43 Unspecified severe protein-calorie malnutrition: Secondary | ICD-10-CM | POA: Insufficient documentation

## 2019-10-26 LAB — COMPREHENSIVE METABOLIC PANEL
ALT: 21 U/L (ref 0–44)
AST: 25 U/L (ref 15–41)
Albumin: 3.4 g/dL — ABNORMAL LOW (ref 3.5–5.0)
Alkaline Phosphatase: 46 U/L (ref 38–126)
Anion gap: 10 (ref 5–15)
BUN: 17 mg/dL (ref 8–23)
CO2: 23 mmol/L (ref 22–32)
Calcium: 8.5 mg/dL — ABNORMAL LOW (ref 8.9–10.3)
Chloride: 103 mmol/L (ref 98–111)
Creatinine, Ser: 0.65 mg/dL (ref 0.44–1.00)
GFR calc Af Amer: >60 mL/min (ref >60)
GFR calc non Af Amer: >60 mL/min (ref >60)
Glucose, Bld: 165 mg/dL — ABNORMAL HIGH (ref 70–99)
Potassium: 4.3 mmol/L (ref 3.5–5.1)
Sodium: 136 mmol/L (ref 135–145)
Total Bilirubin: 0.8 mg/dL (ref 0.3–1.2)
Total Protein: 6.1 g/dL — ABNORMAL LOW (ref 6.5–8.1)

## 2019-10-26 LAB — URINALYSIS, ROUTINE W REFLEX MICROSCOPIC
Bilirubin Urine: NEGATIVE
Glucose, UA: NEGATIVE mg/dL
Hgb urine dipstick: NEGATIVE
Ketones, ur: NEGATIVE mg/dL
Leukocytes,Ua: NEGATIVE
Nitrite: NEGATIVE
Protein, ur: NEGATIVE mg/dL
Specific Gravity, Urine: 1.014 (ref 1.005–1.030)
pH: 7 (ref 5.0–8.0)

## 2019-10-26 LAB — URINE CULTURE: Culture: NO GROWTH

## 2019-10-26 LAB — CBC
HCT: 32.3 % — ABNORMAL LOW (ref 36.0–46.0)
Hemoglobin: 10.4 g/dL — ABNORMAL LOW (ref 12.0–15.0)
MCH: 30.6 pg (ref 26.0–34.0)
MCHC: 32.2 g/dL (ref 30.0–36.0)
MCV: 95 fL (ref 80.0–100.0)
Platelets: 164 10*3/uL (ref 150–400)
RBC: 3.4 MIL/uL — ABNORMAL LOW (ref 3.87–5.11)
RDW: 13.5 % (ref 11.5–15.5)
WBC: 9.5 10*3/uL (ref 4.0–10.5)
nRBC: 0 % (ref 0.0–0.2)

## 2019-10-26 MED ORDER — QUETIAPINE FUMARATE 25 MG PO TABS: 12.5000 mg | ORAL_TABLET | Freq: Two times a day (BID) | ORAL | Status: DC

## 2019-10-26 MED ORDER — LACTATED RINGERS IV SOLN: INTRAVENOUS | Status: AC

## 2019-10-26 MED ORDER — QUETIAPINE FUMARATE 25 MG PO TABS
12.5000 mg | ORAL_TABLET | Freq: Every day | ORAL | Status: DC
  Administered 2019-10-26 – 2019-10-28 (×3): 12.5 mg via ORAL
  Filled 2019-10-26 (×3): qty 1

## 2019-10-26 MED ORDER — ACETAMINOPHEN 10 MG/ML IV SOLN
1000.0000 mg | Freq: Once | INTRAVENOUS | Status: AC
  Administered 2019-10-26: 1000 mg via INTRAVENOUS
  Filled 2019-10-26: qty 100

## 2019-10-26 MED ORDER — HALOPERIDOL LACTATE 5 MG/ML IJ SOLN
2.0000 mg | Freq: Four times a day (QID) | INTRAMUSCULAR | Status: DC | PRN
  Administered 2019-10-26: 2 mg via INTRAVENOUS
  Filled 2019-10-26: qty 1

## 2019-10-26 MED ORDER — CHLORHEXIDINE GLUCONATE CLOTH 2 % EX PADS
6.0000 | MEDICATED_PAD | Freq: Every day | CUTANEOUS | Status: DC
  Administered 2019-10-27 – 2019-10-30 (×4): 6 via TOPICAL

## 2019-10-26 NOTE — Progress Notes (Signed)
Dr Cruzita Lederer notified of patient's refusal of medication. Donne [place], RN

## 2019-10-26 NOTE — Progress Notes (Signed)
Updated On-Call surgeon Regarding continued calling out swearing and disoriented condition.  Refusing all attempts to give fluids/meds PO. Received order for LR @ 75 and 1GM tylenol IV X 1 and may renew soft wrist restraints X 1 for 4 hours.

## 2019-10-26 NOTE — Progress Notes (Signed)
Patient ID: Holly Salazar, female   DOB: 1936-03-22, 84 y.o.   MRN: [id number]  1 Day Post-Op Subjective: Pt s/p catheter placement yesterday for urinary retention.  No new complaints.  S/p repair of left hip fracture last evening.  Objective: Vital signs in last 24 hours: Temp:  [97.5 F (36.4 C)-99.6 F (37.6 C)] 98 F (36.7 C) (03/04 0558) Pulse Rate:  [73-108] 94 (03/04 0558) Resp:  [12-19] 18 (03/04 0558) BP: (108-144)/(55-86) 144/63 (03/04 0558) SpO2:  [91 %-100 %] 100 % (03/04 0558) Weight:  [59.4 kg] 59.4 kg (03/03 1820)  Intake/Output from previous day: 03/03 0701 - 03/04 0700 In: 1847.1 [P.O.:210; I.V.:1387.1; IV Piggyback:250] Out: [id number] [Urine:1450; Blood:20] Intake/Output this shift: No intake/output data recorded.  Physical Exam:  General: Alert and oriented   Lab Results: Recent Labs    10/24/19 1800 10/26/19 0348  HGB 12.1 10.4*  HCT 37.6 32.3*   BMET Recent Labs    10/24/19 1800 10/26/19 0348  NA 138 136  K 4.1 4.3  CL 100 103  CO2 29 23  GLUCOSE 176* 165*  BUN 18 17  CREATININE 0.84 0.65  CALCIUM 8.9 8.5*     Studies/Results:  Assessment/Plan: Urinary retention: Unclear etiology for urinary retention.  Would keep Foley for now since replacement of a catheter will not be easy in setting of left lower extremity immobility.    LOS: 2 days   Dutch Gray 10/26/2019, 2:09 PM

## 2019-10-26 NOTE — Progress Notes (Signed)
OT Cancellation Note  Patient Details Name: Holly Salazar MRN: [id number] DOB: June 07, 1936   Cancelled Treatment:    Reason Eval/Treat Not Completed: Other (comment)(Behavioral issues which prohibit skilled therapy services). Patient requires wrist restraints and haldol. Will attempt evaluation on different date and time.  Romaine Neville OTR/L   Karee Forge 10/26/2019, 12:18 PM

## 2019-10-26 NOTE — Progress Notes (Addendum)
PROGRESS NOTE  Holly Salazar [id number] DOB: Jun 01, 1936 DOA: 10/24/2019 PCP: Kathyrn Lass, MD   LOS: 2 days   Brief Narrative / Interim history: 84 year old female hypertension, osteoporosis, normally walks with a cane at baseline, came into the ED after falling at home.  She landed on the left hip and this was followed by left hip pain and inability to bear weight.  Imaging in the ED showed an impacted left femoral neck fracture.  Orthopedic surgery consulted.  Hospital course complicated by acute urinary retention with difficult Foley catheter placement for which urology was consulted.  Postop she also been experiencing significant confusion/delirium/agitation  Subjective / 24h Interval events: She is cussing out this morning, seems confused, does want to participate and does not answer any questions.  She calls me various names ...  Assessment & Plan: Principal Problem Closed displaced fracture of left femoral neck -Dr. Lorin Mercy with orthopedic surgery consulted, she underwent operative repair on 3/3 -PT evaluation pending, suspect she will need SNF  Active Problems Acute metabolic encephalopathy /in-hospital delirium -Patient with developing confusion, delirium which appeared postop.  She is interfering with medical care, attempting to remove IVs and therefore soft restraints were in place.  We will use Haldol as needed, pain control -Discussed with the niece over the phone, patient does not appear to have previous memory problems or dementia, however the niece tells me that she has exhibited intermittent paranoid ideation in the past accusing her neighbors of cutting of her phone, placing a snake near her door at 1 point.  She also tells me that the patient has been more frail in the last several months.  Essential hypertension -Continue lisinopril  Acute urinary retention -Urology consulted, had to have Foley catheter placed intraoperatively -UA pending, cultures sent   Scheduled  Meds: . aspirin EC  325 mg Oral Q breakfast  . docusate sodium  100 mg Oral BID  . lisinopril  20 mg Oral Daily   Continuous Infusions: . lactated ringers 10 mL/hr at 10/25/19 2209  . lactated ringers 75 mL/hr at 10/26/19 0600   PRN Meds:.haloperidol lactate, HYDROcodone-acetaminophen, menthol-cetylpyridinium **OR** phenol, metoCLOPramide **OR** metoCLOPramide (REGLAN) injection, morphine injection, ondansetron (ZOFRAN) IV, ondansetron **OR** ondansetron (ZOFRAN) IV  DVT prophylaxis: per ortho Code Status: Full code Family Communication: d/w patient and updated her niece ([employer]) Nelson Chimes [phone] Patient admitted from: home Anticipated d/c place: anticipate SNF Barriers to d/c: to undergo operative repair later today   Consultants:  Orthopedic surgery  Urology   Procedures:  None   Microbiology  None   Antimicrobials: None     Objective: Vitals:   10/25/19 2351 10/26/19 0050 10/26/19 0238 10/26/19 0558  BP: (!) 133/58 (!) 143/64 (!) 143/74 (!) 144/63  Pulse: 82 (!) 108 96 94  Resp: 18 18 18 18   Temp:  99.3 F (37.4 C) 98.2 F (36.8 C) 98 F (36.7 C)  TempSrc: Oral Oral Oral Oral  SpO2: 98% 100% 100% 100%  Weight:      Height:        Intake/Output Summary (Last 24 hours) at 10/26/2019 1007 Last data filed at 10/26/2019 0900 Gross per 24 hour  Intake 1637.12 ml  Output 1370 ml  Net 267.12 ml   Filed Weights   10/24/19 2123 10/25/19 1820  Weight: 59.4 kg 59.4 kg    Examination:  Constitutional: angry Eyes: no scleral icterus ENMT: Mucous membranes are moist.  Neck: normal, supple Respiratory: clear to auscultation bilaterally, no wheezing, no crackles. Cardiovascular: Regular rate and  rhythm, no murmurs / rubs / gallops. No LE edema.  Abdomen: non distended, no tenderness. Bowel sounds positive.  Musculoskeletal: no clubbing / cyanosis.  Skin: no rashes Neuro: refusing to follow commands  Data Reviewed: I have independently reviewed following  labs and imaging studies   CBC: Recent Labs  Lab 10/24/19 1800 10/26/19 0348  WBC 5.8 9.5  NEUTROABS 4.5  --   HGB 12.1 10.4*  HCT 37.6 32.3*  MCV 95.7 95.0  PLT 217 [id number]   Basic Metabolic Panel: Recent Labs  Lab 10/24/19 1800 10/26/19 0348  NA 138 136  K 4.1 4.3  CL 100 103  CO2 29 23  GLUCOSE 176* 165*  BUN 18 17  CREATININE 0.84 0.65  CALCIUM 8.9 8.5*   Liver Function Tests: Recent Labs  Lab 10/26/19 0348  AST 25  ALT 21  ALKPHOS 46  BILITOT 0.8  PROT 6.1*  ALBUMIN 3.4*   Coagulation Profile: Recent Labs  Lab 10/24/19 1800  INR 1.0   HbA1C: No results for input(s): HGBA1C in the last 72 hours. CBG: No results for input(s): GLUCAP in the last 168 hours.  Recent Results (from the past 240 hour(s))  Respiratory Panel by RT PCR (Flu A&B, Covid) - Nasopharyngeal Swab     Status: None   Collection Time: 10/24/19  7:11 PM   Specimen: Nasopharyngeal Swab  Result Value Ref Range Status   SARS Coronavirus 2 by RT PCR NEGATIVE NEGATIVE Final    Comment: (NOTE) SARS-CoV-2 target nucleic acids are NOT DETECTED. The SARS-CoV-2 RNA is generally detectable in upper respiratoy specimens during the acute phase of infection. The lowest concentration of SARS-CoV-2 viral copies this assay can detect is 131 copies/mL. A negative result does not preclude SARS-Cov-2 infection and should not be used as the sole basis for treatment or other patient management decisions. A negative result may occur with  improper specimen collection/handling, submission of specimen other than nasopharyngeal swab, presence of viral mutation(s) within the areas targeted by this assay, and inadequate number of viral copies (<131 copies/mL). A negative result must be combined with clinical observations, patient history, and epidemiological information. The expected result is Negative. Fact Sheet for Patients:  PinkCheek.be Fact Sheet for Healthcare Providers:    GravelBags.it This test is not yet ap proved or cleared by the [place] FDA and  has been authorized for detection and/or diagnosis of SARS-CoV-2 by FDA under an Emergency Use Authorization (EUA). This EUA will remain  in effect (meaning this test can be used) for the duration of the COVID-19 declaration under Section 564(b)(1) of the Act, 21 U.S.C. section 360bbb-3(b)(1), unless the authorization is terminated or revoked sooner.    Influenza A by PCR NEGATIVE NEGATIVE Final   Influenza B by PCR NEGATIVE NEGATIVE Final    Comment: (NOTE) The Xpert Xpress SARS-CoV-2/FLU/RSV assay is intended as an aid in  the diagnosis of influenza from Nasopharyngeal swab specimens and  should not be used as a sole basis for treatment. Nasal washings and  aspirates are unacceptable for Xpert Xpress SARS-CoV-2/FLU/RSV  testing. Fact Sheet for Patients: PinkCheek.be Fact Sheet for Healthcare Providers: GravelBags.it This test is not yet approved or cleared by the [place] FDA and  has been authorized for detection and/or diagnosis of SARS-CoV-2 by  FDA under an Emergency Use Authorization (EUA). This EUA will remain  in effect (meaning this test can be used) for the duration of the  Covid-19 declaration under Section 564(b)(1) of the Act, 21  U.S.C. section 360bbb-3(b)(1), unless the authorization is  terminated or revoked. Performed at [facility], [place] [address], [place], [place] [address]   Surgical PCR screen     Status: None   Collection Time: 10/25/19  5:12 AM   Specimen: Nasal Mucosa; Nasal Swab  Result Value Ref Range Status   MRSA, PCR NEGATIVE NEGATIVE Final   Staphylococcus aureus NEGATIVE NEGATIVE Final    Comment: (NOTE) The Xpert SA Assay (FDA approved for NASAL specimens in patients 29 years of age and older), is one component of a comprehensive surveillance  program. It is not intended to diagnose infection nor to guide or monitor treatment. Performed at [facility], [place] [address], [place], [place] [address]      Radiology Studies: DG C-Arm 1-60 Min-No Report  Result Date: 10/25/2019 Fluoroscopy was utilized by the requesting physician.  No radiographic interpretation.   Marzetta Board, MD, PhD [facility]  Between 7 am - 7 pm I am available, please contact me via Amion or Securechat  Between 7 pm - 7 am I am not available, please contact night coverage MD/APP via Amion

## 2019-10-26 NOTE — Progress Notes (Signed)
Phone conversation with POA Olin Hauser.  Again discussed need for soft wrist restraint for patient and measures taken after conversation with On[facility].  Olin Hauser requested stronger IV medication for pain.  Informed her of the desire to avoid strong medications for her age and condition.  Holding the phone for patient to talk increased agitation.  Writer will comply with stated request.

## 2019-10-26 NOTE — TOC Initial Note (Addendum)
Transition of Care [facility]) - Initial/Assessment Note    Patient Details  Name: Holly Salazar MRN: [id number] Date of Birth: 10-Oct-1935  Transition of Care [facility]) CM/SW Contact:    Lennart Pall, LCSW Phone Number: 10/26/2019, 12:14 PM  Clinical Narrative:                 Met briefly with pt this morning, however, she was not willing to discuss her d/c plan/ living situation with me.  Per nursing, pt has been agitated this morning.  Will attempt to speak with her again this afternoon.  Was able to reach pt's POA/ niece, Nelson Chimes who confirms pt's prior living situation and anticipated plan for SNF for rehab from here.  Niece does report that, due to [place], her visits with pt have been very limited this past year.  She notes that pt appears more frail to her now and, thus, decision for SNF prior to pt return home.  Expected Discharge Plan: Skilled Nursing Facility Barriers to Discharge: Continued Medical Work up   Patient Goals and CMS Choice Patient states their goals for this hospitalization and ongoing recovery are:: Pt currently confused and declines any discussion. CMS Medicare.gov Compare Post Acute Care list provided to:: Patient Represenative (must comment) Choice offered to / list presented to : [place] / Guardian  Expected Discharge Plan and Services Expected Discharge Plan: [place] In-house Referral: Clinical Social Work Discharge Planning Services: NA Post Acute Care Choice: [place] Living arrangements for the past 2 months: Apartment                 DME Arranged: N/A DME Agency: NA       HH Arranged: NA [place] Agency: NA        Prior Living Arrangements/Services Living arrangements for the past 2 months: Apartment Lives with:: Self Patient language and need for interpreter reviewed:: Yes Do you feel safe going back to the place where you live?: (plan for SNF)      Need for Family Participation in Patient Care: No (Comment) Care giver  support system in place?: No (comment)(pt lives alone and does not have 24/7 support availabe = SNF plan)   Criminal Activity/Legal Involvement Pertinent to Current Situation/Hospitalization: Yes - Comment as needed  Activities of Daily Living Home Assistive Devices/Equipment: Cane (specify quad or straight) ADL Screening (condition at time of admission) Patient's cognitive ability adequate to safely complete daily activities?: Yes Is the patient deaf or have difficulty hearing?: No Does the patient have difficulty seeing, even when wearing glasses/contacts?: No Does the patient have difficulty concentrating, remembering, or making decisions?: No Patient able to express need for assistance with ADLs?: Yes Does the patient have difficulty dressing or bathing?: No Independently performs ADLs?: Yes (appropriate for developmental age) Does the patient have difficulty walking or climbing stairs?: Yes Weakness of Legs: Both Weakness of Arms/Hands: None  Permission Sought/Granted Permission sought to share information with : Family Supports, Chartered certified accountant granted to share information with : Yes, Verbal Permission Granted  Share Information with NAME: Nelson Chimes @ [phone]  Permission granted to share info w AGENCY: SNF  Permission granted to share info w Relationship: neice/ POA  Permission granted to share info w Contact Information: SNF admissions  Emotional Assessment Appearance:: Appears stated age Attitude/Demeanor/Rapport: Angry, Uncooperative Affect (typically observed): Agitated, Frustrated Orientation: : Oriented to Self, Oriented to Place Alcohol / Substance Use: Not Applicable Psych Involvement: No (comment)  Admission diagnosis:  Closed left  hip fracture, initial encounter ([place]) [S72.002A] Closed displaced fracture of left femoral neck ([place]) [S72.002A] Patient Active Problem List   Diagnosis Date Noted  . Protein-calorie malnutrition,  severe 10/26/2019  . Closed intertrochanteric fracture of hip, left, initial encounter ([place]) 10/25/2019  . HTN (hypertension) 10/24/2019  . Radius and ulna distal fracture 01/16/2016   PCP:  Kathyrn Lass, MD Pharmacy:   [facility] [address] [place] - [address] [place] [address] Phone: [phone] Fax: [phone]     Social Determinants of Health (SDOH) Interventions    Readmission Risk Interventions No flowsheet data found.

## 2019-10-26 NOTE — Progress Notes (Signed)
Patient ID: Holly Salazar, female   DOB: [birth year], 84 y.o.   MRN: [id number]   I attempted to see this patient this morning.  Upon walking in the room she asked me what did I want and and when I told her I was there to look at her hip she decided that she did not want me to.  I asked the nurse what was going on with this patient and she asked me not to wake the patient up because she was just given Haldol.  Per the hospitalist note patient has been acting more confused and displaying inappropriate behavior.  Per nurse patient is in restraints.  Will hold off on examining patient today.  Staff will continue present care.  We will see how she is doing tomorrow.

## 2019-10-26 NOTE — Progress Notes (Signed)
PT Cancellation Note  Patient Details Name: Holly Salazar MRN: [id number] DOB: May 01, 1936   Cancelled Treatment:     PT order received but eval deferred - pt agitated and in restraints.  Will follow.   Anasia Agro 10/26/2019, 12:46 PM

## 2019-10-27 LAB — BASIC METABOLIC PANEL
Anion gap: 7 (ref 5–15)
BUN: 20 mg/dL (ref 8–23)
CO2: 26 mmol/L (ref 22–32)
Calcium: 8.6 mg/dL — ABNORMAL LOW (ref 8.9–10.3)
Chloride: 103 mmol/L (ref 98–111)
Creatinine, Ser: 0.71 mg/dL (ref 0.44–1.00)
GFR calc Af Amer: >60 mL/min (ref >60)
GFR calc non Af Amer: >60 mL/min (ref >60)
Glucose, Bld: 114 mg/dL — ABNORMAL HIGH (ref 70–99)
Potassium: 4 mmol/L (ref 3.5–5.1)
Sodium: 136 mmol/L (ref 135–145)

## 2019-10-27 LAB — CBC
HCT: 30.1 % — ABNORMAL LOW (ref 36.0–46.0)
Hemoglobin: 9.7 g/dL — ABNORMAL LOW (ref 12.0–15.0)
MCH: 30.6 pg (ref 26.0–34.0)
MCHC: 32.2 g/dL (ref 30.0–36.0)
MCV: 95 fL (ref 80.0–100.0)
Platelets: 169 10*3/uL (ref 150–400)
RBC: 3.17 MIL/uL — ABNORMAL LOW (ref 3.87–5.11)
RDW: 13.7 % (ref 11.5–15.5)
WBC: 7.6 10*3/uL (ref 4.0–10.5)
nRBC: 0 % (ref 0.0–0.2)

## 2019-10-27 NOTE — Progress Notes (Signed)
Physical Therapy Treatment Patient Details Name: Holly Salazar MRN: [id number] DOB: Nov 05, 1935 Today's Date: 10/27/2019    History of Present Illness Pt s/p L hip fx with IM nail repair.    PT Comments    Pt continues motivated but requiring significant assist of two for performance of all mobility tasks.  Pt would benefit from follow up rehab at SNF level to maximize IND and safety prior to return home with limited assist.   Follow Up Recommendations  SNF     Equipment Recommendations  None recommended by PT    Recommendations for Other Services       Precautions / Restrictions Precautions Precautions: Fall Restrictions Weight Bearing Restrictions: No LLE Weight Bearing: Weight bearing as tolerated    Mobility  Bed Mobility Overal bed mobility: Needs Assistance Bed Mobility: Sit to Supine     Supine to sit: Mod assist;+2 for physical assistance;+2 for safety/equipment Sit to supine: Mod assist;+2 for physical assistance;+2 for safety/equipment   General bed mobility comments: Increased time with cues for sequence and use of L LE to self assist.  Physical assist to manage trunk and to bring bilat LEs onto bed  Transfers Overall transfer level: Needs assistance Equipment used: Rolling walker (2 wheeled) Transfers: Sit to/from Stand Sit to Stand: Mod assist;+2 physical assistance;+2 safety/equipment         General transfer comment: cues for LE management and use of UEs to self assist.  Physical assist to bring wt up and fwd and to balance in standing  Ambulation/Gait Ambulation/Gait assistance: Min assist;Mod assist;+2 physical assistance;+2 safety/equipment Gait Distance (Feet): 5 Feet Assistive device: Rolling walker (2 wheeled) Gait Pattern/deviations: Step-to pattern;Decreased step length - right;Decreased step length - left;Shuffle;Trunk flexed Gait velocity: decr   General Gait Details: cues fro sequence, posture, position from RW and increased UE WB;  physical assist for support/balance adn to manage RW   Stairs             Wheelchair Mobility    Modified Rankin (Stroke Patients Only)       Balance Overall balance assessment: Needs assistance Sitting-balance support: No upper extremity supported;Feet supported Sitting balance-Leahy Scale: Fair     Standing balance support: Bilateral upper extremity supported Standing balance-Leahy Scale: Poor                              Cognition Arousal/Alertness: Awake/alert Behavior During Therapy: WFL for tasks assessed/performed Overall Cognitive Status: No family/caregiver present to determine baseline cognitive functioning                                        Exercises General Exercises - Lower Extremity Ankle Circles/Pumps: AROM;Both;15 reps;Supine Heel Slides: AAROM;Left;15 reps;Supine Hip ABduction/ADduction: AAROM;Left;15 reps;Supine    General Comments        Pertinent Vitals/Pain Pain Assessment: Faces Faces Pain Scale: Hurts little more Pain Location: L hip Pain Descriptors / Indicators: Sore Pain Intervention(s): Limited activity within patient's tolerance;Monitored during session    Home Living Family/patient expects to be discharged to:: Unsure Living Arrangements: Alone                  Prior Function Level of Independence: Independent;Independent with assistive device(s)      Comments: Pt reports use of SPC out of home and as needed in home   PT Goals (current goals  can now be found in the care plan section) Acute Rehab PT Goals Patient Stated Goal: Regain IND PT Goal Formulation: With patient Time For Goal Achievement: 11/10/19 Potential to Achieve Goals: Good Progress towards PT goals: Progressing toward goals    Frequency    Min 3X/week      PT Plan Current plan remains appropriate    Co-evaluation PT/OT/SLP Co-Evaluation/Treatment: Yes Reason for Co-Treatment: For patient/therapist  safety PT goals addressed during session: Mobility/safety with mobility OT goals addressed during session: ADL's and self-care      AM-PAC PT "6 Clicks" Mobility   Outcome Measure  Help needed turning from your back to your side while in a flat bed without using bedrails?: A Lot Help needed moving from lying on your back to sitting on the side of a flat bed without using bedrails?: A Lot Help needed moving to and from a bed to a chair (including a wheelchair)?: A Lot Help needed standing up from a chair using your arms (e.g., wheelchair or bedside chair)?: A Lot Help needed to walk in hospital room?: A Lot Help needed climbing 3-5 steps with a railing? : Total 6 Click Score: 11    End of Session Equipment Utilized During Treatment: Gait belt Activity Tolerance: Patient tolerated treatment well;Patient limited by fatigue Patient left: with call bell/phone within reach;in bed;with bed alarm set;with nursing/sitter in room Nurse Communication: Mobility status PT Visit Diagnosis: Difficulty in walking, not elsewhere classified (R26.2);Muscle weakness (generalized) (M62.81);Unsteadiness on feet (R26.81)     Time: ID:[id number] PT Time Calculation (min) (ACUTE ONLY): 17 min  Charges:  $Gait Training: 8-22 mins                     [place] Pager [phone] Office [phone]    Suan Pyeatt 10/27/2019, 2:36 PM

## 2019-10-27 NOTE — Plan of Care (Signed)
  Problem: Health Behavior/Discharge Planning: Goal: Ability to manage health-related needs will improve Outcome: Progressing   Problem: Nutrition: Goal: Adequate nutrition will be maintained Outcome: Progressing   

## 2019-10-27 NOTE — Progress Notes (Signed)
PROGRESS NOTE  Holly Salazar [id number] DOB: 06/08/1936 DOA: 10/24/2019 PCP: Kathyrn Lass, MD   LOS: 3 days   Brief Narrative / Interim history: 84 year old female hypertension, osteoporosis, normally walks with a cane at baseline, came into the ED after falling at home.  She landed on the left hip and this was followed by left hip pain and inability to bear weight.  Imaging in the ED showed an impacted left femoral neck fracture.  Orthopedic surgery consulted.  Hospital course complicated by acute urinary retention with difficult Foley catheter placement for which urology was consulted.  Postop she also been experiencing significant confusion/delirium/agitation  Subjective / 24h Interval events: Much improved this morning, alert and oriented x4, has some recollection of yesterday's events.  Denies any pain.  Assessment & Plan: Principal Problem Closed displaced fracture of left femoral neck -Dr. Lorin Mercy with orthopedic surgery consulted, she underwent operative repair on 3/3 -PT evaluation pending, suspect she will need SNF  Active Problems Acute metabolic encephalopathy /in-hospital delirium -This appears to be resolved, appears postop -Discussed with the niece over the phone, patient does not appear to have previous memory problems or dementia, however the niece tells me that she has exhibited intermittent paranoid ideation in the past accusing her neighbors of cutting of her phone, placing a snake near her door at 1 point.  She also tells me that the patient has been more frail in the last several months.  Given resolution of acute in-hospital delirium her reported paranoid ideation can be worked up as an outpatient  Essential hypertension -Continue lisinopril, blood pressure stable  Acute urinary retention -Urology consulted, had to have Foley catheter placed intraoperatively -Urology following, for now continue Foley -Urine cultures without growth  Acute blood loss  anemia -Expected postop, does not need transfusions, no bleeding, will continue to monitor  Scheduled Meds: . aspirin EC  325 mg Oral Q breakfast  . Chlorhexidine Gluconate Cloth  6 each Topical Daily  . docusate sodium  100 mg Oral BID  . lisinopril  20 mg Oral Daily  . QUEtiapine  12.5 mg Oral QHS   Continuous Infusions: . lactated ringers 10 mL/hr at 10/25/19 2209   PRN Meds:.haloperidol lactate, HYDROcodone-acetaminophen, menthol-cetylpyridinium **OR** phenol, metoCLOPramide **OR** metoCLOPramide (REGLAN) injection, morphine injection, ondansetron (ZOFRAN) IV, ondansetron **OR** ondansetron (ZOFRAN) IV  DVT prophylaxis: per ortho Code Status: Full code Family Communication: d/w patient and updated her niece ([place]) Nelson Chimes [phone] over the phone, did not answer today 3/5 and left a message Patient admitted from: home Anticipated d/c place: anticipate SNF Barriers to d/c: Monitoring acute blood loss anemia, awaiting clearance from urology, SNF bed placement pending  Consultants:  Orthopedic surgery  Urology   Procedures:  None   Microbiology  None   Antimicrobials: None     Objective: Vitals:   10/27/19 0116 10/27/19 0243 10/27/19 0552 10/27/19 0957  BP: (!) 104/39 (!) 120/50 (!) 101/42 130/76  Pulse: 76 77 76 100  Resp: 17 20 17 16   Temp: 99.2 F (37.3 C)  99.5 F (37.5 C) 97.9 F (36.6 C)  TempSrc: Oral  Oral Oral  SpO2: 91% 93% 91% 95%  Weight:      Height:        Intake/Output Summary (Last 24 hours) at 10/27/2019 1031 Last data filed at 10/27/2019 0957 Gross per 24 hour  Intake 2079.1 ml  Output 600 ml  Net 1479.1 ml   Filed Weights   10/24/19 2123 10/25/19 1820  Weight: 59.4 kg 59.4 kg  Examination:  Constitutional: No distress, pleasant Eyes: No scleral icterus ENMT: Moist mucous membranes Neck: normal, supple Respiratory: Clear to auscultation bilaterally, no wheezing, no crackles Cardiovascular: Regular rate and rhythm, no  murmurs appreciated.  No peripheral edema Abdomen: Soft, nontender, nondistended, bowel sounds positive Musculoskeletal: no clubbing / cyanosis.  Skin: No rashes seen Neuro: Equal strength, no focal deficits  Data Reviewed: I have independently reviewed following labs and imaging studies   CBC: Recent Labs  Lab 10/24/19 1800 10/26/19 0348 10/27/19 0521  WBC 5.8 9.5 7.6  NEUTROABS 4.5  --   --   HGB 12.1 10.4* 9.7*  HCT 37.6 32.3* 30.1*  MCV 95.7 95.0 95.0  PLT 217 164 [id number]   Basic Metabolic Panel: Recent Labs  Lab 10/24/19 1800 10/26/19 0348 10/27/19 0521  NA 138 136 136  K 4.1 4.3 4.0  CL 100 103 103  CO2 29 23 26   GLUCOSE 176* 165* 114*  BUN 18 17 20   CREATININE 0.84 0.65 0.71  CALCIUM 8.9 8.5* 8.6*   Liver Function Tests: Recent Labs  Lab 10/26/19 0348  AST 25  ALT 21  ALKPHOS 46  BILITOT 0.8  PROT 6.1*  ALBUMIN 3.4*   Coagulation Profile: Recent Labs  Lab 10/24/19 1800  INR 1.0   HbA1C: No results for input(s): HGBA1C in the last 72 hours. CBG: No results for input(s): GLUCAP in the last 168 hours.  Recent Results (from the past 240 hour(s))  Respiratory Panel by RT PCR (Flu A&B, Covid) - Nasopharyngeal Swab     Status: None   Collection Time: 10/24/19  7:11 PM   Specimen: Nasopharyngeal Swab  Result Value Ref Range Status   SARS Coronavirus 2 by RT PCR NEGATIVE NEGATIVE Final    Comment: (NOTE) SARS-CoV-2 target nucleic acids are NOT DETECTED. The SARS-CoV-2 RNA is generally detectable in upper respiratoy specimens during the acute phase of infection. The lowest concentration of SARS-CoV-2 viral copies this assay can detect is 131 copies/mL. A negative result does not preclude SARS-Cov-2 infection and should not be used as the sole basis for treatment or other patient management decisions. A negative result may occur with  improper specimen collection/handling, submission of specimen other than nasopharyngeal swab, presence of viral  mutation(s) within the areas targeted by this assay, and inadequate number of viral copies (<131 copies/mL). A negative result must be combined with clinical observations, patient history, and epidemiological information. The expected result is Negative. Fact Sheet for Patients:  PinkCheek.be Fact Sheet for Healthcare Providers:  GravelBags.it This test is not yet ap proved or cleared by the [place] FDA and  has been authorized for detection and/or diagnosis of SARS-CoV-2 by FDA under an Emergency Use Authorization (EUA). This EUA will remain  in effect (meaning this test can be used) for the duration of the COVID-19 declaration under Section 564(b)(1) of the Act, 21 U.S.C. section 360bbb-3(b)(1), unless the authorization is terminated or revoked sooner.    Influenza A by PCR NEGATIVE NEGATIVE Final   Influenza B by PCR NEGATIVE NEGATIVE Final    Comment: (NOTE) The Xpert Xpress SARS-CoV-2/FLU/RSV assay is intended as an aid in  the diagnosis of influenza from Nasopharyngeal swab specimens and  should not be used as a sole basis for treatment. Nasal washings and  aspirates are unacceptable for Xpert Xpress SARS-CoV-2/FLU/RSV  testing. Fact Sheet for Patients: PinkCheek.be Fact Sheet for Healthcare Providers: GravelBags.it This test is not yet approved or cleared by the [place] FDA and  has  been authorized for detection and/or diagnosis of SARS-CoV-2 by  FDA under an Emergency Use Authorization (EUA). This EUA will remain  in effect (meaning this test can be used) for the duration of the  Covid-19 declaration under Section 564(b)(1) of the Act, 21  U.S.C. section 360bbb-3(b)(1), unless the authorization is  terminated or revoked. Performed at [facility], [place] [address] [place], [place] [address]   Surgical PCR screen     Status: None    Collection Time: 10/25/19  5:12 AM   Specimen: Nasal Mucosa; Nasal Swab  Result Value Ref Range Status   MRSA, PCR NEGATIVE NEGATIVE Final   Staphylococcus aureus NEGATIVE NEGATIVE Final    Comment: (NOTE) The Xpert SA Assay (FDA approved for NASAL specimens in patients 82 years of age and older), is one component of a comprehensive surveillance program. It is not intended to diagnose infection nor to guide or monitor treatment. Performed at [facility], [place] [address] [place], [place] [address]   Urine Culture     Status: None   Collection Time: 10/25/19  9:40 PM   Specimen: Urine, Catheterized  Result Value Ref Range Status   Specimen Description   Final    URINE, CATHETERIZED Performed at [place] [address], [place], [place] [address]    Special Requests   Final    Immunocompromised Performed at [facility], [place] [address] [place], [place] [address]    Culture   Final    NO GROWTH Performed at [facility], [place] [address], [place], [place] [address]    Report Status 10/26/2019 FINAL  Final     Radiology Studies: No results found. Marzetta Board, MD, PhD [facility]  Between 7 am - 7 pm I am available, please contact me via Amion or Securechat  Between 7 pm - 7 am I am not available, please contact night coverage MD/APP via Amion

## 2019-10-27 NOTE — Evaluation (Signed)
Physical Therapy Evaluation Patient Details Name: Holly Salazar MRN: [id number] DOB: 01-08-1936 Today's Date: 10/27/2019   History of Present Illness  Pt s/p L hip fx with IM nail repair.  Clinical Impression  Pt admitted as above and presenting with functional mobility limitations 2* generalized weakness, decreased L LE strength/ROM, post op pain and balance deficits.  Pt would benefit from follow up rehab at SNF level to maximize IND and safety prior to return home with ltd assist.    Follow Up Recommendations SNF    Equipment Recommendations  None recommended by PT    Recommendations for Other Services       Precautions / Restrictions Precautions Precautions: Fall Restrictions Weight Bearing Restrictions: No LLE Weight Bearing: Weight bearing as tolerated      Mobility  Bed Mobility Overal bed mobility: Needs Assistance Bed Mobility: Supine to Sit     Supine to sit: Mod assist;+2 for physical assistance;+2 for safety/equipment     General bed mobility comments: Increased time with cues for sequence and use of L LE to self assist  Transfers Overall transfer level: Needs assistance Equipment used: Rolling walker (2 wheeled) Transfers: Sit to/from Stand Sit to Stand: Mod assist;+2 physical assistance;+2 safety/equipment;From elevated surface         General transfer comment: cues for LE management and use of UEs to self assist.  Physical assist to bring wt up and fwd and to balance in standing  Ambulation/Gait Ambulation/Gait assistance: Min assist;Mod assist;+2 physical assistance;+2 safety/equipment Gait Distance (Feet): 4 Feet Assistive device: Rolling walker (2 wheeled) Gait Pattern/deviations: Step-to pattern;Decreased step length - right;Decreased step length - left;Shuffle;Trunk flexed Gait velocity: decr   General Gait Details: cues fro sequence, posture, position from RW and increased UE WB; physical assist for support/balance adn to manage  RW  Stairs            Wheelchair Mobility    Modified Rankin (Stroke Patients Only)       Balance Overall balance assessment: Needs assistance Sitting-balance support: No upper extremity supported;Feet supported Sitting balance-Leahy Scale: Fair     Standing balance support: Bilateral upper extremity supported Standing balance-Leahy Scale: Poor                               Pertinent Vitals/Pain Pain Assessment: Faces Faces Pain Scale: Hurts little more Pain Location: L hip Pain Descriptors / Indicators: Sore Pain Intervention(s): Limited activity within patient's tolerance;Monitored during session    Home Living Family/patient expects to be discharged to:: Unsure Living Arrangements: Alone                    Prior Function Level of Independence: Independent;Independent with assistive device(s)         Comments: Pt reports use of SPC out of home and as needed in home     Hand Dominance   Dominant Hand: Right    Extremity/Trunk Assessment   Upper Extremity Assessment Upper Extremity Assessment: Defer to OT evaluation    Lower Extremity Assessment Lower Extremity Assessment: Generalized weakness;LLE deficits/detail LLE Deficits / Details: Strength at hip ~ 2/5 with AAROM at hip to 70 flex and 15 abd    Cervical / Trunk Assessment Cervical / Trunk Assessment: Normal  Communication   Communication: No difficulties  Cognition Arousal/Alertness: Awake/alert Behavior During Therapy: WFL for tasks assessed/performed Overall Cognitive Status: No family/caregiver present to determine baseline cognitive functioning  General Comments      Exercises General Exercises - Lower Extremity Ankle Circles/Pumps: AROM;Both;15 reps;Supine Heel Slides: AAROM;Left;15 reps;Supine Hip ABduction/ADduction: AAROM;Left;15 reps;Supine   Assessment/Plan    PT Assessment Patient needs  continued PT services  PT Problem List Decreased strength;Decreased range of motion;Decreased activity tolerance;Decreased balance;Decreased mobility;Decreased knowledge of use of DME;Pain;Decreased knowledge of precautions       PT Treatment Interventions DME instruction;Gait training;Functional mobility training;Therapeutic activities;Therapeutic exercise;Balance training;Cognitive remediation;Patient/family education    PT Goals (Current goals can be found in the Care Plan section)  Acute Rehab PT Goals Patient Stated Goal: Regain IND PT Goal Formulation: With patient Time For Goal Achievement: 11/10/19 Potential to Achieve Goals: Good    Frequency Min 3X/week   Barriers to discharge Decreased caregiver support Pt lives alone    Co-evaluation PT/OT/SLP Co-Evaluation/Treatment: Yes Reason for Co-Treatment: For patient/therapist safety PT goals addressed during session: Mobility/safety with mobility OT goals addressed during session: ADL's and self-care       AM-PAC PT "6 Clicks" Mobility  Outcome Measure Help needed turning from your back to your side while in a flat bed without using bedrails?: A Lot Help needed moving from lying on your back to sitting on the side of a flat bed without using bedrails?: A Lot Help needed moving to and from a bed to a chair (including a wheelchair)?: A Lot Help needed standing up from a chair using your arms (e.g., wheelchair or bedside chair)?: A Lot Help needed to walk in hospital room?: A Lot Help needed climbing 3-5 steps with a railing? : Total 6 Click Score: 11    End of Session Equipment Utilized During Treatment: Gait belt Activity Tolerance: Patient tolerated treatment well;Patient limited by fatigue Patient left: in chair;with call bell/phone within reach;with chair alarm set Nurse Communication: Mobility status PT Visit Diagnosis: Difficulty in walking, not elsewhere classified (R26.2);Muscle weakness (generalized)  (M62.81);Unsteadiness on feet (R26.81)    Time: [id number] PT Time Calculation (min) (ACUTE ONLY): 26 min   Charges:   PT Evaluation $PT Eval Low Complexity: 1 Low          [place] Pager [phone] Office [phone]   Huriel Matt 10/27/2019, 12:05 PM

## 2019-10-27 NOTE — Care Management Important Message (Signed)
Important Message  Patient Details IM Letter given to [place] Case Manager to present to the Patient Name: Holly Salazar MRN: [id number] Date of Birth: 10-Apr-1936   Medicare Important Message Given:  Yes     Kerin Salen 10/27/2019, 12:17 PM

## 2019-10-27 NOTE — TOC Progression Note (Signed)
Transition of Care [facility]) - Progression Note    Patient Details  Name: Lexxie Stoecker MRN: [id number] Date of Birth: 1935-10-04  Transition of [facility]) CM/SW Contact  Lennart Pall, LCSW Phone Number: 10/27/2019, 3:37 PM  Clinical Narrative:     Have received several SNF bed offers for pt today.  Pt and niece, Nelson Chimes, have accepted [facility] who can admit pt over the weekend if medically cleared for d/c.  I have alerted Dr. Cruzita Lederer who will place order for COVID test per facility request.  [facility] contact for the weekend is Kirsten @ [phone].    Expected Discharge Plan: [place] Barriers to Discharge: Continued Medical Work up  Expected Discharge Plan and Services Expected Discharge Plan: [place] In-house Referral: Clinical Social Work Discharge Planning Services: NA Post Acute Care Choice: [place] Living arrangements for the past 2 months: Apartment                 DME Arranged: N/A DME Agency: NA       HH Arranged: NA HH Agency: NA         Social Determinants of Health (SDOH) Interventions    Readmission Risk Interventions No flowsheet data found.

## 2019-10-27 NOTE — Progress Notes (Signed)
Occupational Therapy Evaluation Patient Details Name: Lequisha Donchez MRN: [id number] DOB: 1935-11-10 Today's Date: 10/27/2019    History of Present Illness Pt s/p L hip fx with IM nail repair.   Clinical Impression   Patient presents with cognitive deficits with decreased short term memory, decreased problem solving skills, and decreased awareness of physical deficits. Patient reports living home alone, with no one to physically assist 24 hours a day upon d/c. Overall, patient is a high fall risk requiring set-up to total assist with self-care tasks and Moderate A x 2 for functional mobility with RW. Patient will benefit from continued skilled acute OT services with recommendation for d/c to SNF.     Follow Up Recommendations  SNF;Supervision/Assistance - 24 hour    Equipment Recommendations  3 in 1 bedside commode    Recommendations for Other Services       Precautions / Restrictions Precautions Precautions: Fall Restrictions Weight Bearing Restrictions: No LLE Weight Bearing: Weight bearing as tolerated      Mobility Bed Mobility Overal bed mobility: Needs Assistance Bed Mobility: Sit to Supine     Supine to sit: Mod assist;+2 for physical assistance;+2 for safety/equipment Sit to supine: Mod assist;+2 for physical assistance;+2 for safety/equipment   General bed mobility comments: Increased time with cues for sequence and use of L LE to self assist.  Physical assist to manage trunk and to bring bilat LEs onto bed  Transfers Overall transfer level: Needs assistance Equipment used: Rolling walker (2 wheeled) Transfers: Sit to/from Stand Sit to Stand: Mod assist;+2 physical assistance;+2 safety/equipment         General transfer comment: cues for LE management and use of UEs to self assist.  Physical assist to bring wt up and fwd and to balance in standing    Balance Overall balance assessment: Needs assistance Sitting-balance support: No upper extremity  supported;Feet supported Sitting balance-Leahy Scale: Fair     Standing balance support: Bilateral upper extremity supported Standing balance-Leahy Scale: Poor                             ADL either performed or assessed with clinical judgement   ADL Overall ADL's : Needs assistance/impaired Eating/Feeding: Set up   Grooming: Oral care;Wash/dry face;Wash/dry hands;Min guard   Upper Body Bathing: Set up   Lower Body Bathing: Maximal assistance   Upper Body Dressing : Minimal assistance   Lower Body Dressing: Total assistance   Toilet Transfer: Moderate assistance;+2 for physical assistance;+2 for safety/equipment;RW   Toileting- Clothing Manipulation and Hygiene: Maximal assistance   Tub/ Shower Transfer: Moderate assistance;+2 for physical assistance;+2 for safety/equipment;Rolling walker   Functional mobility during ADLs: Moderate assistance;+2 for physical assistance;+2 for safety/equipment;Rolling walker       Vision Baseline Vision/History: Wears glasses Wears Glasses: At all times       Perception     Praxis      Pertinent Vitals/Pain Pain Assessment: Faces Pain Score: 4  Faces Pain Scale: Hurts little more Pain Location: L hip Pain Descriptors / Indicators: Sore Pain Intervention(s): Limited activity within patient's tolerance     Hand Dominance Right   Extremity/Trunk Assessment Upper Extremity Assessment Upper Extremity Assessment: Generalized weakness;RUE deficits/detail;LUE deficits/detail RUE: (Grossly 3+/5, AROM WFL) LUE: (Grossly 3+/5, AROM WFL)   Lower Extremity Assessment Lower Extremity Assessment: Defer to PT evaluation       Communication Communication Communication: No difficulties   Cognition Arousal/Alertness: Awake/alert Behavior During Therapy: WFL for tasks  assessed/performed Overall Cognitive Status: No family/caregiver present to determine baseline cognitive functioning                                      General Comments       Exercises     Shoulder Instructions      Home Living Family/patient expects to be discharged to:: Unsure Living Arrangements: Alone                               Additional Comments: Lives in a aparment, walk-in shower,  no ADL AE      Prior Functioning/Environment Level of Independence: Independent;Independent with assistive device(s)        Comments: Pt reports use of SPC out of home and as needed in home        OT Problem List: Decreased strength;Decreased activity tolerance;Impaired balance (sitting and/or standing);Decreased safety awareness;Decreased knowledge of use of DME or AE;Decreased cognition      OT Treatment/Interventions:      OT Goals(Current goals can be found in the care plan section) Acute Rehab OT Goals Patient Stated Goal: Regain IND OT Goal Formulation: With patient Time For Goal Achievement: 11/10/19 Potential to Achieve Goals: Good  OT Frequency: Min 2X/week   Barriers to D/C:            Co-evaluation PT/OT/SLP Co-Evaluation/Treatment: Yes Reason for Co-Treatment: Necessary to address cognition/behavior during functional activity;For patient/therapist safety;To address functional/ADL transfers PT goals addressed during session: Mobility/safety with mobility;Balance;Proper use of DME OT goals addressed during session: ADL's and self-care      AM-PAC OT "6 Clicks" Daily Activity     Outcome Measure Help from another person eating meals?: A Little Help from another person taking care of personal grooming?: A Little Help from another person toileting, which includes using toliet, bedpan, or urinal?: A Lot Help from another person bathing (including washing, rinsing, drying)?: A Lot Help from another person to put on and taking off regular upper body clothing?: A Little Help from another person to put on and taking off regular lower body clothing?: Total 6 Click Score: 14   End of Session  Equipment Utilized During Treatment: Gait belt;Rolling walker Nurse Communication: Mobility status  Activity Tolerance: Patient limited by pain Patient left: in chair;with call bell/phone within reach;with chair alarm set  OT Visit Diagnosis: Unsteadiness on feet (R26.81);Muscle weakness (generalized) (M62.81);History of falling (Z91.81)                Time: [id number] OT Time Calculation (min): 24 min Charges:  OT General Charges $OT Visit: 1 Visit OT Evaluation $OT Eval Moderate Complexity: 1 Mod OT Treatments $Self Care/Home Management : 8-22 mins  Chrishawn Boley OTR/L   Akela Pocius 10/27/2019, 4:18 PM

## 2019-10-27 NOTE — NC FL2 (Signed)
[place] LEVEL OF CARE SCREENING TOOL     IDENTIFICATION  Patient Name: Holly Salazar Birthdate: [birth year] Sex: female Admission Date (Current Location): 10/24/2019  [facility] and [place] Number:  Herbalist and Address:  [facility],  [place] [address], [place]      Provider Number: [id number]  Attending Physician Name and Address:  Caren Griffins, MD  Relative Name and Phone Number:  Nelson Chimes (neice/ POA) @ [phone]    Current Level of Care: Hospital Recommended Level of Care: Accident Prior Approval Number:    Date Approved/Denied:   PASRR Number: [id number]  Discharge Plan: SNF    Current Diagnoses: Patient Active Problem List   Diagnosis Date Noted  . Protein-calorie malnutrition, severe 10/26/2019  . Closed intertrochanteric fracture of hip, left, initial encounter ([facility]) 10/25/2019  . HTN (hypertension) 10/24/2019  . Radius and ulna distal fracture 01/16/2016    Orientation RESPIRATION BLADDER Height & Weight     Self, Time, Situation, Place  Normal Indwelling catheter Weight: 130 lb 15.3 oz (59.4 kg) Height:  5\' 7"  (170.2 cm)  BEHAVIORAL SYMPTOMS/MOOD NEUROLOGICAL BOWEL NUTRITION STATUS      Continent Diet(Dys 3, thin)  AMBULATORY STATUS COMMUNICATION OF NEEDS Skin   Extensive Assist Verbally Surgical wounds                       Personal Care Assistance Level of Assistance  Bathing, Dressing Bathing Assistance: Maximum assistance   Dressing Assistance: Maximum assistance     Functional Limitations Info             SPECIAL CARE FACTORS FREQUENCY  PT (By licensed PT), OT (By licensed OT)     PT Frequency: 5x/wk OT Frequency: 5x/wk            Contractures Contractures Info: Not present    Additional Factors Info  Code Status, Allergies Code Status Info: Full Allergies Info: NKDA           Current Medications (10/27/2019):  This is the current hospital  active medication list Current Facility-Administered Medications  Medication Dose Route Frequency Provider Last Rate Last Admin  . aspirin EC tablet 325 mg  325 mg Oral Q breakfast [name], MD      . Chlorhexidine Gluconate Cloth 2 % PADS 6 each  6 each Topical Daily Gherghe, Costin M, MD      . docusate sodium (COLACE) capsule 100 mg  100 mg Oral BID [name], MD   100 mg at 10/26/19 2155  . haloperidol lactate (HALDOL) injection 2 mg  2 mg Intravenous Q6H PRN Caren Griffins, MD   2 mg at 10/26/19 0830  . HYDROcodone-acetaminophen (NORCO/VICODIN) 5-325 MG per tablet 1-2 tablet  1-2 tablet Oral Q6H PRN Etta Quill, DO      . lactated ringers infusion   Intravenous Continuous Barnet Glasgow, MD 10 mL/hr at 10/25/19 2209 Rate Verify at 10/25/19 2209  . lisinopril (ZESTRIL) tablet 20 mg  20 mg Oral Daily Jennette Kettle M, DO   20 mg at 10/25/19 1128  . menthol-cetylpyridinium (CEPACOL) lozenge 3 mg  1 lozenge Oral PRN [name], MD       Or  . phenol (CHLORASEPTIC) mouth spray 1 spray  1 spray Mouth/Throat PRN [name], MD      . metoCLOPramide (REGLAN) tablet 5-10 mg  5-10 mg Oral Q8H PRN [name], MD  Or  . metoCLOPramide (REGLAN) injection 5-10 mg  5-10 mg Intravenous Q8H PRN [name], MD      . morphine 2 MG/ML injection 0.5 mg  0.5 mg Intravenous Q2H PRN Etta Quill, DO   0.5 mg at 10/26/19 0144  . ondansetron (ZOFRAN) injection 4 mg  4 mg Intravenous Q6H PRN Etta Quill, DO   4 mg at 10/24/19 2022  . ondansetron (ZOFRAN) tablet 4 mg  4 mg Oral Q6H PRN [name], MD       Or  . ondansetron [facility]) injection 4 mg  4 mg Intravenous Q6H PRN [name], MD      . QUEtiapine (SEROQUEL) tablet 12.5 mg  12.5 mg Oral QHS Caren Griffins, MD   12.5 mg at 10/26/19 2155     Discharge Medications: Please see discharge summary for a list of discharge medications.  Relevant Imaging Results:  Relevant Lab Results:   Additional  Information SS# [id number]  Lennart Pall, LCSW

## 2019-10-27 NOTE — Progress Notes (Signed)
Patient ID: Holly Salazar, female   DOB: 27-Sep-1935, 84 y.o.   MRN: [id number]  2 Days Post-Op Subjective: No new complaints.  Objective: Vital signs in last 24 hours: Temp:  [97.5 F (36.4 C)-99.5 F (37.5 C)] 97.5 F (36.4 C) (03/05 1410) Pulse Rate:  [76-100] 85 (03/05 1410) Resp:  [16-20] 16 (03/05 1410) BP: (101-130)/(39-76) 117/44 (03/05 1410) SpO2:  [91 %-97 %] 97 % (03/05 1410)  Intake/Output from previous day: 03/04 0701 - 03/05 0700 In: 1839.1 [P.O.:360; I.V.:1479.1] Out: 600 [Urine:600] Intake/Output this shift: Total I/O In: 480 [P.O.:480] Out: 1250 [Urine:1250]  Physical Exam:  GU: Urine clear  Lab Results: Recent Labs    10/26/19 0348 10/27/19 0521  HGB 10.4* 9.7*  HCT 32.3* 30.1*   BMET Recent Labs    10/26/19 0348 10/27/19 0521  NA 136 136  K 4.3 4.0  CL 103 103  CO2 23 26  GLUCOSE 165* 114*  BUN 17 20  CREATININE 0.65 0.71  CALCIUM 8.5* 8.6*     Studies/Results:  Assessment/Plan: Urinary retention: Unclear etiology.  Ok to have voiding trial tomorrow if orthopaedics ok with patient being in frog leg position if catheter needs to be replaced.  If not, would leave catheter until she is able to be in that position as catheter placement when her left leg was immobilized was not able to be performed.   LOS: 3 days   Dutch Gray 10/27/2019, 6:41 PM

## 2019-10-27 NOTE — TOC Progression Note (Signed)
Transition of Care [facility]) - Progression Note    Patient Details  Name: Holly Salazar MRN: [id number] Date of Birth: [birth year]  Transition of Care [facility]) CM/SW Contact  Lennart Pall, LCSW Phone Number: 10/27/2019, 9:59 AM  Clinical Narrative:    Met with patient this morning and she is much clearer and calm.  We discussed plan for SNF and she is in agreement with this. FL2 has been sent out to area facilities.  Await offers.    Expected Discharge Plan: [place] Barriers to Discharge: Continued Medical Work up  Expected Discharge Plan and Services Expected Discharge Plan: [place] In-house Referral: Clinical Social Work Discharge Planning Services: NA Post Acute Care Choice: [place] Living arrangements for the past 2 months: Apartment                 DME Arranged: N/A DME Agency: NA       HH Arranged: NA HH Agency: NA         Social Determinants of Health (SDOH) Interventions    Readmission Risk Interventions No flowsheet data found.

## 2019-10-28 LAB — CBC
HCT: 26.5 % — ABNORMAL LOW (ref 36.0–46.0)
Hemoglobin: 8.4 g/dL — ABNORMAL LOW (ref 12.0–15.0)
MCH: 30.1 pg (ref 26.0–34.0)
MCHC: 31.7 g/dL (ref 30.0–36.0)
MCV: 95 fL (ref 80.0–100.0)
Platelets: 168 10*3/uL (ref 150–400)
RBC: 2.79 MIL/uL — ABNORMAL LOW (ref 3.87–5.11)
RDW: 13.7 % (ref 11.5–15.5)
WBC: 7.3 10*3/uL (ref 4.0–10.5)
nRBC: 0 % (ref 0.0–0.2)

## 2019-10-28 LAB — BASIC METABOLIC PANEL
Anion gap: 6 (ref 5–15)
BUN: 26 mg/dL — ABNORMAL HIGH (ref 8–23)
CO2: 26 mmol/L (ref 22–32)
Calcium: 8.2 mg/dL — ABNORMAL LOW (ref 8.9–10.3)
Chloride: 102 mmol/L (ref 98–111)
Creatinine, Ser: 0.65 mg/dL (ref 0.44–1.00)
GFR calc Af Amer: >60 mL/min (ref >60)
GFR calc non Af Amer: >60 mL/min (ref >60)
Glucose, Bld: 138 mg/dL — ABNORMAL HIGH (ref 70–99)
Potassium: 4.1 mmol/L (ref 3.5–5.1)
Sodium: 134 mmol/L — ABNORMAL LOW (ref 135–145)

## 2019-10-28 LAB — SARS CORONAVIRUS 2 (TAT 6-24 HRS): SARS Coronavirus 2: NEGATIVE

## 2019-10-28 MED ORDER — SENNA 8.6 MG PO TABS
2.0000 | ORAL_TABLET | Freq: Every day | ORAL | Status: DC
  Administered 2019-10-28 – 2019-10-30 (×3): 17.2 mg via ORAL
  Filled 2019-10-28 (×3): qty 2

## 2019-10-28 NOTE — Progress Notes (Signed)
Patient ID: Holly Salazar, female   DOB: 11-Oct-1935, 84 y.o.   MRN: [id number] I changed her left hip dressing at the bedside.  Her incisions look good and are clean/dry.  She is able to move her ankle and foot on the left.  She does have urinary retention, so if her hips need to be flexed and abducted to get a foley cath in, that will be fine given her hip fixation.

## 2019-10-28 NOTE — Progress Notes (Signed)
Physical Therapy Treatment Patient Details Name: Holly Salazar MRN: [id number] DOB: 03-Sep-1935 Today's Date: 10/28/2019    History of Present Illness Pt s/p L hip fx with IM nail repair.    PT Comments    Pt is very cooperative and progressing slowly but steadily with mobility but continues to require significant assist for all mobility tasks.   Follow Up Recommendations  SNF     Equipment Recommendations  None recommended by PT    Recommendations for Other Services       Precautions / Restrictions Precautions Precautions: Fall Restrictions Weight Bearing Restrictions: No LLE Weight Bearing: Weight bearing as tolerated    Mobility  Bed Mobility Overal bed mobility: Needs Assistance Bed Mobility: Supine to Sit     Supine to sit: Mod assist;+2 for physical assistance;+2 for safety/equipment     General bed mobility comments: Increased time with cues for sequence and use of L LE to self assist.  Physical assist to manage trunk and bil LEs  Transfers Overall transfer level: Needs assistance Equipment used: Rolling walker (2 wheeled) Transfers: Sit to/from Stand Sit to Stand: Mod assist;+2 physical assistance;+2 safety/equipment         General transfer comment: cues for LE management and use of UEs to self assist.  Physical assist to bring wt up and fwd and to balance in standing  Ambulation/Gait Ambulation/Gait assistance: Min assist;Mod assist;+2 physical assistance;+2 safety/equipment Gait Distance (Feet): 24 Feet Assistive device: Rolling walker (2 wheeled) Gait Pattern/deviations: Step-to pattern;Decreased step length - right;Decreased step length - left;Shuffle;Trunk flexed Gait velocity: decr   General Gait Details: cues fro sequence, posture, position from RW and increased UE WB; physical assist for support/balance adn to manage RW   Stairs             Wheelchair Mobility    Modified Rankin (Stroke Patients Only)       Balance Overall  balance assessment: Needs assistance Sitting-balance support: No upper extremity supported;Feet supported Sitting balance-Leahy Scale: Fair     Standing balance support: Bilateral upper extremity supported Standing balance-Leahy Scale: Poor                              Cognition Arousal/Alertness: Awake/alert Behavior During Therapy: WFL for tasks assessed/performed Overall Cognitive Status: No family/caregiver present to determine baseline cognitive functioning                                        Exercises      General Comments        Pertinent Vitals/Pain Pain Assessment: Faces Faces Pain Scale: Hurts little more Pain Location: L hip Pain Descriptors / Indicators: Sore Pain Intervention(s): Limited activity within patient's tolerance;Monitored during session    Home Living                      Prior Function            PT Goals (current goals can now be found in the care plan section) Acute Rehab PT Goals Patient Stated Goal: Regain IND PT Goal Formulation: With patient Time For Goal Achievement: 11/10/19 Potential to Achieve Goals: Good Progress towards PT goals: Progressing toward goals    Frequency    Min 3X/week      PT Plan Current plan remains appropriate    Co-evaluation  AM-PAC PT "6 Clicks" Mobility   Outcome Measure  Help needed turning from your back to your side while in a flat bed without using bedrails?: A Lot Help needed moving from lying on your back to sitting on the side of a flat bed without using bedrails?: A Lot Help needed moving to and from a bed to a chair (including a wheelchair)?: A Lot Help needed standing up from a chair using your arms (e.g., wheelchair or bedside chair)?: A Lot Help needed to walk in hospital room?: A Lot Help needed climbing 3-5 steps with a railing? : Total 6 Click Score: 11    End of Session Equipment Utilized During Treatment: Gait  belt Activity Tolerance: Patient tolerated treatment well;Patient limited by fatigue Patient left: in chair;with call bell/phone within reach;with chair alarm set Nurse Communication: Mobility status PT Visit Diagnosis: Difficulty in walking, not elsewhere classified (R26.2);Muscle weakness (generalized) (M62.81);Unsteadiness on feet (R26.81)     Time: 1205-1223 PT Time Calculation (min) (ACUTE ONLY): 18 min  Charges:  $Gait Training: 8-22 mins                     [place] Pager [phone] Office [phone]    [facility] 10/28/2019, 12:33 PM

## 2019-10-28 NOTE — Progress Notes (Signed)
PROGRESS NOTE  Holly Salazar [id number] DOB: 04/03/1936 DOA: 10/24/2019 PCP: Kathyrn Lass, MD   LOS: 4 days   Brief Narrative / Interim history: 84 year old female hypertension, osteoporosis, normally walks with a cane at baseline, came into the ED after falling at home.  She landed on the left hip and this was followed by left hip pain and inability to bear weight.  Imaging in the ED showed an impacted left femoral neck fracture.  Orthopedic surgery consulted.  Hospital course complicated by acute urinary retention with difficult Foley catheter placement for which urology was consulted.  Postop she also been experiencing significant confusion/delirium/agitation  Subjective / 24h Interval events: No specific complaints, denies any pain.  No chest pain, no shortness of breath, no palpitations.  Assessment & Plan: Principal Problem Closed displaced fracture of left femoral neck -Dr. Lorin Mercy with orthopedic surgery consulted, she underwent operative repair on 3/3 -PT recommended SNF, social worker consulted.  Repeat Covid done yesterday  Active Problems Acute metabolic encephalopathy /in-hospital delirium -This appears to be resolved, appears postop -Discussed with the niece over the phone, patient does not appear to have previous memory problems or dementia, however the niece tells me that she has exhibited intermittent paranoid ideation in the past accusing her neighbors of cutting of her phone, placing a snake near her door at 1 point.  She also tells me that the patient has been more frail in the last several months.  Given resolution of acute in-hospital delirium her reported paranoid ideation can be worked up as an outpatient  Fever 3/5-3/6 overnight -Patient had recorded fever of 100.8, completely asymptomatic.  This is likely postop.  If afebrile over the next 24 hours okay for SNF on Sunday 3/7  Essential hypertension -Continue lisinopril, blood pressure stable  Acute urinary  retention -Urology consulted, had to have Foley catheter placed intraoperatively -Discussed with Dr. Alinda Money last night, he recommends to give a voiding trial while here in the hospital.  Discussed with Dr. Ninfa Linden, okay for hip maneuvering a Foley needs to be reinserted if she fails voiding trial.  Remove Foley today.  Acute blood loss anemia -Expected postop, does not need transfusion  Scheduled Meds: . aspirin EC  325 mg Oral Q breakfast  . Chlorhexidine Gluconate Cloth  6 each Topical Daily  . docusate sodium  100 mg Oral BID  . lisinopril  20 mg Oral Daily  . QUEtiapine  12.5 mg Oral QHS   Continuous Infusions: . lactated ringers 10 mL/hr at 10/25/19 2209   PRN Meds:.haloperidol lactate, HYDROcodone-acetaminophen, menthol-cetylpyridinium **OR** phenol, metoCLOPramide **OR** metoCLOPramide (REGLAN) injection, morphine injection, ondansetron (ZOFRAN) IV, ondansetron **OR** ondansetron (ZOFRAN) IV  DVT prophylaxis: per ortho Code Status: Full code Family Communication: d/w patient and updated her niece ([place]) Nelson Chimes [phone] over the phone, did not answer today 3/5 and left a message Patient admitted from: home Anticipated d/c place: anticipate SNF within 24 hours Barriers to d/c: Monitoring acute blood loss anemia, voiding trial, febrile overnight and monitoring for resolution of fever  Consultants:  Orthopedic surgery  Urology   Procedures:  None   Microbiology  None   Antimicrobials: None     Objective: Vitals:   10/27/19 1410 10/27/19 2202 10/28/19 0511 10/28/19 0806  BP: (!) 117/44 (!) 119/45 (!) 111/42 (!) 123/53  Pulse: 85 95 73 86  Resp: 16 18 18 16   Temp: (!) 97.5 F (36.4 C) (!) 100.8 F (38.2 C) 98.4 F (36.9 C) 97.8 F (36.6 C)  TempSrc: Oral Oral  Oral Oral  SpO2: 97% 96% 94% 97%  Weight:      Height:        Intake/Output Summary (Last 24 hours) at 10/28/2019 1004 Last data filed at 10/28/2019 [id number] Gross per 24 hour  Intake 1230 ml    Output 2165 ml  Net -935 ml   Filed Weights   10/24/19 2123 10/25/19 1820  Weight: 59.4 kg 59.4 kg    Examination:  Constitutional: NAD, in bed, appears comfortable Eyes: No scleral icterus ENMT: MMM Neck: normal, supple Respiratory: Clear bilaterally, no wheezing, no crackles Cardiovascular: Regular rate and rhythm, no murmurs, no edema Abdomen: Soft, nontender, nondistended, bowel sounds positive Musculoskeletal: no clubbing / cyanosis.  Skin: No rashes appreciated Neuro: Equal strength, nonfocal  Data Reviewed: I have independently reviewed following labs and imaging studies   CBC: Recent Labs  Lab 10/24/19 1800 10/26/19 0348 10/27/19 0521 10/28/19 0411  WBC 5.8 9.5 7.6 7.3  NEUTROABS 4.5  --   --   --   HGB 12.1 10.4* 9.7* 8.4*  HCT 37.6 32.3* 30.1* 26.5*  MCV 95.7 95.0 95.0 95.0  PLT 217 164 169 [id number]   Basic Metabolic Panel: Recent Labs  Lab 10/24/19 1800 10/26/19 0348 10/27/19 0521 10/28/19 0411  NA 138 136 136 134*  K 4.1 4.3 4.0 4.1  CL 100 103 103 102  CO2 29 23 26 26   GLUCOSE 176* 165* 114* 138*  BUN 18 17 20  26*  CREATININE 0.84 0.65 0.71 0.65  CALCIUM 8.9 8.5* 8.6* 8.2*   Liver Function Tests: Recent Labs  Lab 10/26/19 0348  AST 25  ALT 21  ALKPHOS 46  BILITOT 0.8  PROT 6.1*  ALBUMIN 3.4*   Coagulation Profile: Recent Labs  Lab 10/24/19 1800  INR 1.0   HbA1C: No results for input(s): HGBA1C in the last 72 hours. CBG: No results for input(s): GLUCAP in the last 168 hours.  Recent Results (from the past 240 hour(s))  Respiratory Panel by RT PCR (Flu A&B, Covid) - Nasopharyngeal Swab     Status: None   Collection Time: 10/24/19  7:11 PM   Specimen: Nasopharyngeal Swab  Result Value Ref Range Status   SARS Coronavirus 2 by RT PCR NEGATIVE NEGATIVE Final    Comment: (NOTE) SARS-CoV-2 target nucleic acids are NOT DETECTED. The SARS-CoV-2 RNA is generally detectable in upper respiratoy specimens during the acute phase of  infection. The lowest concentration of SARS-CoV-2 viral copies this assay can detect is 131 copies/mL. A negative result does not preclude SARS-Cov-2 infection and should not be used as the sole basis for treatment or other patient management decisions. A negative result may occur with  improper specimen collection/handling, submission of specimen other than nasopharyngeal swab, presence of viral mutation(s) within the areas targeted by this assay, and inadequate number of viral copies (<131 copies/mL). A negative result must be combined with clinical observations, patient history, and epidemiological information. The expected result is Negative. Fact Sheet for Patients:  PinkCheek.be Fact Sheet for Healthcare Providers:  GravelBags.it This test is not yet ap proved or cleared by the [place] FDA and  has been authorized for detection and/or diagnosis of SARS-CoV-2 by FDA under an Emergency Use Authorization (EUA). This EUA will remain  in effect (meaning this test can be used) for the duration of the COVID-19 declaration under Section 564(b)(1) of the Act, 21 U.S.C. section 360bbb-3(b)(1), unless the authorization is terminated or revoked sooner.    Influenza A by PCR NEGATIVE NEGATIVE Final  Influenza B by PCR NEGATIVE NEGATIVE Final    Comment: (NOTE) The Xpert Xpress SARS-CoV-2/FLU/RSV assay is intended as an aid in  the diagnosis of influenza from Nasopharyngeal swab specimens and  should not be used as a sole basis for treatment. Nasal washings and  aspirates are unacceptable for Xpert Xpress SARS-CoV-2/FLU/RSV  testing. Fact Sheet for Patients: PinkCheek.be Fact Sheet for Healthcare Providers: GravelBags.it This test is not yet approved or cleared by the [place] FDA and  has been authorized for detection and/or diagnosis of SARS-CoV-2 by  FDA under  an Emergency Use Authorization (EUA). This EUA will remain  in effect (meaning this test can be used) for the duration of the  Covid-19 declaration under Section 564(b)(1) of the Act, 21  U.S.C. section 360bbb-3(b)(1), unless the authorization is  terminated or revoked. Performed at [facility], [place] [address] [place], [place] [address]   Surgical PCR screen     Status: None   Collection Time: 10/25/19  5:12 AM   Specimen: Nasal Mucosa; Nasal Swab  Result Value Ref Range Status   MRSA, PCR NEGATIVE NEGATIVE Final   Staphylococcus aureus NEGATIVE NEGATIVE Final    Comment: (NOTE) The Xpert SA Assay (FDA approved for NASAL specimens in patients 67 years of age and older), is one component of a comprehensive surveillance program. It is not intended to diagnose infection nor to guide or monitor treatment. Performed at [facility], [place] [address] [place], [place] [address]   Urine Culture     Status: None   Collection Time: 10/25/19  9:40 PM   Specimen: Urine, Catheterized  Result Value Ref Range Status   Specimen Description   Final    URINE, CATHETERIZED Performed at [place] [address], [place], [place] [address]    Special Requests   Final    Immunocompromised Performed at [facility], [place] [address] [place], [place] [address]    Culture   Final    NO GROWTH Performed at [facility], [place] [address] [place], [place] [address]    Report Status 10/26/2019 FINAL  Final  SARS CORONAVIRUS 2 (TAT 6-24 HRS) Nasopharyngeal Nasopharyngeal Swab     Status: None   Collection Time: 10/27/19  3:06 PM   Specimen: Nasopharyngeal Swab  Result Value Ref Range Status   SARS Coronavirus 2 NEGATIVE NEGATIVE Final    Comment: (NOTE) SARS-CoV-2 target nucleic acids are NOT DETECTED. The SARS-CoV-2 RNA is generally detectable in upper and lower respiratory specimens during the acute phase of  infection. Negative results do not preclude SARS-CoV-2 infection, do not rule out co-infections with other pathogens, and should not be used as the sole basis for treatment or other patient management decisions. Negative results must be combined with clinical observations, patient history, and epidemiological information. The expected result is Negative. Fact Sheet for Patients: SugarRoll.be Fact Sheet for Healthcare Providers: https://www.woods-mathews.com/ This test is not yet approved or cleared by the [place] FDA and  has been authorized for detection and/or diagnosis of SARS-CoV-2 by FDA under an Emergency Use Authorization (EUA). This EUA will remain  in effect (meaning this test can be used) for the duration of the COVID-19 declaration under Section 56 4(b)(1) of the Act, 21 U.S.C. section 360bbb-3(b)(1), unless the authorization is terminated or revoked sooner. Performed at [facility], [place] [address], [place],  [address]      Radiology Studies: No results found. Pierra Skora,  MD, PhD [facility]  Between 7 am - 7 pm I am available, please contact me via Amion or Securechat  Between 7 pm - 7 am I am not available, please contact night coverage MD/APP via Amion

## 2019-10-28 NOTE — Progress Notes (Signed)
Dr. Cruzita Lederer gave instructions to D/C Foley, as it was at 08:15. Patient was encouraged to drink plenty of water through out the day and attempted to go to bathroom several times. Pt unable, bladder scan preformed with result of 360mls. MD made aware and Urology came to place Foley back in Patient. Patient tolerated it well.

## 2019-10-29 LAB — HEMOGLOBIN AND HEMATOCRIT, BLOOD
HCT: 27.1 % — ABNORMAL LOW (ref 36.0–46.0)
Hemoglobin: 8.6 g/dL — ABNORMAL LOW (ref 12.0–15.0)

## 2019-10-29 MED ORDER — PANTOPRAZOLE SODIUM 40 MG PO TBEC: 40.0000 mg | DELAYED_RELEASE_TABLET | Freq: Every day | ORAL | 0 refills | Status: AC

## 2019-10-29 MED ORDER — HYDROCODONE-ACETAMINOPHEN 5-325 MG PO TABS: 1.0000 | ORAL_TABLET | Freq: Four times a day (QID) | ORAL | 0 refills | Status: DC | PRN

## 2019-10-29 MED ORDER — ASPIRIN 325 MG PO TBEC: 325.0000 mg | DELAYED_RELEASE_TABLET | Freq: Every day | ORAL | 0 refills | Status: DC

## 2019-10-29 NOTE — Progress Notes (Signed)
     Subjective: 4 Days Post-Op Procedure(s) (LRB): INTRAMEDULLARY (IM) NAIL FEMORAL (Left) Awake, alert and oriented x 4.  Dressings left upper lateral thigh with minimal dry blood. Aquacel dressings x 2, these will remain in place for 2 weeks and be removed at office followup.  Patient reports pain as moderate.    Objective:   VITALS:  Temp:  [98.3 F (36.8 C)-99.5 F (37.5 C)] 98.3 F (36.8 C) (03/07 0631) Pulse Rate:  [88-106] 88 (03/07 0631) Resp:  [16-20] 20 (03/07 0631) BP: (111-130)/(49-55) 111/51 (03/07 0631) SpO2:  [94 %-98 %] 94 % (03/07 0631)  Neurologically intact ABD soft Neurovascular intact Sensation intact distally Intact pulses distally Dorsiflexion/Plantar flexion intact Incision: scant drainage   LABS Recent Labs    10/27/19 0521 10/28/19 0411 10/29/19 0425  HGB 9.7* 8.4* 8.6*  WBC 7.6 7.3  --   PLT 169 168  --    Recent Labs    10/27/19 0521 10/28/19 0411  NA 136 134*  K 4.0 4.1  CL 103 102  CO2 26 26  BUN 20 26*  CREATININE 0.71 0.65  GLUCOSE 114* 138*   No results for input(s): LABPT, INR in the last 72 hours.   Assessment/Plan: 4 Days Post-Op Procedure(s) (LRB): INTRAMEDULLARY (IM) NAIL FEMORAL (Left)  Advance diet Up with therapy Discharge to SNF  Follow up with Dr. Lorin Mercy in 1-1 1/2 weeks for staple removal.  Basil Dess 10/29/2019, 12:40 PMPatient ID: Holly Salazar, female   DOB: 07-22-1936, 84 y.o.   MRN: [id number]

## 2019-10-29 NOTE — Procedures (Signed)
Foley Catheter Placement Note  Indications: 84 y.o. female with urinary retention and stenotic vagina.   Pre-operative Diagnosis: Urinary retention  Post-operative Diagnosis: Same  Surgeon: Haskel Schroeder, MD  Assistants: None  Procedure Details  Patient was placed in the supine position, prepped with Betadine and draped in the usual sterile fashion. Given stenotic tight vagina, we placed one finger intravaginally to guide catheter to meatus which was palpable but not visible. We then inserted a 14 French catheter per urethra over this guiding intravaginal finger.  We achieved return of clear yellow urine and then proceeded to insert 10 mL of sterile water into the Foley balloon.  The catheter was attached to a drainage bag and secured with a StatLock.                 Complications: None; patient tolerated the procedure well.  Plan:   Continue Foley catheter to drainage on discharge.       Attending Attestation: Dr. Jeffie Pollock was available.

## 2019-10-29 NOTE — Progress Notes (Signed)
Physical Therapy Treatment Patient Details Name: Holly Salazar MRN: [id number] DOB: [birth year] Today's Date: 10/29/2019    History of Present Illness Pt s/p L hip fx with IM nail repair.    PT Comments    Slow steady progress with mobility.  Pt continues to require significant assist for safe performance of all mobility tasks and would benefit from follow up rehab at SNF level.   Follow Up Recommendations  SNF     Equipment Recommendations  None recommended by PT    Recommendations for Other Services       Precautions / Restrictions Precautions Precautions: Fall Restrictions Weight Bearing Restrictions: No LLE Weight Bearing: Weight bearing as tolerated    Mobility  Bed Mobility Overal bed mobility: Needs Assistance Bed Mobility: Sit to Supine     Supine to sit: Mod assist;+2 for physical assistance;+2 for safety/equipment Sit to supine: Mod assist;+2 for physical assistance;+2 for safety/equipment   General bed mobility comments: Increased time with cues for sequence and use of L LE to self assist.  Physical assist to manage trunk and bil LEs  Transfers Overall transfer level: Needs assistance Equipment used: Rolling walker (2 wheeled) Transfers: Sit to/from Stand Sit to Stand: Mod assist;+2 physical assistance;+2 safety/equipment         General transfer comment: cues for LE management and use of UEs to self assist.  Physical assist to bring wt up and fwd and to balance in standing  Ambulation/Gait Ambulation/Gait assistance: Min assist;Mod assist;+2 physical assistance;+2 safety/equipment Gait Distance (Feet): 28 Feet Assistive device: Rolling walker (2 wheeled) Gait Pattern/deviations: Step-to pattern;Decreased step length - right;Decreased step length - left;Shuffle;Trunk flexed Gait velocity: decr   General Gait Details: Noted intermittent buckling at L knee.  Cues for sequence, posture, position from RW and increased UE WB; physical assist for  support/balance and to manage RW   Stairs             Wheelchair Mobility    Modified Rankin (Stroke Patients Only)       Balance Overall balance assessment: Needs assistance Sitting-balance support: No upper extremity supported;Feet supported Sitting balance-Leahy Scale: Fair     Standing balance support: Bilateral upper extremity supported Standing balance-Leahy Scale: Poor                              Cognition Arousal/Alertness: Awake/alert Behavior During Therapy: WFL for tasks assessed/performed Overall Cognitive Status: No family/caregiver present to determine baseline cognitive functioning                                        Exercises      General Comments        Pertinent Vitals/Pain Pain Assessment: 0-10 Faces Pain Scale: Hurts little more Pain Location: L hip Pain Descriptors / Indicators: Sore Pain Intervention(s): Limited activity within patient's tolerance;Monitored during session;Premedicated before session    Home Living                      Prior Function            PT Goals (current goals can now be found in the care plan section) Acute Rehab PT Goals Patient Stated Goal: Regain IND PT Goal Formulation: With patient Time For Goal Achievement: 11/10/19 Potential to Achieve Goals: Good Progress towards PT goals: Progressing toward goals  Frequency    Min 3X/week      PT Plan Current plan remains appropriate    Co-evaluation              AM-PAC PT "6 Clicks" Mobility   Outcome Measure  Help needed turning from your back to your side while in a flat bed without using bedrails?: A Lot Help needed moving from lying on your back to sitting on the side of a flat bed without using bedrails?: A Lot Help needed moving to and from a bed to a chair (including a wheelchair)?: A Lot Help needed standing up from a chair using your arms (e.g., wheelchair or bedside chair)?: A Lot Help  needed to walk in hospital room?: A Lot Help needed climbing 3-5 steps with a railing? : Total 6 Click Score: 11    End of Session Equipment Utilized During Treatment: Gait belt Activity Tolerance: Patient limited by fatigue;Patient tolerated treatment well Patient left: in bed;with call bell/phone within reach;with bed alarm set Nurse Communication: Mobility status PT Visit Diagnosis: Difficulty in walking, not elsewhere classified (R26.2);Muscle weakness (generalized) (M62.81);Unsteadiness on feet (R26.81)     Time: [id number] PT Time Calculation (min) (ACUTE ONLY): 22 min  Charges:  $Gait Training: 8-22 mins                     [place] Pager [phone] Office [phone]    Kaarin Pardy 10/29/2019, 3:37 PM

## 2019-10-29 NOTE — Progress Notes (Signed)
PROGRESS NOTE  Jorgia Venerable [id number] DOB: 1936/04/17 DOA: 10/24/2019 PCP: Kathyrn Lass, MD   LOS: 5 days   Brief Narrative / Interim history: 84 year old female hypertension, osteoporosis, normally walks with a cane at baseline, came into the ED after falling at home.  She landed on the left hip and this was followed by left hip pain and inability to bear weight.  Imaging in the ED showed an impacted left femoral neck fracture.  Orthopedic surgery consulted.  Hospital course complicated by acute urinary retention with difficult Foley catheter placement for which urology was consulted.  Postop she also been experiencing significant confusion/delirium/agitation  Subjective / 24h Interval events: Quite confused again this morning, very emotional, crying, tells me that her niece is after her money and cussing her  Assessment & Plan: Principal Problem Closed displaced fracture of left femoral neck -Dr. Lorin Mercy with orthopedic surgery consulted, she underwent operative repair on 3/3 -PT recommended SNF, social worker consulted -hold discharge today due to worsening confusion  Active Problems Acute metabolic encephalopathy /in-hospital delirium -This appears to be resolved, appears postop -Discussed with the niece over the phone, patient does not appear to have previous memory problems or dementia, however the niece tells me that she has exhibited intermittent paranoid ideation in the past accusing her neighbors of cutting of her phone, placing a snake near her door at 1 point.  She also tells me that the patient has been more frail in the last several months.  -confused again this morning, paranoid ideation appears to be present, I strongly suspect a degree of dementia  Fever 3/5-3/6 overnight -Patient had recorded fever of 100.8, completely asymptomatic. This is likely postop and resolved.  Essential hypertension -Continue lisinopril, blood pressure stable Acute urinary retention -unclear  etiology, Urology consulted, had to have Foley catheter placed intraoperatively. She was given a voiding trial on 3/6, failed and Foley had to be reinserted. Would recommend discharge with Foley and outpatient follow up with Dr. Alinda Money in 2-3 weeks  Acute blood loss anemia -Expected postop,Hb stable, did not need transfusion.  Severe protein calorie malnutrition - continue supplements  Scheduled Meds: . aspirin EC  325 mg Oral Q breakfast  . Chlorhexidine Gluconate Cloth  6 each Topical Daily  . docusate sodium  100 mg Oral BID  . lisinopril  20 mg Oral Daily  . QUEtiapine  12.5 mg Oral QHS  . senna  2 tablet Oral Daily   Continuous Infusions: . lactated ringers 10 mL/hr at 10/25/19 2209   PRN Meds:.haloperidol lactate, HYDROcodone-acetaminophen, menthol-cetylpyridinium **OR** phenol, metoCLOPramide **OR** metoCLOPramide (REGLAN) injection, morphine injection, ondansetron (ZOFRAN) IV, ondansetron **OR** ondansetron (ZOFRAN) IV  DVT prophylaxis: per ortho Code Status: Full code Family Communication: d/w patient and updated her niece (Doran) Nelson Chimes [phone] over the phone, no answer on 3/6 Patient admitted from: home Anticipated d/c place: anticipate SNF within 24 hours Barriers to d/c: Monitoring acute blood loss anemia, voiding trial, febrile overnight and monitoring for resolution of fever  Consultants:  Orthopedic surgery  Urology   Procedures:  None   Microbiology  None   Antimicrobials: None     Objective: Vitals:   10/28/19 1039 10/28/19 1349 10/28/19 2228 10/29/19 0631  BP: (!) 121/50 (!) 130/49 (!) 130/55 (!) 111/51  Pulse:  91 (!) 106 88  Resp:  20 16 20   Temp:  99.5 F (37.5 C) 98.3 F (36.8 C) 98.3 F (36.8 C)  TempSrc:  Oral Oral Oral  SpO2:  98% 97% 94%  Weight:  Height:        Intake/Output Summary (Last 24 hours) at 10/29/2019 1000 Last data filed at 10/29/2019 [id number] Gross per 24 hour  Intake 240 ml  Output 1400 ml  Net -1160 ml    Filed Weights   10/24/19 2123 10/25/19 1820  Weight: 59.4 kg 59.4 kg    Examination:  Constitutional: crying Eyes: no icterus ENMT: mmm Neck: normal, supple Respiratory: cta bil, no wheezing  Cardiovascular: rrr, no murmurs, no edema Abdomen: soft, nt, nd, bs+ Musculoskeletal: no clubbing / cyanosis.  Skin: no rashes Neuro: non focal   Data Reviewed: I have independently reviewed following labs and imaging studies   CBC: Recent Labs  Lab 10/24/19 1800 10/26/19 0348 10/27/19 0521 10/28/19 0411 10/29/19 0425  WBC 5.8 9.5 7.6 7.3  --   NEUTROABS 4.5  --   --   --   --   HGB 12.1 10.4* 9.7* 8.4* 8.6*  HCT 37.6 32.3* 30.1* 26.5* 27.1*  MCV 95.7 95.0 95.0 95.0  --   PLT 217 164 169 168  --    Basic Metabolic Panel: Recent Labs  Lab 10/24/19 1800 10/26/19 0348 10/27/19 0521 10/28/19 0411  NA 138 136 136 134*  K 4.1 4.3 4.0 4.1  CL 100 103 103 102  CO2 29 23 26 26   GLUCOSE 176* 165* 114* 138*  BUN 18 17 20  26*  CREATININE 0.84 0.65 0.71 0.65  CALCIUM 8.9 8.5* 8.6* 8.2*   Liver Function Tests: Recent Labs  Lab 10/26/19 0348  AST 25  ALT 21  ALKPHOS 46  BILITOT 0.8  PROT 6.1*  ALBUMIN 3.4*   Coagulation Profile: Recent Labs  Lab 10/24/19 1800  INR 1.0   HbA1C: No results for input(s): HGBA1C in the last 72 hours. CBG: No results for input(s): GLUCAP in the last 168 hours.  Recent Results (from the past 240 hour(s))  Respiratory Panel by RT PCR (Flu A&B, Covid) - Nasopharyngeal Swab     Status: None   Collection Time: 10/24/19  7:11 PM   Specimen: Nasopharyngeal Swab  Result Value Ref Range Status   SARS Coronavirus 2 by RT PCR NEGATIVE NEGATIVE Final    Comment: (NOTE) SARS-CoV-2 target nucleic acids are NOT DETECTED. The SARS-CoV-2 RNA is generally detectable in upper respiratoy specimens during the acute phase of infection. The lowest concentration of SARS-CoV-2 viral copies this assay can detect is 131 copies/mL. A negative result does  not preclude SARS-Cov-2 infection and should not be used as the sole basis for treatment or other patient management decisions. A negative result may occur with  improper specimen collection/handling, submission of specimen other than nasopharyngeal swab, presence of viral mutation(s) within the areas targeted by this assay, and inadequate number of viral copies (<131 copies/mL). A negative result must be combined with clinical observations, patient history, and epidemiological information. The expected result is Negative. Fact Sheet for Patients:  PinkCheek.be Fact Sheet for Healthcare Providers:  GravelBags.it This test is not yet ap proved or cleared by the [place] FDA and  has been authorized for detection and/or diagnosis of SARS-CoV-2 by FDA under an Emergency Use Authorization (EUA). This EUA will remain  in effect (meaning this test can be used) for the duration of the COVID-19 declaration under Section 564(b)(1) of the Act, 21 U.S.C. section 360bbb-3(b)(1), unless the authorization is terminated or revoked sooner.    Influenza A by PCR NEGATIVE NEGATIVE Final   Influenza B by PCR NEGATIVE NEGATIVE Final  Comment: (NOTE) The Xpert Xpress SARS-CoV-2/FLU/RSV assay is intended as an aid in  the diagnosis of influenza from Nasopharyngeal swab specimens and  should not be used as a sole basis for treatment. Nasal washings and  aspirates are unacceptable for Xpert Xpress SARS-CoV-2/FLU/RSV  testing. Fact Sheet for Patients: PinkCheek.be Fact Sheet for Healthcare Providers: GravelBags.it This test is not yet approved or cleared by the [place] FDA and  has been authorized for detection and/or diagnosis of SARS-CoV-2 by  FDA under an Emergency Use Authorization (EUA). This EUA will remain  in effect (meaning this test can be used) for the duration of the   Covid-19 declaration under Section 564(b)(1) of the Act, 21  U.S.C. section 360bbb-3(b)(1), unless the authorization is  terminated or revoked. Performed at [facility], [place] [address] [place], [place] [address]   Surgical PCR screen     Status: None   Collection Time: 10/25/19  5:12 AM   Specimen: Nasal Mucosa; Nasal Swab  Result Value Ref Range Status   MRSA, PCR NEGATIVE NEGATIVE Final   Staphylococcus aureus NEGATIVE NEGATIVE Final    Comment: (NOTE) The Xpert SA Assay (FDA approved for NASAL specimens in patients 16 years of age and older), is one component of a comprehensive surveillance program. It is not intended to diagnose infection nor to guide or monitor treatment. Performed at [facility], [place] [address], [place], [place] [address]   Urine Culture     Status: None   Collection Time: 10/25/19  9:40 PM   Specimen: Urine, Catheterized  Result Value Ref Range Status   Specimen Description   Final    URINE, CATHETERIZED Performed at [place] [address], [place], [place] [address]    Special Requests   Final    Immunocompromised Performed at [facility], [place] [address], [place], [place] [address]    Culture   Final    NO GROWTH Performed at [facility], [place] [address], [place], [place] [address]    Report Status 10/26/2019 FINAL  Final  SARS CORONAVIRUS 2 (TAT 6-24 HRS) Nasopharyngeal Nasopharyngeal Swab     Status: None   Collection Time: 10/27/19  3:06 PM   Specimen: Nasopharyngeal Swab  Result Value Ref Range Status   SARS Coronavirus 2 NEGATIVE NEGATIVE Final    Comment: (NOTE) SARS-CoV-2 target nucleic acids are NOT DETECTED. The SARS-CoV-2 RNA is generally detectable in upper and lower respiratory specimens during the acute phase of infection. Negative results do not preclude SARS-CoV-2 infection, do not rule out co-infections with other pathogens, and should  not be used as the sole basis for treatment or other patient management decisions. Negative results must be combined with clinical observations, patient history, and epidemiological information. The expected result is Negative. Fact Sheet for Patients: SugarRoll.be Fact Sheet for Healthcare Providers: https://www.woods-mathews.com/ This test is not yet approved or cleared by the [place] FDA and  has been authorized for detection and/or diagnosis of SARS-CoV-2 by FDA under an Emergency Use Authorization (EUA). This EUA will remain  in effect (meaning this test can be used) for the duration of the COVID-19 declaration under Section 56 4(b)(1) of the Act, 21 U.S.C. section 360bbb-3(b)(1), unless the authorization is terminated or revoked sooner. Performed at [facility], [place] [address], [place], [place] [address]      Radiology Studies: No results found. Marzetta Board, MD, PhD [facility]  Between 7 am - 7  pm I am available, please contact me via Amion or Securechat  Between 7 pm - 7 am I am not available, please contact night coverage MD/APP via Amion

## 2019-10-29 NOTE — Progress Notes (Signed)
Physical Therapy Treatment Patient Details Name: Holly Salazar MRN: [id number] DOB: 24-Feb-1936 Today's Date: 10/29/2019    History of Present Illness Pt s/p L hip fx with IM nail repair.    PT Comments    Pt cooperative and wanting OOB but highly distractible and focussed on family issues "my niece just wants everything, I just want to go home to [place] and die and be buried by my parents".   Follow Up Recommendations  SNF     Equipment Recommendations  None recommended by PT    Recommendations for Other Services       Precautions / Restrictions Precautions Precautions: Fall Restrictions Weight Bearing Restrictions: No LLE Weight Bearing: Weight bearing as tolerated    Mobility  Bed Mobility Overal bed mobility: Needs Assistance Bed Mobility: Supine to Sit     Supine to sit: Mod assist;+2 for physical assistance;+2 for safety/equipment     General bed mobility comments: Increased time with cues for sequence and use of L LE to self assist.  Physical assist to manage trunk and bil LEs  Transfers Overall transfer level: Needs assistance Equipment used: Rolling walker (2 wheeled) Transfers: Sit to/from Stand Sit to Stand: Mod assist;+2 physical assistance;+2 safety/equipment         General transfer comment: cues for LE management and use of UEs to self assist.  Physical assist to bring wt up and fwd and to balance in standing  Ambulation/Gait Ambulation/Gait assistance: Min assist;Mod assist;+2 physical assistance;+2 safety/equipment Gait Distance (Feet): 24 Feet Assistive device: Rolling walker (2 wheeled) Gait Pattern/deviations: Step-to pattern;Decreased step length - right;Decreased step length - left;Shuffle;Trunk flexed Gait velocity: decr   General Gait Details: Noted intermittent buckling at L knee.  Cues for sequence, posture, position from RW and increased UE WB; physical assist for support/balance and to manage RW   Stairs              Wheelchair Mobility    Modified Rankin (Stroke Patients Only)       Balance Overall balance assessment: Needs assistance Sitting-balance support: No upper extremity supported;Feet supported Sitting balance-Leahy Scale: Fair     Standing balance support: Bilateral upper extremity supported Standing balance-Leahy Scale: Poor                              Cognition Arousal/Alertness: Awake/alert Behavior During Therapy: WFL for tasks assessed/performed Overall Cognitive Status: No family/caregiver present to determine baseline cognitive functioning                                        Exercises      General Comments        Pertinent Vitals/Pain Pain Assessment: Faces Faces Pain Scale: Hurts little more Pain Location: L hip Pain Descriptors / Indicators: Sore Pain Intervention(s): Limited activity within patient's tolerance;Monitored during session;Premedicated before session;Ice applied    Home Living                      Prior Function            PT Goals (current goals can now be found in the care plan section) Acute Rehab PT Goals Patient Stated Goal: Regain IND PT Goal Formulation: With patient Time For Goal Achievement: 11/10/19 Potential to Achieve Goals: Good Progress towards PT goals: Progressing toward goals    Frequency  Min 3X/week      PT Plan Current plan remains appropriate    Co-evaluation              AM-PAC PT "6 Clicks" Mobility   Outcome Measure  Help needed turning from your back to your side while in a flat bed without using bedrails?: A Lot Help needed moving from lying on your back to sitting on the side of a flat bed without using bedrails?: A Lot Help needed moving to and from a bed to a chair (including a wheelchair)?: A Lot Help needed standing up from a chair using your arms (e.g., wheelchair or bedside chair)?: A Lot Help needed to walk in hospital room?: A Lot Help  needed climbing 3-5 steps with a railing? : Total 6 Click Score: 11    End of Session Equipment Utilized During Treatment: Gait belt Activity Tolerance: Patient limited by fatigue Patient left: in chair;with call bell/phone within reach;with chair alarm set Nurse Communication: Mobility status PT Visit Diagnosis: Difficulty in walking, not elsewhere classified (R26.2);Muscle weakness (generalized) (M62.81);Unsteadiness on feet (R26.81)     Time: 1008-1030 PT Time Calculation (min) (ACUTE ONLY): 22 min  Charges:  $Gait Training: 8-22 mins                     [place] Pager [phone] Office [phone]    Egan Sahlin 10/29/2019, 12:04 PM

## 2019-10-29 NOTE — Discharge Summary (Deleted)
Physician Discharge Summary  Holly Salazar [id number] DOB: 22-Nov-1935 DOA: 10/24/2019  PCP: Kathyrn Lass, MD  Admit date: 10/24/2019 Discharge date: 10/29/2019  Admitted From: home Disposition:  SNF  Recommendations for Outpatient Follow-up:  1. Follow up with PCP in 1-2 weeks 2. Follow up with Dr. Lorin Salazar with orthopedics as scheduled 3. Follow up with Dr. Alinda Salazar in 2 weeks  Home Health: none Equipment/Devices: none  Discharge Condition: stable CODE STATUS: Full code Diet recommendation: regular  HPI: Per admitting MD, Holly Salazar is a 84 y.o. female with medical history significant of HTN, osteoporosis. Pt normally walks with cane at baseline.  Doesn't walk up stairs anymore.  No CP nor SOB with ambulation. Pt presents to ED after mechanical fall, slid out of chair at home.  Landed on L hip.  Severe L hip pain and inability to bear weight post fall.  EMS transported to ED.  Hospital Course / Discharge diagnoses: Principal Problem Closed displaced fracture of left femoral neck -Dr. Lorin Salazar with orthopedic surgery consulted, she underwent operative repair on 3/3. Recovered well post op, PT recommended SNF, social worker consulted. DVT prophylaxis with full dose aspirin for a month per orthopedic surgery. Will also add PPI in this interval.  COVID-19 Labs  Lab Results  Component Value Date   SARSCOV2NAA NEGATIVE 10/27/2019   [place] NEGATIVE 10/24/2019    Active Problems Acute metabolic encephalopathy /in-hospital delirium -resolved, appeared postop. Niece (POA) denies previous memory problems or dementia, however she has been having intermittent paranoid ideation in the past. Given resolution of acute in-hospital delirium her reported paranoid ideation can be worked up as an outpatient  Fever 3/5-3/6 overnight -Patient had recorded fever of 100.8, completely asymptomatic.  This is likely postop and resolved.  Essential hypertension -Continue lisinopril, blood pressure  stable Acute urinary retention -unclear etiology, Urology consulted, had to have Foley catheter placed intraoperatively. She was given a voiding trial on 3/6, failed and Foley had to be reinserted. Would recommend discharge with Foley and outpatient follow up with Dr. Alinda Salazar in 2-3 weeks  Acute blood loss anemia -Expected postop, Hb stable, did not need transfusion.  Severe protein calorie malnutrition - continue supplements   Discharge Instructions   Allergies as of 10/29/2019   No Known Allergies     Medication List    TAKE these medications   aspirin 325 MG EC tablet Take 1 tablet (325 mg total) by mouth daily with breakfast. Start taking on: October 30, 2019   HYDROcodone-acetaminophen 5-325 MG tablet Commonly known as: NORCO/VICODIN Take 1-2 tablets by mouth every 6 (six) hours as needed for moderate pain.   lisinopril 20 MG tablet Commonly known as: ZESTRIL Take 20 mg by mouth daily.   multivitamin with minerals Tabs tablet Take 1 tablet by mouth daily.   naproxen sodium 220 MG tablet Commonly known as: ALEVE Take 220 mg by mouth daily as needed (pain).   pantoprazole 40 MG tablet Commonly known as: Protonix Take 1 tablet (40 mg total) by mouth daily.       Contact information for follow-up providers    Holly Killings, MD. Schedule an appointment as soon as possible for a visit in 2 week(s).   Specialty: Orthopedic Surgery Contact information: [place] [address] [phone]            Contact information for after-discharge care    Destination    [facility] .   Service: Skilled Nursing Contact information: [place] [phone]                  Consultations:  Orthopedic surgery   Procedures/Studies:  DG Chest 1 View  Result Date: 10/24/2019 CLINICAL DATA:  Hip fracture.  Fall EXAM: CHEST  1 VIEW COMPARISON:  None. FINDINGS: Pulmonary hyperinflation. Lungs  clear without infiltrate or effusion. Negative for heart failure. IMPRESSION: COPD without acute abnormality. Electronically Signed   By: Franchot Gallo M.D.   On: 10/24/2019 19:09   DG C-Arm 1-60 Min-No Report  Result Date: 10/25/2019 Fluoroscopy was utilized by the requesting physician.  No radiographic interpretation.   DG Hip Unilat W or Wo Pelvis 2-3 Views Left  Result Date: 10/24/2019 CLINICAL DATA:  Fall EXAM: DG HIP (WITH OR WITHOUT PELVIS) 2-3V LEFT COMPARISON:  None. FINDINGS: Impacted fracture left femoral neck. Left hip joint otherwise normal Surgical repair of right hip fracture with screw and rod. No other pelvic lesion IMPRESSION: Impacted fracture left femoral neck. Electronically Signed   By: Franchot Gallo M.D.   On: 10/24/2019 19:08   DG Femur Min 2 Views Left  Result Date: 10/24/2019 CLINICAL DATA:  Fall EXAM: LEFT FEMUR 2 VIEWS COMPARISON:  None. FINDINGS: Fracture left femoral neck with impaction. No other fracture of the femur Advanced degenerative change in the knee joint with joint space narrowing and chondrocalcinosis IMPRESSION: Left femoral neck impacted fracture. Electronically Signed   By: Franchot Gallo M.D.   On: 10/24/2019 19:07    Subjective: - no chest pain, shortness of breath, no abdominal pain, nausea or vomiting.   Discharge Exam: BP (!) 111/51 (BP Location: Right Arm)   Pulse 88   Temp 98.3 F (36.8 C) (Oral)   Resp 20   Ht 5\' 7"  (1.702 m)   Wt 59.4 kg   SpO2 94%   BMI 20.51 kg/m   General: NAD Cardiovascular: RRR, S1/S2 +, no rubs, no gallops Respiratory: CTA bilaterally, no wheezing, no rhonchi Abdominal: Soft, NT, ND, bowel sounds + Extremities: no edema, no cyanosis  The results of significant diagnostics from this hospitalization (including imaging, microbiology, ancillary and laboratory) are listed below for reference.     Microbiology: Recent Results (from the past 240 hour(s))  Respiratory Panel by RT PCR (Flu A&B, Covid) -  Nasopharyngeal Swab     Status: None   Collection Time: 10/24/19  7:11 PM   Specimen: Nasopharyngeal Swab  Result Value Ref Range Status   SARS Coronavirus 2 by RT PCR NEGATIVE NEGATIVE Final    Comment: (NOTE) SARS-CoV-2 target nucleic acids are NOT DETECTED. The SARS-CoV-2 RNA is generally detectable in upper respiratoy specimens during the acute phase of infection. The lowest concentration of SARS-CoV-2 viral copies this assay can detect is 131 copies/mL. A negative result does not preclude SARS-Cov-2 infection and should not be used as the sole basis for treatment or other patient management decisions. A negative result may occur with  improper specimen collection/handling, submission of specimen other than nasopharyngeal swab, presence of viral mutation(s) within the areas targeted by this assay, and inadequate number of viral copies (<131 copies/mL). A negative result must be combined with clinical observations, patient history, and epidemiological information. The expected result is Negative. Fact Sheet for Patients:  PinkCheek.be Fact Sheet for Healthcare Providers:  GravelBags.it This test is not yet ap proved or cleared by the [place] FDA and  has been authorized for detection and/or diagnosis of SARS-CoV-2 by FDA under an Emergency Use Authorization (EUA). This EUA  will remain  in effect (meaning this test can be used) for the duration of the COVID-19 declaration under Section 564(b)(1) of the Act, 21 U.S.C. section 360bbb-3(b)(1), unless the authorization is terminated or revoked sooner.    Influenza A by PCR NEGATIVE NEGATIVE Final   Influenza B by PCR NEGATIVE NEGATIVE Final    Comment: (NOTE) The Xpert Xpress SARS-CoV-2/FLU/RSV assay is intended as an aid in  the diagnosis of influenza from Nasopharyngeal swab specimens and  should not be used as a sole basis for treatment. Nasal washings and  aspirates  are unacceptable for Xpert Xpress SARS-CoV-2/FLU/RSV  testing. Fact Sheet for Patients: PinkCheek.be Fact Sheet for Healthcare Providers: GravelBags.it This test is not yet approved or cleared by the [place] FDA and  has been authorized for detection and/or diagnosis of SARS-CoV-2 by  FDA under an Emergency Use Authorization (EUA). This EUA will remain  in effect (meaning this test can be used) for the duration of the  Covid-19 declaration under Section 564(b)(1) of the Act, 21  U.S.C. section 360bbb-3(b)(1), unless the authorization is  terminated or revoked. Performed at [facility], [place] [address] [place], [place] [address]   Surgical PCR screen     Status: None   Collection Time: 10/25/19  5:12 AM   Specimen: Nasal Mucosa; Nasal Swab  Result Value Ref Range Status   MRSA, PCR NEGATIVE NEGATIVE Final   Staphylococcus aureus NEGATIVE NEGATIVE Final    Comment: (NOTE) The Xpert SA Assay (FDA approved for NASAL specimens in patients 56 years of age and older), is one component of a comprehensive surveillance program. It is not intended to diagnose infection nor to guide or monitor treatment. Performed at [facility], [place] [address], [place], [place] [address]   Urine Culture     Status: None   Collection Time: 10/25/19  9:40 PM   Specimen: Urine, Catheterized  Result Value Ref Range Status   Specimen Description   Final    URINE, CATHETERIZED Performed at [facility] [address] [place], [place] [address]    Special Requests   Final    Immunocompromised Performed at [facility], [place] [address] [place], [place] [address]    Culture   Final    NO GROWTH Performed at [facility], [place] [address] [place], [place] [address]    Report Status 10/26/2019 FINAL  Final  SARS CORONAVIRUS 2 (TAT 6-24 HRS) Nasopharyngeal  Nasopharyngeal Swab     Status: None   Collection Time: 10/27/19  3:06 PM   Specimen: Nasopharyngeal Swab  Result Value Ref Range Status   SARS Coronavirus 2 NEGATIVE NEGATIVE Final    Comment: (NOTE) SARS-CoV-2 target nucleic acids are NOT DETECTED. The SARS-CoV-2 RNA is generally detectable in upper and lower respiratory specimens during the acute phase of infection. Negative results do not preclude SARS-CoV-2 infection, do not rule out co-infections with other pathogens, and should not be used as the sole basis for treatment or other patient management decisions. Negative results must be combined with clinical observations, patient history, and epidemiological information. The expected result is Negative. Fact Sheet for Patients: SugarRoll.be Fact Sheet for Healthcare Providers: https://www.woods-mathews.com/ This test is not yet approved or cleared by the [place] FDA and  has been authorized for detection and/or diagnosis of SARS-CoV-2 by FDA under an Emergency Use Authorization (EUA). This EUA will remain  in effect (meaning this test can be used) for the duration  of the COVID-19 declaration under Section 56 4(b)(1) of the Act, 21 U.S.C. section 360bbb-3(b)(1), unless the authorization is terminated or revoked sooner. Performed at [facility], [place] [address] [place], [place] [address]      Labs: Basic Metabolic Panel: Recent Labs  Lab 10/24/19 1800 10/26/19 0348 10/27/19 0521 10/28/19 0411  NA 138 136 136 134*  K 4.1 4.3 4.0 4.1  CL 100 103 103 102  CO2 29 23 26 26   GLUCOSE 176* 165* 114* 138*  BUN 18 17 20  26*  CREATININE 0.84 0.65 0.71 0.65  CALCIUM 8.9 8.5* 8.6* 8.2*   Liver Function Tests: Recent Labs  Lab 10/26/19 0348  AST 25  ALT 21  ALKPHOS 46  BILITOT 0.8  PROT 6.1*  ALBUMIN 3.4*   CBC: Recent Labs  Lab 10/24/19 1800 10/26/19 0348 10/27/19 0521 10/28/19 0411 10/29/19 0425  WBC 5.8  9.5 7.6 7.3  --   NEUTROABS 4.5  --   --   --   --   HGB 12.1 10.4* 9.7* 8.4* 8.6*  HCT 37.6 32.3* 30.1* 26.5* 27.1*  MCV 95.7 95.0 95.0 95.0  --   PLT 217 164 169 168  --    CBG: No results for input(s): GLUCAP in the last 168 hours. Hgb A1c No results for input(s): HGBA1C in the last 72 hours. Lipid Profile No results for input(s): CHOL, HDL, LDLCALC, TRIG, CHOLHDL, LDLDIRECT in the last 72 hours. Thyroid function studies No results for input(s): TSH, T4TOTAL, T3FREE, THYROIDAB in the last 72 hours.  Invalid input(s): FREET3 Urinalysis    Component Value Date/Time   COLORURINE YELLOW 10/26/2019 1807   APPEARANCEUR CLEAR 10/26/2019 1807   LABSPEC 1.014 10/26/2019 1807   PHURINE 7.0 10/26/2019 1807   GLUCOSEU NEGATIVE 10/26/2019 1807   HGBUR NEGATIVE 10/26/2019 1807   BILIRUBINUR NEGATIVE 10/26/2019 1807   KETONESUR NEGATIVE 10/26/2019 1807   PROTEINUR NEGATIVE 10/26/2019 1807   NITRITE NEGATIVE 10/26/2019 1807   LEUKOCYTESUR NEGATIVE 10/26/2019 1807    FURTHER DISCHARGE INSTRUCTIONS:   Get Medicines reviewed and adjusted: Please take all your medications with you for your next visit with your Primary MD   Laboratory/radiological data: Please request your Primary MD to go over all hospital tests and procedure/radiological results at the follow up, please ask your Primary MD to get all Hospital records sent to his/her office.   In some cases, they will be blood work, cultures and biopsy results pending at the time of your discharge. Please request that your primary care M.D. goes through all the records of your hospital data and follows up on these results.   Also Note the following: If you experience worsening of your admission symptoms, develop shortness of breath, life threatening emergency, suicidal or homicidal thoughts you must seek medical attention immediately by calling 911 or calling your MD immediately  if symptoms less severe.   You must read complete  instructions/literature along with all the possible adverse reactions/side effects for all the Medicines you take and that have been prescribed to you. Take any new Medicines after you have completely understood and accpet all the possible adverse reactions/side effects.    Do not drive when taking Pain medications or sleeping medications (Benzodaizepines)   Do not take more than prescribed Pain, Sleep and Anxiety Medications. It is not advisable to combine anxiety,sleep and pain medications without talking with your primary care practitioner   Special Instructions: If you have smoked or chewed Tobacco  in the last 2  yrs please stop smoking, stop any regular Alcohol  and or any Recreational drug use.   Wear Seat belts while driving.   Please note: You were cared for by a hospitalist during your hospital stay. Once you are discharged, your primary care physician will handle any further medical issues. Please note that NO REFILLS for any discharge medications will be authorized once you are discharged, as it is imperative that you return to your primary care physician (or establish a relationship with a primary care physician if you do not have one) for your post hospital discharge needs so that they can reassess your need for medications and monitor your lab values.  Time coordinating discharge: 35 minutes  SIGNED:  Marzetta Board, MD, PhD 10/29/2019, 8:04 AM

## 2019-10-30 DIAGNOSIS — R41841 Cognitive communication deficit: Secondary | ICD-10-CM | POA: Diagnosis not present

## 2019-10-30 DIAGNOSIS — Z20822 Contact with and (suspected) exposure to covid-19: Secondary | ICD-10-CM | POA: Diagnosis not present

## 2019-10-30 DIAGNOSIS — R4182 Altered mental status, unspecified: Secondary | ICD-10-CM | POA: Diagnosis not present

## 2019-10-30 DIAGNOSIS — Z79899 Other long term (current) drug therapy: Secondary | ICD-10-CM | POA: Diagnosis not present

## 2019-10-30 DIAGNOSIS — I951 Orthostatic hypotension: Secondary | ICD-10-CM | POA: Diagnosis not present

## 2019-10-30 DIAGNOSIS — U071 COVID-19: Secondary | ICD-10-CM | POA: Diagnosis not present

## 2019-10-30 DIAGNOSIS — Z7982 Long term (current) use of aspirin: Secondary | ICD-10-CM | POA: Diagnosis not present

## 2019-10-30 DIAGNOSIS — I9589 Other hypotension: Secondary | ICD-10-CM | POA: Diagnosis not present

## 2019-10-30 DIAGNOSIS — S72002A Fracture of unspecified part of neck of left femur, initial encounter for closed fracture: Secondary | ICD-10-CM | POA: Diagnosis not present

## 2019-10-30 DIAGNOSIS — R195 Other fecal abnormalities: Secondary | ICD-10-CM | POA: Diagnosis not present

## 2019-10-30 DIAGNOSIS — R0989 Other specified symptoms and signs involving the circulatory and respiratory systems: Secondary | ICD-10-CM | POA: Diagnosis not present

## 2019-10-30 DIAGNOSIS — M2550 Pain in unspecified joint: Secondary | ICD-10-CM | POA: Diagnosis not present

## 2019-10-30 DIAGNOSIS — R339 Retention of urine, unspecified: Secondary | ICD-10-CM | POA: Diagnosis not present

## 2019-10-30 DIAGNOSIS — Z23 Encounter for immunization: Secondary | ICD-10-CM | POA: Diagnosis not present

## 2019-10-30 DIAGNOSIS — F22 Delusional disorders: Secondary | ICD-10-CM | POA: Diagnosis not present

## 2019-10-30 DIAGNOSIS — L89611 Pressure ulcer of right heel, stage 1: Secondary | ICD-10-CM | POA: Diagnosis not present

## 2019-10-30 DIAGNOSIS — I959 Hypotension, unspecified: Secondary | ICD-10-CM | POA: Diagnosis not present

## 2019-10-30 DIAGNOSIS — Z85828 Personal history of other malignant neoplasm of skin: Secondary | ICD-10-CM | POA: Diagnosis not present

## 2019-10-30 DIAGNOSIS — S72142S Displaced intertrochanteric fracture of left femur, sequela: Secondary | ICD-10-CM | POA: Diagnosis not present

## 2019-10-30 DIAGNOSIS — F3489 Other specified persistent mood disorders: Secondary | ICD-10-CM | POA: Diagnosis not present

## 2019-10-30 DIAGNOSIS — L97421 Non-pressure chronic ulcer of left heel and midfoot limited to breakdown of skin: Secondary | ICD-10-CM | POA: Diagnosis not present

## 2019-10-30 DIAGNOSIS — T83511A Infection and inflammatory reaction due to indwelling urethral catheter, initial encounter: Secondary | ICD-10-CM | POA: Diagnosis not present

## 2019-10-30 DIAGNOSIS — L89609 Pressure ulcer of unspecified heel, unspecified stage: Secondary | ICD-10-CM | POA: Diagnosis not present

## 2019-10-30 DIAGNOSIS — R2681 Unsteadiness on feet: Secondary | ICD-10-CM | POA: Diagnosis not present

## 2019-10-30 DIAGNOSIS — M810 Age-related osteoporosis without current pathological fracture: Secondary | ICD-10-CM | POA: Diagnosis not present

## 2019-10-30 DIAGNOSIS — E43 Unspecified severe protein-calorie malnutrition: Secondary | ICD-10-CM | POA: Diagnosis not present

## 2019-10-30 DIAGNOSIS — K5901 Slow transit constipation: Secondary | ICD-10-CM | POA: Diagnosis not present

## 2019-10-30 DIAGNOSIS — R404 Transient alteration of awareness: Secondary | ICD-10-CM | POA: Diagnosis not present

## 2019-10-30 DIAGNOSIS — S7222XA Displaced subtrochanteric fracture of left femur, initial encounter for closed fracture: Secondary | ICD-10-CM | POA: Diagnosis not present

## 2019-10-30 DIAGNOSIS — L89610 Pressure ulcer of right heel, unstageable: Secondary | ICD-10-CM | POA: Diagnosis not present

## 2019-10-30 DIAGNOSIS — D649 Anemia, unspecified: Secondary | ICD-10-CM | POA: Diagnosis not present

## 2019-10-30 DIAGNOSIS — R338 Other retention of urine: Secondary | ICD-10-CM | POA: Diagnosis not present

## 2019-10-30 DIAGNOSIS — F419 Anxiety disorder, unspecified: Secondary | ICD-10-CM | POA: Diagnosis not present

## 2019-10-30 DIAGNOSIS — R918 Other nonspecific abnormal finding of lung field: Secondary | ICD-10-CM | POA: Diagnosis not present

## 2019-10-30 DIAGNOSIS — R531 Weakness: Secondary | ICD-10-CM | POA: Diagnosis not present

## 2019-10-30 DIAGNOSIS — R630 Anorexia: Secondary | ICD-10-CM | POA: Diagnosis not present

## 2019-10-30 DIAGNOSIS — M6281 Muscle weakness (generalized): Secondary | ICD-10-CM | POA: Diagnosis not present

## 2019-10-30 DIAGNOSIS — R5381 Other malaise: Secondary | ICD-10-CM | POA: Diagnosis not present

## 2019-10-30 DIAGNOSIS — F064 Anxiety disorder due to known physiological condition: Secondary | ICD-10-CM | POA: Diagnosis not present

## 2019-10-30 DIAGNOSIS — D6489 Other specified anemias: Secondary | ICD-10-CM | POA: Diagnosis not present

## 2019-10-30 DIAGNOSIS — S72002D Fracture of unspecified part of neck of left femur, subsequent encounter for closed fracture with routine healing: Secondary | ICD-10-CM | POA: Diagnosis not present

## 2019-10-30 DIAGNOSIS — R262 Difficulty in walking, not elsewhere classified: Secondary | ICD-10-CM | POA: Diagnosis not present

## 2019-10-30 DIAGNOSIS — Z8781 Personal history of (healed) traumatic fracture: Secondary | ICD-10-CM | POA: Diagnosis not present

## 2019-10-30 DIAGNOSIS — L89621 Pressure ulcer of left heel, stage 1: Secondary | ICD-10-CM | POA: Diagnosis not present

## 2019-10-30 DIAGNOSIS — N390 Urinary tract infection, site not specified: Secondary | ICD-10-CM | POA: Diagnosis not present

## 2019-10-30 DIAGNOSIS — Z7401 Bed confinement status: Secondary | ICD-10-CM | POA: Diagnosis not present

## 2019-10-30 DIAGNOSIS — Z4789 Encounter for other orthopedic aftercare: Secondary | ICD-10-CM | POA: Diagnosis not present

## 2019-10-30 DIAGNOSIS — R29898 Other symptoms and signs involving the musculoskeletal system: Secondary | ICD-10-CM | POA: Diagnosis not present

## 2019-10-30 DIAGNOSIS — F39 Unspecified mood [affective] disorder: Secondary | ICD-10-CM | POA: Diagnosis not present

## 2019-10-30 DIAGNOSIS — R402411 Glasgow coma scale score 13-15, in the field [EMT or ambulance]: Secondary | ICD-10-CM | POA: Diagnosis not present

## 2019-10-30 DIAGNOSIS — L89620 Pressure ulcer of left heel, unstageable: Secondary | ICD-10-CM | POA: Diagnosis not present

## 2019-10-30 DIAGNOSIS — S72142A Displaced intertrochanteric fracture of left femur, initial encounter for closed fracture: Secondary | ICD-10-CM | POA: Diagnosis not present

## 2019-10-30 DIAGNOSIS — L97411 Non-pressure chronic ulcer of right heel and midfoot limited to breakdown of skin: Secondary | ICD-10-CM | POA: Diagnosis not present

## 2019-10-30 DIAGNOSIS — I10 Essential (primary) hypertension: Secondary | ICD-10-CM | POA: Diagnosis not present

## 2019-10-30 DIAGNOSIS — F4323 Adjustment disorder with mixed anxiety and depressed mood: Secondary | ICD-10-CM | POA: Diagnosis not present

## 2019-10-30 DIAGNOSIS — E440 Moderate protein-calorie malnutrition: Secondary | ICD-10-CM | POA: Diagnosis not present

## 2019-10-30 DIAGNOSIS — F0390 Unspecified dementia without behavioral disturbance: Secondary | ICD-10-CM | POA: Diagnosis not present

## 2019-10-30 DIAGNOSIS — D62 Acute posthemorrhagic anemia: Secondary | ICD-10-CM | POA: Diagnosis not present

## 2019-10-30 DIAGNOSIS — Z9181 History of falling: Secondary | ICD-10-CM | POA: Diagnosis not present

## 2019-10-30 DIAGNOSIS — G9341 Metabolic encephalopathy: Secondary | ICD-10-CM | POA: Diagnosis not present

## 2019-10-30 NOTE — Plan of Care (Signed)

## 2019-10-30 NOTE — Progress Notes (Signed)
Physical Therapy Treatment Patient Details Name: Holly Salazar MRN: [id number] DOB: 1936/02/15 Today's Date: 10/30/2019    History of Present Illness Pt s/p L hip fx with IM nail repair.    PT Comments    POD # 5 Assisted OOB to [facility] then to amb.  General bed mobility comments: Max Assist + 1 also using bed pad to complete scooting to EOB.  General transfer comment: cues for LE management and use of UEs to self assist.  Physical assist to bring wt up and fwd and to balance in standing.  Severe posterior lean and increased fear/anxiety. General Gait Details: severe posterior lean and VERY unsteady/choppy gait.  Required assist to correct posterior lean and correct walker distance. Positioned in recliner to comfort and applied ICE.   Follow Up Recommendations  SNF     Equipment Recommendations  None recommended by PT    Recommendations for Other Services       Precautions / Restrictions Precautions Precautions: Fall Restrictions Weight Bearing Restrictions: No LLE Weight Bearing: Weight bearing as tolerated    Mobility  Bed Mobility Overal bed mobility: Needs Assistance Bed Mobility: Supine to Sit     Supine to sit: Max assist     General bed mobility comments: Max Assist + 1 also using bed pad to complete scooting to EOB  Transfers Overall transfer level: Needs assistance Equipment used: Rolling walker (2 wheeled) Transfers: Sit to/from Stand Sit to Stand: Max assist         General transfer comment: cues for LE management and use of UEs to self assist.  Physical assist to bring wt up and fwd and to balance in standing.  Severe posterior lean and increased fear/anxiety.  Ambulation/Gait Ambulation/Gait assistance: Mod assist Gait Distance (Feet): 16 Feet Assistive device: Rolling walker (2 wheeled) Gait Pattern/deviations: Step-to pattern;Decreased step length - right;Decreased step length - left;Shuffle;Trunk flexed Gait velocity: decreased   General Gait  Details: severe posterior lean and VERY unsteady/choppy gait.  Required assist to correct posterior lean and correct walker distance.   Stairs             Wheelchair Mobility    Modified Rankin (Stroke Patients Only)       Balance                                            Cognition Arousal/Alertness: Awake/alert Behavior During Therapy: WFL for tasks assessed/performed Overall Cognitive Status: No family/caregiver present to determine baseline cognitive functioning                                 General Comments: AxO x 1 following simple functional commands      Exercises      General Comments        Pertinent Vitals/Pain Pain Assessment: Faces Faces Pain Scale: Hurts little more Pain Location: L hip Pain Descriptors / Indicators: Sore;Discomfort;Grimacing Pain Intervention(s): Monitored during session;Repositioned;Ice applied    Home Living                      Prior Function            PT Goals (current goals can now be found in the care plan section) Progress towards PT goals: Progressing toward goals    Frequency    Min  3X/week      PT Plan Current plan remains appropriate    Co-evaluation              AM-PAC PT "6 Clicks" Mobility   Outcome Measure  Help needed turning from your back to your side while in a flat bed without using bedrails?: A Lot Help needed moving from lying on your back to sitting on the side of a flat bed without using bedrails?: A Lot Help needed moving to and from a bed to a chair (including a wheelchair)?: A Lot Help needed standing up from a chair using your arms (e.g., wheelchair or bedside chair)?: A Lot Help needed to walk in hospital room?: A Lot Help needed climbing 3-5 steps with a railing? : Total 6 Click Score: 11    End of Session   Activity Tolerance: Patient limited by fatigue;Patient tolerated treatment well Patient left: in chair;with call  bell/phone within reach;with bed alarm set Nurse Communication: Mobility status PT Visit Diagnosis: Difficulty in walking, not elsewhere classified (R26.2);Muscle weakness (generalized) (M62.81);Unsteadiness on feet (R26.81)     Time: 1025-1050 PT Time Calculation (min) (ACUTE ONLY): 25 min  Charges:  $Gait Training: 8-22 mins $Therapeutic Activity: 8-22 mins                     Rica Koyanagi  PTA Acute  Rehabilitation Services Pager      [phone] Office      [phone]

## 2019-10-30 NOTE — Discharge Summary (Addendum)
Physician Discharge Summary  Holly Salazar [id number] DOB: 10/25/1935 DOA: 10/24/2019  PCP: Kathyrn Lass, MD  Admit date: 10/24/2019 Discharge date: 10/30/2019  Admitted From: home Disposition:  SNF  Recommendations for Outpatient Follow-up:  1. Follow up with PCP in 1-2 weeks 2. Follow up with Dr. Lorin Mercy in 1-2 weeks 3. Follow up with Dr. Alinda Money in 1-2 weeks  Home Health: none  Equipment/Devices: none   Discharge Condition: stable CODE STATUS: Full code Diet recommendation: regular  HPI: Per admitting MD, Holly Salazar is a 84 y.o. female with medical history significant of HTN, osteoporosis. Pt normally walks with cane at baseline.  Doesn't walk up stairs anymore.  No CP nor SOB with ambulation. Pt presents to ED after mechanical fall, slid out of chair at home.  Landed on L hip.  Severe L hip pain and inability to bear weight post fall.  EMS transported to ED. ED Course: Impacted L femoral neck fx.  Hospital Course / Discharge diagnoses: Principal Problem Closed displaced fracture of left femoral neck -Dr. Lorin Mercy with orthopedic surgery consulted, she underwent operative repair on 3/3, PT recommended SNF. DVT prophylaxis per ortho with full dose aspirin for a month. I will add PPI as well.  Active Problems Acute metabolic encephalopathy /in-hospital delirium -This appears to be resolved, appears postop -Discussed with the niece over the phone, patient does not appear to have previous memory problems or dementia, however the niece tells me that she has exhibited intermittent paranoid ideation in the past accusing her neighbors of cutting of her phone, placing a snake near her door at 1 point.  She also tells me that the patient has been more frail in the last several months.   Fever 3/5-3/6 overnight -Patient had recorded fever of 100.8, completely asymptomatic. This is likely postopand resolved. Essential hypertension -Continue lisinopril, blood pressure stable Acute urinary  retention -unclear etiology,Urology consulted, had to have Foley catheter placed intraoperatively. She was given a voiding trial on 3/6, failed and Foley had to be reinserted. Would recommend discharge with Foley and outpatient follow up with Dr. Alinda Money in 2 weeks Acute blood loss anemia -Expected postop,Hb stable, did not need transfusion. Severe protein calorie malnutrition - continue supplements  Discharge Instructions   Allergies as of 10/30/2019   No Known Allergies     Medication List    TAKE these medications   aspirin 325 MG EC tablet Take 1 tablet (325 mg total) by mouth daily with breakfast.   HYDROcodone-acetaminophen 5-325 MG tablet Commonly known as: NORCO/VICODIN Take 1-2 tablets by mouth every 6 (six) hours as needed for moderate pain.   lisinopril 20 MG tablet Commonly known as: ZESTRIL Take 20 mg by mouth daily.   multivitamin with minerals Tabs tablet Take 1 tablet by mouth daily.   naproxen sodium 220 MG tablet Commonly known as: ALEVE Take 220 mg by mouth daily as needed (pain).   pantoprazole 40 MG tablet Commonly known as: Protonix Take 1 tablet (40 mg total) by mouth daily.       Contact information for follow-up providers    [name], MD. Schedule an appointment as soon as possible for a visit in 2 week(s).   Specialty: Orthopedic Surgery Contact information: [place] [address] [phone]        Raynelle Bring, MD. Schedule an appointment as soon as possible for a visit in 2 week(s).   Specialty: Urology Contact information: [place] [address] [phone]  [facility]. Schedule an appointment as soon as possible for a visit in 2 week(s).   Why: Have patient call to confirm appointment. Contact information: [place] [phone]           Contact information for after-discharge care    Destination    [facility] .   Service: Skilled Nursing Contact information: [place] [phone]                  Consultations:  Orthopedic surgery   Procedures/Studies:  im nail  DG Chest 1 View  Result Date: 10/24/2019 CLINICAL DATA:  Hip fracture.  Fall EXAM: CHEST  1 VIEW COMPARISON:  None. FINDINGS: Pulmonary hyperinflation. Lungs clear without infiltrate or effusion. Negative for heart failure. IMPRESSION: COPD without acute abnormality. Electronically Signed   By: Franchot Gallo M.D.   On: 10/24/2019 19:09   DG C-Arm 1-60 Min-No Report  Result Date: 10/25/2019 Fluoroscopy was utilized by the requesting physician.  No radiographic interpretation.   DG Hip Unilat W or Wo Pelvis 2-3 Views Left  Result Date: 10/24/2019 CLINICAL DATA:  Fall EXAM: DG HIP (WITH OR WITHOUT PELVIS) 2-3V LEFT COMPARISON:  None. FINDINGS: Impacted fracture left femoral neck. Left hip joint otherwise normal Surgical repair of right hip fracture with screw and rod. No other pelvic lesion IMPRESSION: Impacted fracture left femoral neck. Electronically Signed   By: Franchot Gallo M.D.   On: 10/24/2019 19:08   DG Femur Min 2 Views Left  Result Date: 10/24/2019 CLINICAL DATA:  Fall EXAM: LEFT FEMUR 2 VIEWS COMPARISON:  None. FINDINGS: Fracture left femoral neck with impaction. No other fracture of the femur Advanced degenerative change in the knee joint with joint space narrowing and chondrocalcinosis IMPRESSION: Left femoral neck impacted fracture. Electronically Signed   By: Franchot Gallo M.D.   On: 10/24/2019 19:07      Subjective: - no chest pain, shortness of breath, no abdominal pain, nausea or vomiting.   Discharge Exam: BP (!) 126/50 (BP Location: Right Arm)   Pulse 93   Temp (!) 97.4 F (36.3 C) (Oral)   Resp 16   Ht 5\' 7"  (1.702 m)   Wt 59.4 kg   SpO2 95%   BMI 20.51 kg/m   General: Pt is alert, awake, not in acute distress Cardiovascular:  RRR, S1/S2 +, no rubs, no gallops Respiratory: CTA bilaterally, no wheezing, no rhonchi Abdominal: Soft, NT, ND, bowel sounds + Extremities: no edema, no cyanosis    The results of significant diagnostics from this hospitalization (including imaging, microbiology, ancillary and laboratory) are listed below for reference.     Microbiology: Recent Results (from the past 240 hour(s))  Respiratory Panel by RT PCR (Flu A&B, Covid) - Nasopharyngeal Swab     Status: None   Collection Time: 10/24/19  7:11 PM   Specimen: Nasopharyngeal Swab  Result Value Ref Range Status   SARS Coronavirus 2 by RT PCR NEGATIVE NEGATIVE Final    Comment: (NOTE) SARS-CoV-2 target nucleic acids are NOT DETECTED. The SARS-CoV-2 RNA is generally detectable in upper respiratoy specimens during the acute phase of infection. The lowest concentration of SARS-CoV-2 viral copies this assay can detect is 131 copies/mL. A negative result does not preclude SARS-Cov-2 infection and should not be used as the sole basis for treatment or other patient management decisions. A negative result may occur with  improper specimen collection/handling, submission of  specimen other than nasopharyngeal swab, presence of viral mutation(s) within the areas targeted by this assay, and inadequate number of viral copies (<131 copies/mL). A negative result must be combined with clinical observations, patient history, and epidemiological information. The expected result is Negative. Fact Sheet for Patients:  PinkCheek.be Fact Sheet for Healthcare Providers:  GravelBags.it This test is not yet ap proved or cleared by the [place] FDA and  has been authorized for detection and/or diagnosis of SARS-CoV-2 by FDA under an Emergency Use Authorization (EUA). This EUA will remain  in effect (meaning this test can be used) for the duration of the COVID-19 declaration under Section  564(b)(1) of the Act, 21 U.S.C. section 360bbb-3(b)(1), unless the authorization is terminated or revoked sooner.    Influenza A by PCR NEGATIVE NEGATIVE Final   Influenza B by PCR NEGATIVE NEGATIVE Final    Comment: (NOTE) The Xpert Xpress SARS-CoV-2/FLU/RSV assay is intended as an aid in  the diagnosis of influenza from Nasopharyngeal swab specimens and  should not be used as a sole basis for treatment. Nasal washings and  aspirates are unacceptable for Xpert Xpress SARS-CoV-2/FLU/RSV  testing. Fact Sheet for Patients: PinkCheek.be Fact Sheet for Healthcare Providers: GravelBags.it This test is not yet approved or cleared by the [place] FDA and  has been authorized for detection and/or diagnosis of SARS-CoV-2 by  FDA under an Emergency Use Authorization (EUA). This EUA will remain  in effect (meaning this test can be used) for the duration of the  Covid-19 declaration under Section 564(b)(1) of the Act, 21  U.S.C. section 360bbb-3(b)(1), unless the authorization is  terminated or revoked. Performed at [facility], [place] [address] [place], [place] [address]   Surgical PCR screen     Status: None   Collection Time: 10/25/19  5:12 AM   Specimen: Nasal Mucosa; Nasal Swab  Result Value Ref Range Status   MRSA, PCR NEGATIVE NEGATIVE Final   Staphylococcus aureus NEGATIVE NEGATIVE Final    Comment: (NOTE) The Xpert SA Assay (FDA approved for NASAL specimens in patients 71 years of age and older), is one component of a comprehensive surveillance program. It is not intended to diagnose infection nor to guide or monitor treatment. Performed at [facility], [place] [address], [place], [place] [address]   Urine Culture     Status: None   Collection Time: 10/25/19  9:40 PM   Specimen: Urine, Catheterized  Result Value Ref Range Status   Specimen Description   Final    URINE,  CATHETERIZED Performed at [place] [address], [place], [place] [address]    Special Requests   Final    Immunocompromised Performed at [facility], [place] [address], [place], [place] [address]    Culture   Final    NO GROWTH Performed at [facility], [place] [address], [place],  [address]    Report Status 10/26/2019 FINAL  Final  SARS CORONAVIRUS 2 (TAT 6-24 HRS) Nasopharyngeal Nasopharyngeal Swab     Status: None   Collection Time: 10/27/19  3:06 PM   Specimen: Nasopharyngeal Swab  Result Value Ref Range Status   SARS Coronavirus 2 NEGATIVE NEGATIVE Final    Comment: (NOTE) SARS-CoV-2 target nucleic acids are NOT DETECTED. The SARS-CoV-2 RNA is generally detectable in upper and lower respiratory specimens during the acute phase of infection. Negative results do not preclude SARS-CoV-2 infection, do not rule out co-infections with other pathogens, and should  not be used as the sole basis for treatment or other patient management decisions. Negative results must be combined with clinical observations, patient history, and epidemiological information. The expected result is Negative. Fact Sheet for Patients: SugarRoll.be Fact Sheet for Healthcare Providers: https://www.woods-mathews.com/ This test is not yet approved or cleared by the [place] FDA and  has been authorized for detection and/or diagnosis of SARS-CoV-2 by FDA under an Emergency Use Authorization (EUA). This EUA will remain  in effect (meaning this test can be used) for the duration of the COVID-19 declaration under Section 56 4(b)(1) of the Act, 21 U.S.C. section 360bbb-3(b)(1), unless the authorization is terminated or revoked sooner. Performed at [facility], [place] [address], [place], [place] [address]      Labs: Basic Metabolic Panel: Recent Labs  Lab 10/24/19 1800 10/26/19 0348 10/27/19 0521  10/28/19 0411  NA 138 136 136 134*  K 4.1 4.3 4.0 4.1  CL 100 103 103 102  CO2 29 23 26 26   GLUCOSE 176* 165* 114* 138*  BUN 18 17 20  26*  CREATININE 0.84 0.65 0.71 0.65  CALCIUM 8.9 8.5* 8.6* 8.2*   Liver Function Tests: Recent Labs  Lab 10/26/19 0348  AST 25  ALT 21  ALKPHOS 46  BILITOT 0.8  PROT 6.1*  ALBUMIN 3.4*   CBC: Recent Labs  Lab 10/24/19 1800 10/26/19 0348 10/27/19 0521 10/28/19 0411 10/29/19 0425  WBC 5.8 9.5 7.6 7.3  --   NEUTROABS 4.5  --   --   --   --   HGB 12.1 10.4* 9.7* 8.4* 8.6*  HCT 37.6 32.3* 30.1* 26.5* 27.1*  MCV 95.7 95.0 95.0 95.0  --   PLT 217 164 169 168  --    CBG: No results for input(s): GLUCAP in the last 168 hours. Hgb A1c No results for input(s): HGBA1C in the last 72 hours. Lipid Profile No results for input(s): CHOL, HDL, LDLCALC, TRIG, CHOLHDL, LDLDIRECT in the last 72 hours. Thyroid function studies No results for input(s): TSH, T4TOTAL, T3FREE, THYROIDAB in the last 72 hours.  Invalid input(s): FREET3 Urinalysis    Component Value Date/Time   COLORURINE YELLOW 10/26/2019 1807   APPEARANCEUR CLEAR 10/26/2019 1807   LABSPEC 1.014 10/26/2019 1807   PHURINE 7.0 10/26/2019 1807   GLUCOSEU NEGATIVE 10/26/2019 1807   HGBUR NEGATIVE 10/26/2019 1807   BILIRUBINUR NEGATIVE 10/26/2019 1807   KETONESUR NEGATIVE 10/26/2019 1807   PROTEINUR NEGATIVE 10/26/2019 1807   NITRITE NEGATIVE 10/26/2019 1807   LEUKOCYTESUR NEGATIVE 10/26/2019 1807    FURTHER DISCHARGE INSTRUCTIONS:   Get Medicines reviewed and adjusted: Please take all your medications with you for your next visit with your Primary MD   Laboratory/radiological data: Please request your Primary MD to go over all hospital tests and procedure/radiological results at the follow up, please ask your Primary MD to get all Hospital records sent to his/her office.   In some cases, they will be blood work, cultures and biopsy results pending at the time of your discharge.  Please request that your primary care M.D. goes through all the records of your hospital data and follows up on these results.   Also Note the following: If you experience worsening of your admission symptoms, develop shortness of breath, life threatening emergency, suicidal or homicidal thoughts you must seek medical attention immediately by calling 911 or calling your MD immediately  if symptoms less severe.   You must read complete instructions/literature along with all the possible adverse reactions/side  effects for all the Medicines you take and that have been prescribed to you. Take any new Medicines after you have completely understood and accpet all the possible adverse reactions/side effects.    Do not drive when taking Pain medications or sleeping medications (Benzodaizepines)   Do not take more than prescribed Pain, Sleep and Anxiety Medications. It is not advisable to combine anxiety,sleep and pain medications without talking with your primary care practitioner   Special Instructions: If you have smoked or chewed Tobacco  in the last 2 yrs please stop smoking, stop any regular Alcohol  and or any Recreational drug use.   Wear Seat belts while driving.   Please note: You were cared for by a hospitalist during your hospital stay. Once you are discharged, your primary care physician will handle any further medical issues. Please note that NO REFILLS for any discharge medications will be authorized once you are discharged, as it is imperative that you return to your primary care physician (or establish a relationship with a primary care physician if you do not have one) for your post hospital discharge needs so that they can reassess your need for medications and monitor your lab values.  Time coordinating discharge: 35 minutes  SIGNED:  Marzetta Board, MD, PhD 10/30/2019, 8:46 AM

## 2019-10-30 NOTE — TOC Transition Note (Signed)
Transition of Care [facility]) - CM/SW Discharge Note   Patient Details  Name: [name] MRN: [id number] Date of Birth: 05/03/1936  Transition of Care [facility]) CM/SW Contact:  Lennart Pall, LCSW Phone Number: 10/30/2019, 10:04 AM   Clinical Narrative:    Pt to d/c today to [facility] in room # [phone]. Alerted RN and she will call report to [phone]. PTAR to pick up at 10:30 am.     Final next level of care: [place] Barriers to Discharge: Barriers Resolved   Patient Goals and CMS Choice Patient states their goals for this hospitalization and ongoing recovery are:: Pt currently confused and declines any discussion. CMS Medicare.gov Compare Post Acute Care list provided to:: Patient Represenative (must comment) Choice offered to / list presented to : [place] / Guardian  Discharge Placement PASRR number recieved: 10/26/19            Patient chooses bed at: [facility] Patient to be transferred to facility by: [place] Name of family member notified: neice/ POA, Nelson Chimes Patient and family notified of of transfer: 10/30/19  Discharge Plan and Services In-house Referral: Clinical Social Work Discharge Planning Services: NA Post Acute Care Choice: [place]          DME Arranged: N/A DME Agency: NA       HH Arranged: NA HH Agency: NA        Social Determinants of Health (SDOH) Interventions     Readmission Risk Interventions No flowsheet data found.

## 2019-10-31 DIAGNOSIS — D62 Acute posthemorrhagic anemia: Secondary | ICD-10-CM | POA: Diagnosis not present

## 2019-10-31 DIAGNOSIS — R262 Difficulty in walking, not elsewhere classified: Secondary | ICD-10-CM | POA: Diagnosis not present

## 2019-10-31 DIAGNOSIS — I10 Essential (primary) hypertension: Secondary | ICD-10-CM | POA: Diagnosis not present

## 2019-10-31 DIAGNOSIS — R338 Other retention of urine: Secondary | ICD-10-CM | POA: Diagnosis not present

## 2019-10-31 DIAGNOSIS — F39 Unspecified mood [affective] disorder: Secondary | ICD-10-CM | POA: Diagnosis not present

## 2019-10-31 DIAGNOSIS — G9341 Metabolic encephalopathy: Secondary | ICD-10-CM | POA: Diagnosis not present

## 2019-10-31 DIAGNOSIS — E440 Moderate protein-calorie malnutrition: Secondary | ICD-10-CM | POA: Diagnosis not present

## 2019-10-31 DIAGNOSIS — F22 Delusional disorders: Secondary | ICD-10-CM | POA: Diagnosis not present

## 2019-10-31 DIAGNOSIS — S7222XA Displaced subtrochanteric fracture of left femur, initial encounter for closed fracture: Secondary | ICD-10-CM | POA: Diagnosis not present

## 2019-10-31 DIAGNOSIS — M810 Age-related osteoporosis without current pathological fracture: Secondary | ICD-10-CM | POA: Diagnosis not present

## 2019-11-01 DIAGNOSIS — G9341 Metabolic encephalopathy: Secondary | ICD-10-CM | POA: Diagnosis not present

## 2019-11-01 DIAGNOSIS — F39 Unspecified mood [affective] disorder: Secondary | ICD-10-CM | POA: Diagnosis not present

## 2019-11-01 DIAGNOSIS — F22 Delusional disorders: Secondary | ICD-10-CM | POA: Diagnosis not present

## 2019-11-02 DIAGNOSIS — S72142S Displaced intertrochanteric fracture of left femur, sequela: Secondary | ICD-10-CM | POA: Diagnosis not present

## 2019-11-02 DIAGNOSIS — F064 Anxiety disorder due to known physiological condition: Secondary | ICD-10-CM | POA: Diagnosis not present

## 2019-11-02 DIAGNOSIS — F4323 Adjustment disorder with mixed anxiety and depressed mood: Secondary | ICD-10-CM | POA: Diagnosis not present

## 2019-11-02 DIAGNOSIS — Z9181 History of falling: Secondary | ICD-10-CM | POA: Diagnosis not present

## 2019-11-02 DIAGNOSIS — R630 Anorexia: Secondary | ICD-10-CM | POA: Diagnosis not present

## 2019-11-02 DIAGNOSIS — E43 Unspecified severe protein-calorie malnutrition: Secondary | ICD-10-CM | POA: Diagnosis not present

## 2019-11-07 DIAGNOSIS — F4323 Adjustment disorder with mixed anxiety and depressed mood: Secondary | ICD-10-CM | POA: Diagnosis not present

## 2019-11-07 DIAGNOSIS — R630 Anorexia: Secondary | ICD-10-CM | POA: Diagnosis not present

## 2019-11-07 DIAGNOSIS — Z9181 History of falling: Secondary | ICD-10-CM | POA: Diagnosis not present

## 2019-11-07 DIAGNOSIS — S72142S Displaced intertrochanteric fracture of left femur, sequela: Secondary | ICD-10-CM | POA: Diagnosis not present

## 2019-11-07 DIAGNOSIS — E43 Unspecified severe protein-calorie malnutrition: Secondary | ICD-10-CM | POA: Diagnosis not present

## 2019-11-07 DIAGNOSIS — F064 Anxiety disorder due to known physiological condition: Secondary | ICD-10-CM | POA: Diagnosis not present

## 2019-11-09 ENCOUNTER — Other Ambulatory Visit: Payer: Self-pay | Admitting: *Deleted

## 2019-11-09 DIAGNOSIS — F39 Unspecified mood [affective] disorder: Secondary | ICD-10-CM | POA: Diagnosis not present

## 2019-11-09 DIAGNOSIS — K5901 Slow transit constipation: Secondary | ICD-10-CM | POA: Diagnosis not present

## 2019-11-09 DIAGNOSIS — S72002D Fracture of unspecified part of neck of left femur, subsequent encounter for closed fracture with routine healing: Secondary | ICD-10-CM | POA: Diagnosis not present

## 2019-11-09 DIAGNOSIS — R338 Other retention of urine: Secondary | ICD-10-CM | POA: Diagnosis not present

## 2019-11-09 NOTE — Patient Outreach (Signed)
Screened for potential [facility] Care Management needs as a benefit of  [employer].  Member is currently receiving skilled therapy at [facility].  Will follow for transition plans and for potential [facility] Care Management needs.    [name], MSN-Ed, RN,BSN [place] Acute Care Coordinator [phone] [facility]) [phone]  (Toll free office)

## 2019-11-14 ENCOUNTER — Other Ambulatory Visit: Payer: Self-pay | Admitting: *Deleted

## 2019-11-14 DIAGNOSIS — R338 Other retention of urine: Secondary | ICD-10-CM | POA: Diagnosis not present

## 2019-11-14 DIAGNOSIS — R262 Difficulty in walking, not elsewhere classified: Secondary | ICD-10-CM | POA: Diagnosis not present

## 2019-11-14 DIAGNOSIS — F3489 Other specified persistent mood disorders: Secondary | ICD-10-CM | POA: Diagnosis not present

## 2019-11-14 NOTE — Patient Outreach (Signed)
[place] Coordinator follow up  Holly Salazar is currently receiving skilled therapy at [facility].   Telephone call to Nelson Chimes (niece/HCPOA) [phone]. Patient identifiers confirmed.   Olin Hauser states she has been informed by facility that member is not progressing with therapy. Pam states she is aware of recommendations of [name] needing 24/7. Pam states she is not convinced member is not progressing because she has not been able to see [name] since she has been on the quarantine unit. Pam states she has previously declined therapy efforts to do a face time with her. Pam states she is hopeful member will be accepted in ALF. Pam continues to reiterate that her goal is for member to return to her condo with possible hired caregivers part time.   Writer attempted to discuss other alternatives to ALF or returning home due to [name]'s  functional status. Pam states " I want to wait for more information from the facility." Pam reports she wants member to dc before she gets into her co- pay days which appears to be the end of this week.   Discussed that writer will contact her at  later time. Provided Probation officer and [place] Management contact information via email.  Will continue to follow for transition plans and for potential [facility] needs while member resides in SNF.      [name], MSN-Ed, RN,BSN [place] Acute Care Coordinator [phone] [facility] [phone]  (Toll free office)

## 2019-11-16 ENCOUNTER — Ambulatory Visit: Payer: Self-pay

## 2019-11-16 ENCOUNTER — Other Ambulatory Visit: Payer: Self-pay

## 2019-11-16 ENCOUNTER — Encounter: Payer: Self-pay | Admitting: Surgery

## 2019-11-16 ENCOUNTER — Ambulatory Visit (INDEPENDENT_AMBULATORY_CARE_PROVIDER_SITE_OTHER): Payer: Medicare Other | Admitting: Surgery

## 2019-11-16 DIAGNOSIS — S72002A Fracture of unspecified part of neck of left femur, initial encounter for closed fracture: Secondary | ICD-10-CM

## 2019-11-16 DIAGNOSIS — L89611 Pressure ulcer of right heel, stage 1: Secondary | ICD-10-CM | POA: Diagnosis not present

## 2019-11-16 DIAGNOSIS — L89620 Pressure ulcer of left heel, unstageable: Secondary | ICD-10-CM | POA: Diagnosis not present

## 2019-11-16 DIAGNOSIS — S7222XA Displaced subtrochanteric fracture of left femur, initial encounter for closed fracture: Secondary | ICD-10-CM | POA: Diagnosis not present

## 2019-11-16 NOTE — Progress Notes (Signed)
   Post-Op Visit Note   Patient: Holly Salazar           Date of Birth: 04-28-1936           MRN: [id number] Visit Date: 11/16/2019 PCP: Kathyrn Lass, MD   Assessment & Plan:  Chief Complaint:  Chief Complaint  Patient presents with  . Left Leg - Routine Post Op   Visit Diagnoses:  1. Closed left hip fracture, initial encounter [facility])     Plan: Patient will follow-up with Dr. Lorin Mercy in a few weeks for recheck and repeat x-ray.  Return sooner if needed.  Follow-Up Instructions: Return in about 3 weeks (around 12/07/2019) for WITH DR Clio RECHECK LEFT HIP.   Orders:  Orders Placed This Encounter  Procedures  . XR FEMUR MIN 2 VIEWS LEFT   No orders of the defined types were placed in this encounter.   Imaging: No results found.  PMFS History: Patient Active Problem List   Diagnosis Date Noted  . Protein-calorie malnutrition, severe 10/26/2019  . Closed intertrochanteric fracture of hip, left, initial encounter ([place]) 10/25/2019  . HTN (hypertension) 10/24/2019  . Radius and ulna distal fracture 01/16/2016   Past Medical History:  Diagnosis Date  . Arthritis    knee and back  . Cancer ([place])    melanoma on back  . Hypertension   . Osteoporosis     Family History  Problem Relation Age of Onset  . Heart disease Mother   . Cancer Father     Past Surgical History:  Procedure Laterality Date  . APPENDECTOMY    . CARPAL TUNNEL RELEASE Right   . COLONOSCOPY    . FEMUR IM NAIL Left 10/25/2019   Procedure: INTRAMEDULLARY (IM) NAIL FEMORAL;  Surgeon: [name], MD;  Location: [facility];  Service: Orthopedics;  Laterality: Left;  . HIP FRACTURE SURGERY Right   . OPEN REDUCTION INTERNAL FIXATION (ORIF) DISTAL RADIAL FRACTURE Left 01/16/2016   Procedure: OPEN REDUCTION INTERNAL FIXATION (ORIF) LEFT  DISTAL RADIUS FRACTURE WITH REPAIR RECONSTRUCTION AS NEEDED ;  Surgeon: Roseanne Kaufman, MD;  Location: [place];  Service: Orthopedics;  Laterality: Left;  . TONSILLECTOMY      Social History   Occupational History  . Not on file  Tobacco Use  . Smoking status: Never Smoker  . Smokeless tobacco: Never Used  Substance and Sexual Activity  . Alcohol use: No  . Drug use: No  . Sexual activity: Not on file   Exam Wound looks good.  Staples removed and Steri-Strips applied.  No drainage or signs of infection.

## 2019-11-17 DIAGNOSIS — F3489 Other specified persistent mood disorders: Secondary | ICD-10-CM | POA: Diagnosis not present

## 2019-11-17 DIAGNOSIS — S72002D Fracture of unspecified part of neck of left femur, subsequent encounter for closed fracture with routine healing: Secondary | ICD-10-CM | POA: Diagnosis not present

## 2019-11-17 DIAGNOSIS — L89620 Pressure ulcer of left heel, unstageable: Secondary | ICD-10-CM | POA: Diagnosis not present

## 2019-11-17 DIAGNOSIS — R338 Other retention of urine: Secondary | ICD-10-CM | POA: Diagnosis not present

## 2019-11-21 DIAGNOSIS — R338 Other retention of urine: Secondary | ICD-10-CM | POA: Diagnosis not present

## 2019-11-21 DIAGNOSIS — Z9181 History of falling: Secondary | ICD-10-CM | POA: Diagnosis not present

## 2019-11-21 DIAGNOSIS — I10 Essential (primary) hypertension: Secondary | ICD-10-CM | POA: Diagnosis not present

## 2019-11-21 DIAGNOSIS — L89611 Pressure ulcer of right heel, stage 1: Secondary | ICD-10-CM | POA: Diagnosis not present

## 2019-11-21 DIAGNOSIS — E440 Moderate protein-calorie malnutrition: Secondary | ICD-10-CM | POA: Diagnosis not present

## 2019-11-21 DIAGNOSIS — S72142S Displaced intertrochanteric fracture of left femur, sequela: Secondary | ICD-10-CM | POA: Diagnosis not present

## 2019-11-21 DIAGNOSIS — F39 Unspecified mood [affective] disorder: Secondary | ICD-10-CM | POA: Diagnosis not present

## 2019-11-21 DIAGNOSIS — D62 Acute posthemorrhagic anemia: Secondary | ICD-10-CM | POA: Diagnosis not present

## 2019-11-21 DIAGNOSIS — F22 Delusional disorders: Secondary | ICD-10-CM | POA: Diagnosis not present

## 2019-11-21 DIAGNOSIS — R262 Difficulty in walking, not elsewhere classified: Secondary | ICD-10-CM | POA: Diagnosis not present

## 2019-11-21 DIAGNOSIS — S72002D Fracture of unspecified part of neck of left femur, subsequent encounter for closed fracture with routine healing: Secondary | ICD-10-CM | POA: Diagnosis not present

## 2019-11-21 DIAGNOSIS — F4323 Adjustment disorder with mixed anxiety and depressed mood: Secondary | ICD-10-CM | POA: Diagnosis not present

## 2019-11-21 DIAGNOSIS — E43 Unspecified severe protein-calorie malnutrition: Secondary | ICD-10-CM | POA: Diagnosis not present

## 2019-11-21 DIAGNOSIS — F064 Anxiety disorder due to known physiological condition: Secondary | ICD-10-CM | POA: Diagnosis not present

## 2019-11-22 ENCOUNTER — Other Ambulatory Visit: Payer: Self-pay | Admitting: *Deleted

## 2019-11-22 NOTE — Patient Outreach (Signed)
[facility] Post- Acute Care Coordinator follow- up.  Ms. Hagley is currently receiving skilled therapy at [facility].  Telephone call made to Nelson Chimes (niece) [phone]. Patient identifiers confirmed. Pam affirms member will not be accepted in ALF due to unstageable heel wound.  Disposition plans are now pending. However, Pam endorses member cannot return home at this time.   Writer explained purpose of call in discussing transition needs and potential [facility] services. Pam states she is not sure how Probation officer can assist.    Will continue to follow while residing in SNF.    [name], MSN-Ed, RN,BSN [place] Acute Care Coordinator [phone] [facility]) [phone]  (Toll free office)

## 2019-11-23 ENCOUNTER — Other Ambulatory Visit: Payer: Self-pay

## 2019-11-23 ENCOUNTER — Encounter (HOSPITAL_COMMUNITY): Payer: Self-pay | Admitting: Radiology

## 2019-11-23 ENCOUNTER — Emergency Department (HOSPITAL_COMMUNITY)
Admission: EM | Admit: 2019-11-23 | Discharge: 2019-11-23 | Disposition: A | Discharge status: Home / Self Care | Payer: Medicare Other | Attending: Emergency Medicine | Admitting: Emergency Medicine

## 2019-11-23 ENCOUNTER — Emergency Department (HOSPITAL_COMMUNITY): Payer: Medicare Other

## 2019-11-23 DIAGNOSIS — R918 Other nonspecific abnormal finding of lung field: Secondary | ICD-10-CM | POA: Diagnosis not present

## 2019-11-23 DIAGNOSIS — R4182 Altered mental status, unspecified: Secondary | ICD-10-CM | POA: Diagnosis not present

## 2019-11-23 DIAGNOSIS — Z85828 Personal history of other malignant neoplasm of skin: Secondary | ICD-10-CM | POA: Insufficient documentation

## 2019-11-23 DIAGNOSIS — Z79899 Other long term (current) drug therapy: Secondary | ICD-10-CM | POA: Diagnosis not present

## 2019-11-23 DIAGNOSIS — L89611 Pressure ulcer of right heel, stage 1: Secondary | ICD-10-CM | POA: Diagnosis not present

## 2019-11-23 DIAGNOSIS — L89601 Pressure ulcer of unspecified heel, stage 1: Secondary | ICD-10-CM

## 2019-11-23 DIAGNOSIS — I10 Essential (primary) hypertension: Secondary | ICD-10-CM | POA: Diagnosis not present

## 2019-11-23 DIAGNOSIS — Z7982 Long term (current) use of aspirin: Secondary | ICD-10-CM | POA: Diagnosis not present

## 2019-11-23 DIAGNOSIS — F0390 Unspecified dementia without behavioral disturbance: Secondary | ICD-10-CM | POA: Insufficient documentation

## 2019-11-23 DIAGNOSIS — J90 Pleural effusion, not elsewhere classified: Secondary | ICD-10-CM

## 2019-11-23 DIAGNOSIS — T83511A Infection and inflammatory reaction due to indwelling urethral catheter, initial encounter: Secondary | ICD-10-CM | POA: Diagnosis not present

## 2019-11-23 DIAGNOSIS — N390 Urinary tract infection, site not specified: Secondary | ICD-10-CM | POA: Insufficient documentation

## 2019-11-23 DIAGNOSIS — L89621 Pressure ulcer of left heel, stage 1: Secondary | ICD-10-CM | POA: Diagnosis not present

## 2019-11-23 DIAGNOSIS — I959 Hypotension, unspecified: Secondary | ICD-10-CM | POA: Diagnosis present

## 2019-11-23 LAB — URINALYSIS, ROUTINE W REFLEX MICROSCOPIC
Bilirubin Urine: NEGATIVE
Glucose, UA: NEGATIVE mg/dL
Ketones, ur: 5 mg/dL — AB
Nitrite: NEGATIVE
Protein, ur: 100 mg/dL — AB
RBC / HPF: >50 RBC/hpf — ABNORMAL HIGH (ref 0–5)
Specific Gravity, Urine: 1.019 (ref 1.005–1.030)
WBC, UA: >50 WBC/hpf — ABNORMAL HIGH (ref 0–5)
pH: 5 (ref 5.0–8.0)

## 2019-11-23 LAB — CBC WITH DIFFERENTIAL/PLATELET
Abs Immature Granulocytes: 0.03 10*3/uL (ref 0.00–0.07)
Basophils Absolute: 0 10*3/uL (ref 0.0–0.1)
Basophils Relative: 0 %
Eosinophils Absolute: 0 10*3/uL (ref 0.0–0.5)
Eosinophils Relative: 0 %
HCT: 32.7 % — ABNORMAL LOW (ref 36.0–46.0)
Hemoglobin: 10.3 g/dL — ABNORMAL LOW (ref 12.0–15.0)
Immature Granulocytes: 0 %
Lymphocytes Relative: 12 %
Lymphs Abs: 0.9 10*3/uL (ref 0.7–4.0)
MCH: 30.3 pg (ref 26.0–34.0)
MCHC: 31.5 g/dL (ref 30.0–36.0)
MCV: 96.2 fL (ref 80.0–100.0)
Monocytes Absolute: 0.4 10*3/uL (ref 0.1–1.0)
Monocytes Relative: 5 %
Neutro Abs: 5.9 10*3/uL (ref 1.7–7.7)
Neutrophils Relative %: 83 %
Platelets: 380 10*3/uL (ref 150–400)
RBC: 3.4 MIL/uL — ABNORMAL LOW (ref 3.87–5.11)
RDW: 14.3 % (ref 11.5–15.5)
WBC: 7.2 10*3/uL (ref 4.0–10.5)
nRBC: 0 % (ref 0.0–0.2)

## 2019-11-23 LAB — COMPREHENSIVE METABOLIC PANEL
ALT: 17 U/L (ref 0–44)
AST: 15 U/L (ref 15–41)
Albumin: 3.1 g/dL — ABNORMAL LOW (ref 3.5–5.0)
Alkaline Phosphatase: 104 U/L (ref 38–126)
Anion gap: 11 (ref 5–15)
BUN: 21 mg/dL (ref 8–23)
CO2: 25 mmol/L (ref 22–32)
Calcium: 9.1 mg/dL (ref 8.9–10.3)
Chloride: 99 mmol/L (ref 98–111)
Creatinine, Ser: 0.67 mg/dL (ref 0.44–1.00)
GFR calc Af Amer: >60 mL/min (ref >60)
GFR calc non Af Amer: >60 mL/min (ref >60)
Glucose, Bld: 133 mg/dL — ABNORMAL HIGH (ref 70–99)
Potassium: 4.2 mmol/L (ref 3.5–5.1)
Sodium: 135 mmol/L (ref 135–145)
Total Bilirubin: 0.5 mg/dL (ref 0.3–1.2)
Total Protein: 6.5 g/dL (ref 6.5–8.1)

## 2019-11-23 LAB — LIPASE, BLOOD: Lipase: 39 U/L (ref 11–51)

## 2019-11-23 LAB — LACTIC ACID, PLASMA: Lactic Acid, Venous: 1.9 mmol/L (ref 0.5–1.9)

## 2019-11-23 MED ORDER — SODIUM CHLORIDE 0.9% FLUSH: 3.0000 mL | Freq: Once | INTRAVENOUS | Status: DC

## 2019-11-23 MED ORDER — SODIUM CHLORIDE 0.9 % IV SOLN
1.0000 g | Freq: Once | INTRAVENOUS | Status: AC
  Administered 2019-11-23: 13:00:00 1 g via INTRAVENOUS
  Filled 2019-11-23: qty 10

## 2019-11-23 MED ORDER — SODIUM CHLORIDE 0.9 % IV BOLUS
500.0000 mL | Freq: Once | INTRAVENOUS | Status: AC
  Administered 2019-11-23: 500 mL via INTRAVENOUS

## 2019-11-23 MED ORDER — CEPHALEXIN 500 MG PO CAPS: 500.0000 mg | ORAL_CAPSULE | Freq: Four times a day (QID) | ORAL | 0 refills | Status: AC

## 2019-11-23 NOTE — Discharge Instructions (Signed)
You have been diagnosed today with UTI, pressure injury, pleural effusion.  At this time there does not appear to be the presence of an emergent medical condition, however there is always the potential for conditions to change. Please read and follow the below instructions.  Please return to the Emergency Department immediately for any new or worsening symptoms. Please be sure to follow up with your Primary Care Provider within one week regarding your visit today; please call their office to schedule an appointment even if you are feeling better for a follow-up visit. Please take the antibiotic Keflex as prescribed for treatment of urinary tract infection associated with Foley.  Please drink plenty of water and get plenty of rest. Please monitor the wounds on the heel closely and follow-up with PCP for reevaluation. Chest x-ray today showed possible small pleural effusion, be sure to discuss this with the primary care doctor at follow-up visit. Incidental findings including chronic hyperinflation of the lungs, aortic atherosclerosis and chronic microvascular ischemic changes of the brain.  Please discuss these findings with your primary care provider at follow-up visit.   Please read the additional information packets attached to your discharge summary.  Do not take your medicine if  develop an itchy rash, swelling in your mouth or lips, or difficulty breathing; call 911 and seek immediate emergency medical attention if this occurs.  Note: Portions of this text may have been transcribed using voice recognition software. Every effort was made to ensure accuracy; however, inadvertent computerized transcription errors may still be present.

## 2019-11-23 NOTE — ED Notes (Signed)
Attempted report back to [facility] and rehab. No answer.

## 2019-11-23 NOTE — ED Triage Notes (Signed)
Pt arrives [facility] from Urology. Pt staying at [facility] for rehab. Pt had hip replacement one month ago. Per EMS; Pt has had steady decline since going into rehab. Pt has had poor PO intake and has had persistent hypotension. EMS noted wounds to pts feet. Pt has foley in place.

## 2019-11-23 NOTE — ED Provider Notes (Signed)
[facility] DEPT Provider Note   CSN: [id number] Arrival date & time: 11/23/19  [id number]     History Chief Complaint  Patient presents with  . Failure To Thrive    Holly Salazar is a 84 y.o. female presents today via EMS from urology office for hypotension.  Per Crystal RN patient has had declining health for the past 1 month following a left hip fracture, unsure blood pressure reading at urologist office that prompted call to EMS.  Patient is alert and oriented to self, place, time, she is not quite sure why she was sent to the ER today.  She reports that she lives in a nursing facility for the past 1 month since she fell and broke her left hip.  She denies any pain reports that she is feeling well at this time and would like to go home.  She denies recent illness, fall/injury, fever/chills, pain, shortness of breath, numbness/weakness or any additional concerns.  HPI     Past Medical History:  Diagnosis Date  . Arthritis    knee and back  . Cancer ([place])    melanoma on back  . Hypertension   . Osteoporosis     Patient Active Problem List   Diagnosis Date Noted  . Protein-calorie malnutrition, severe 10/26/2019  . Closed intertrochanteric fracture of hip, left, initial encounter ([place]) 10/25/2019  . HTN (hypertension) 10/24/2019  . Radius and ulna distal fracture 01/16/2016    Past Surgical History:  Procedure Laterality Date  . APPENDECTOMY    . CARPAL TUNNEL RELEASE Right   . COLONOSCOPY    . FEMUR IM NAIL Left 10/25/2019   Procedure: INTRAMEDULLARY (IM) NAIL FEMORAL;  Surgeon: [name], MD;  Location: [facility];  Service: Orthopedics;  Laterality: Left;  . HIP FRACTURE SURGERY Right   . OPEN REDUCTION INTERNAL FIXATION (ORIF) DISTAL RADIAL FRACTURE Left 01/16/2016   Procedure: OPEN REDUCTION INTERNAL FIXATION (ORIF) LEFT  DISTAL RADIUS FRACTURE WITH REPAIR RECONSTRUCTION AS NEEDED ;  Surgeon: Roseanne Kaufman, MD;  Location: [facility];  Service:  Orthopedics;  Laterality: Left;  . TONSILLECTOMY       OB History   No obstetric history on file.     Family History  Problem Relation Age of Onset  . Heart disease Mother   . Cancer Father     Social History   Tobacco Use  . Smoking status: Never Smoker  . Smokeless tobacco: Never Used  Substance Use Topics  . Alcohol use: No  . Drug use: No    Home Medications Prior to Admission medications   Medication Sig Start Date End Date Taking? Authorizing Provider  aspirin EC 325 MG EC tablet Take 1 tablet (325 mg total) by mouth daily with breakfast. 10/30/19  Yes Gherghe, Vella Redhead, MD  Calcium Carb-Cholecalciferol (CALCIUM 500+D) 500-200 MG-UNIT TABS Take 1 tablet by mouth in the morning and at bedtime.   Yes [provider]  clonazepam (KLONOPIN) 0.125 MG disintegrating tablet Take 0.125 mg by mouth 3 (three) times daily.   Yes [provider]  escitalopram (LEXAPRO) 10 MG tablet Take 10 mg by mouth at bedtime.   Yes [provider]  ferrous sulfate 325 (65 FE) MG tablet Take 325 mg by mouth daily with breakfast.   Yes [provider]  HYDROcodone-acetaminophen (NORCO/VICODIN) 5-325 MG tablet Take 1-2 tablets by mouth every 6 (six) hours as needed for moderate pain. 10/29/19  Yes Caren Griffins, MD  lisinopril (ZESTRIL) 10 MG  tablet Take 10 mg by mouth daily.    Yes [provider]  mirtazapine (REMERON) 7.5 MG tablet Take 7.5 mg by mouth at bedtime.   Yes [provider]  Multiple Vitamin (MULTIVITAMIN WITH MINERALS) TABS tablet Take 1 tablet by mouth daily.   Yes [provider]  naproxen sodium (ANAPROX) 220 MG tablet Take 220 mg by mouth daily as needed (pain).   Yes [provider]  pantoprazole (PROTONIX) 40 MG tablet Take 1 tablet (40 mg total) by mouth daily. 10/29/19 10/28/20 Yes Gherghe, Vella Redhead, MD  polyethylene glycol (MIRALAX / GLYCOLAX) 17 g packet Take 17 g by mouth daily.   Yes [provider]  sennosides-docusate sodium (SENOKOT-S) 8.6-50 MG tablet Take 1 tablet by mouth daily.   Yes [provider]    Allergies    Patient has no known allergies.  Review of Systems   Review of Systems Ten systems are reviewed and are negative for acute change except as noted in the HPI  Physical Exam Updated Vital Signs BP 130/61   Pulse 89   Temp 98.3 F (36.8 C) (Rectal)   Resp 20   SpO2 97%   Physical Exam Constitutional:      General: She is not in acute distress.    Appearance: Normal appearance. She is well-developed. She is not ill-appearing or diaphoretic.  HENT:     Head: Normocephalic and atraumatic. No abrasion or contusion.     Jaw: There is normal jaw occlusion. No trismus.     Right Ear: External ear normal.     Left Ear: External ear normal.     Nose: Nose normal.  Eyes:     General: Vision grossly intact. Gaze aligned appropriately.     Pupils: Pupils are equal, round, and reactive to light.  Neck:     Trachea: Trachea and phonation normal. No tracheal deviation.  Cardiovascular:     Pulses:          Dorsalis pedis pulses are 2+ on the right side and 2+ on the left side.  Pulmonary:     Effort: Pulmonary effort is normal. No respiratory distress.  Abdominal:     General: There is no distension.     Palpations: Abdomen is soft.     Tenderness: There is no abdominal tenderness. There is no guarding or rebound.  Musculoskeletal:        General: Normal range of motion.     Cervical back: Normal range of motion.     Comments: No midline C/T/L spinal tenderness to palpation, no paraspinal muscle tenderness, no deformity, crepitus, or step-off noted. No sign of injury to the neck or back. - All major joints palpated and mobilized with appropriate range of motion for age without pain or crepitus.  Minimal pain with movement of the left hip, patient reports this is baseline for her secondary to hip fracture last month.  Feet:     Right foot:      Protective Sensation: 5 sites tested. 5 sites sensed.     Left foot:     Protective Sensation: 5 sites tested. 5 sites sensed.     Comments: Two deep tissue pressure injuries of the bilateral heels left worse than right see picture below.  No surrounding erythema, no streaking increased warmth or pain with palpation.  Skin is intact. Skin:    General: Skin is warm and dry.          Comments: Scar present right  upper back, well-healed, patient reports secondary to excision of skin cancer. - Surgical scars distant for left hip replacement, well-healing no evidence of infection.  Neurological:     Mental Status: She is alert.     GCS: GCS eye subscore is 4. GCS verbal subscore is 5. GCS motor subscore is 6.     Comments: Speech is clear and goal oriented, follows commands Major Cranial nerves without deficit, no facial droop Normal strength in upper and lower extremities bilaterally including dorsiflexion and plantar flexion, strong and equal grip strength Sensation normal to light and sharp touch Moves extremities without ataxia, coordination intact No pronator drift  Psychiatric:        Behavior: Behavior normal.         ED Results / Procedures / Treatments   Labs (all labs ordered are listed, but only abnormal results are displayed) Labs Reviewed  CBC WITH DIFFERENTIAL/PLATELET - Abnormal; Notable for the following components:      Result Value   RBC 3.40 (*)    Hemoglobin 10.3 (*)    HCT 32.7 (*)    All other components within normal limits  URINALYSIS, ROUTINE W REFLEX MICROSCOPIC - Abnormal; Notable for the following components:   Color, Urine AMBER (*)    APPearance CLOUDY (*)    Hgb urine dipstick MODERATE (*)    Ketones, ur 5 (*)    Protein, ur 100 (*)    Leukocytes,Ua MODERATE (*)    RBC / HPF >50 (*)    WBC, UA >50 (*)    Bacteria, UA MANY (*)    Non Squamous Epithelial 0-5 (*)    All other components within normal limits  COMPREHENSIVE METABOLIC PANEL -  Abnormal; Notable for the following components:   Glucose, Bld 133 (*)    Albumin 3.1 (*)    All other components within normal limits  URINE CULTURE  LACTIC ACID, PLASMA  LIPASE, BLOOD  LACTIC ACID, PLASMA    EKG EKG Interpretation  Date/Time:  Thursday November 23 2019 10:13:20 EDT Ventricular Rate:  75 PR Interval:    QRS Duration: 88 QT Interval:  364 QTC Calculation: 407 R Axis:   50 Text Interpretation: Sinus rhythm Ventricular premature complex Consider left atrial enlargement RSR' in V1 or V2, probably normal variant Confirmed by Nat Christen [phone]) on 11/23/2019 11:02:15 AM Also confirmed by Nat Christen [phone])  on 11/23/2019 1:34:44 PM   Radiology CT Head Wo Contrast  Result Date: 11/23/2019 CLINICAL DATA:  Mental status change EXAM: CT HEAD WITHOUT CONTRAST TECHNIQUE: Contiguous axial images were obtained from the base of the skull through the vertex without intravenous contrast. COMPARISON:  None. FINDINGS: Brain: There is no acute intracranial hemorrhage, mass effect, or edema. Gray-white differentiation is preserved. There is no extra-axial fluid collection. Patchy hypoattenuation in the supratentorial white matter is nonspecific but may reflect mild chronic microvascular ischemic changes. Prominence of the ventricles and sulci reflects mild generalized parenchymal volume loss. Vascular: There is mild atherosclerotic calcification at the skull base. Skull: Calvarium is unremarkable. Sinuses/Orbits: No acute finding. Other: None. IMPRESSION: No acute intracranial abnormality. Chronic microvascular ischemic changes. Electronically Signed   By: Macy Mis M.D.   On: 11/23/2019 12:11   DG Chest Portable 1 View  Result Date: 11/23/2019 CLINICAL DATA:  Generalized weakness. EXAM: PORTABLE CHEST 1 VIEW COMPARISON:  Radiograph 10/24/2019 FINDINGS: Normal heart size and mediastinal contours. Atherosclerosis of the aortic arch. The lungs are hyperinflated. Blunting of the costophrenic  angles appears progressed from  prior exam with increased hazy basilar opacity on the right, suspicious or small effusions. No evidence of pulmonary edema. No confluent airspace disease. No pneumothorax. Bones are under mineralized. IMPRESSION: 1. Increased blunting of the costophrenic angles with increased hazy basilar opacity on the right, suspicious for small pleural effusions, new from exam 1 month ago. 2. Chronic hyperinflation. Aortic Atherosclerosis (ICD10-I70.0). Electronically Signed   By: Keith Rake M.D.   On: 11/23/2019 10:46    Procedures Procedures (including critical care time)  Medications Ordered in ED Medications  cefTRIAXone (ROCEPHIN) 1 g in sodium chloride 0.9 % 100 mL IVPB (1 g Intravenous New Bag/Given (Non-Interop) 11/23/19 1322)  sodium chloride 0.9 % bolus 500 mL (500 mLs Intravenous New Bag/Given (Non-Interop) 11/23/19 1404)    ED Course  I have reviewed the triage vital signs and the nursing notes.  Pertinent labs & imaging results that were available during my care of the patient were reviewed by me and considered in my medical decision making (see chart for details).  Clinical Course as of Nov 22 1457  Thu Nov 23, 2019  1033 [phone] No answer   [BM]  1340 [phone] No answer   [BM]  1352 [phone]: Call back   [BM]  [facility]: Hospice; dementia.   [BM]    Clinical Course User Index [BM] Gari Crown   MDM Rules/Calculators/A&P                     84 year old female presents from urologist office for low blood pressure.  On arrival patient patient is normotensive well-appearing and in no acute distress.  She reports that she is feeling well has no specific complaints is unsure why she was sent to this facility.  Patient has recently transitioned into a nursing facility after she suffered a fall around 1 month ago and had a left hip fracture which has been repaired.  The surgical site is well-appearing without evidence of  infection, good range of motion of the right hip given recent injury.  Additionally she has a Foley catheter in place.  No fever tachycardia or hypotension on arrival.  Will obtain basic blood work and urinalysis along with screening chest x-ray.  Patient does have some confusion, will obtain CT head for evaluation as there is not much supplemental history available and no answer at facility. - Labs reviewed, significant for UTI will treat with IV Rocephin and plan for discharge with Keflex.  There is no leukocytosis and patient does not meet SIRS criteria, does not appear to have pyelonephritis or other ascending infections.  Hemoglobin 10.3 appears slightly improved from baseline.  Lactic is within normal limits.  CBC shows no evidence of kidney injury, emergent electrolyte derangement or elevation of LFTs.  Lipase within normal limits no evidence of pancreatitis.  Chest x-ray:  IMPRESSION:  1. Increased blunting of the costophrenic angles with increased hazy  basilar opacity on the right, suspicious for small pleural  effusions, new from exam 1 month ago.  2. Chronic hyperinflation.    Aortic Atherosclerosis (ICD10-I70.0).  I personally viewed patient's chest x-ray and agree with radiologist interpretation.    CT Head:  IMPRESSION:  No acute intracranial abnormality. Chronic microvascular ischemic  changes.  I personally reviewed patient's CT head agree with radiologist interpretation no evidence of intracranial hemorrhage. - Patient has not had any chest pain or shortness of breath no tachycardia or hypoxia, will note the small pleural effusion on discharge paperwork and encourage PCP  follow-up. Discussed with Dr. Lacinda Axon who agrees. - I was able to contacted Quillian Quince the RN at patient's facility, he confirms patient does have dementia normally only oriented to self and place,, states understanding of care plan and accepted patient back facility. - Patient reassessed the niece is now at  bedside, she is upset and does not want patient returning to [facility] citing poor care.  She is very concerned about the small pressure injuries of patient's heels, they appear stage 1.  Patient extensive discussion had between myself and patient's niece as well as between Dr. Lacinda Axon and patient's niece.  Plan of care is consultation to transition of care team to find new facility.  Patient medically cleared will be discharged with Keflex once social work is complete.  At this time there does not appear to be any evidence of an acute emergency medical condition and the patient appears stable for discharge with appropriate outpatient follow up. Diagnosis was discussed with patient who verbalizes understanding of care plan and is agreeable to discharge. I have discussed return precautions with patient who verbalizes understanding of return precautions. Patient encouraged to follow-up with their PCP. All questions answered.   Note: Portions of this report may have been transcribed using voice recognition software. Every effort was made to ensure accuracy; however, inadvertent computerized transcription errors may still be present. Final Clinical Impression(s) / ED Diagnoses Final diagnoses:  Urinary tract infection associated with indwelling urethral catheter, initial encounter ([place])  Pressure injury of heel, stage 1, unspecified laterality    Rx / DC Orders ED Discharge Orders    None       Gari Crown 11/23/19 1516    Nat Christen, MD 11/24/19 [phone]

## 2019-11-23 NOTE — TOC Initial Note (Addendum)
Transition of Care [facility]) - Initial/Assessment Note    Patient Details  Name: Holly Salazar MRN: [id number] Date of Birth: 10-02-1935  [facility] CM/SW Contact:    Erenest Rasher, RN Phone Number: [phone] 11/23/2019, 3:41 PM  Clinical Narrative:                  TOC CM spoke to pt's niece, Nelson Chimes and states he wants pt to go to [facility] rehab. Pt currently from [facility]. Received permission from niece to create FL2 and fax referral to SNF rehab. Waiting PT recommendation.   4:20 pm TOC CM spoke to niece and she is agreeable for pt to go back to [facility] SNF. Plan is for her to transition back to [facility] once she is stable. Spoke to [facility] SNF admission rep, Irine Seal and ED RN can call report to [phone]. Updated ED RN and ED provider. PTAR arranged.   Expected Discharge Plan: Skilled Nursing Facility Barriers to Discharge: SNF Pending bed offer   Patient Goals and CMS Choice Patient states their goals for this hospitalization and ongoing recovery are:: does not want pt to return to current SNF rehab CMS Medicare.gov Compare Post Acute Care list provided to:: Patient Represenative (must comment)(niece, Nelson Chimes) Choice offered to / list presented to : [place] / [facility]  Expected Discharge Plan and Services Expected Discharge Plan: [place] Choice: [place] Living arrangements for the past 2 months: [place]                                      Prior Living Arrangements/Services Living arrangements for the past 2 months: [place] Lives with:: Facility Resident Patient language and need for interpreter reviewed:: Yes        Need for Family Participation in Patient Care: Yes (Comment) Care giver support system in place?: Yes (comment)   Criminal Activity/Legal Involvement Pertinent to Current Situation/Hospitalization: No -  Comment as needed  Activities of Daily Living      Permission Sought/Granted   Permission granted to share information with : Yes, Verbal Permission Granted  Share Information with NAME: Nelson Chimes  Permission granted to share info w AGENCY: SNF rehab  Permission granted to share info w Relationship: niece  Permission granted to share info w Contact Information: [phone]  Emotional Assessment           Psych Involvement: No (comment)  Admission diagnosis:  hypotensions;failure to thrive Patient Active Problem List   Diagnosis Date Noted  . Protein-calorie malnutrition, severe 10/26/2019  . Closed intertrochanteric fracture of hip, left, initial encounter ([place]) 10/25/2019  . HTN (hypertension) 10/24/2019  . Radius and ulna distal fracture 01/16/2016   PCP:  Kathyrn Lass, MD Pharmacy:   [facility] [address] [place] - [address] [place] [address] Phone: [phone] Fax: [phone]     Social Determinants of Health (SDOH) Interventions    Readmission Risk Interventions No flowsheet data found.

## 2019-11-26 LAB — URINE CULTURE: Culture: >=100000 — AB

## 2019-11-27 ENCOUNTER — Telehealth: Payer: Self-pay | Admitting: Emergency Medicine

## 2019-11-27 DIAGNOSIS — L89610 Pressure ulcer of right heel, unstageable: Secondary | ICD-10-CM | POA: Diagnosis not present

## 2019-11-27 DIAGNOSIS — I10 Essential (primary) hypertension: Secondary | ICD-10-CM | POA: Diagnosis not present

## 2019-11-27 DIAGNOSIS — N390 Urinary tract infection, site not specified: Secondary | ICD-10-CM | POA: Diagnosis not present

## 2019-11-27 DIAGNOSIS — F3489 Other specified persistent mood disorders: Secondary | ICD-10-CM | POA: Diagnosis not present

## 2019-11-27 DIAGNOSIS — R338 Other retention of urine: Secondary | ICD-10-CM | POA: Diagnosis not present

## 2019-11-27 DIAGNOSIS — L89620 Pressure ulcer of left heel, unstageable: Secondary | ICD-10-CM | POA: Diagnosis not present

## 2019-11-27 DIAGNOSIS — S72002D Fracture of unspecified part of neck of left femur, subsequent encounter for closed fracture with routine healing: Secondary | ICD-10-CM | POA: Diagnosis not present

## 2019-11-27 NOTE — Telephone Encounter (Signed)
Post ED Visit - Positive Culture Follow-up  Culture report reviewed by antimicrobial stewardship pharmacist: [place] Team []  Elenor Quinones, Pharm.D. []  Heide Guile, Pharm.D., BCPS AQ-ID []  Parks Neptune, Pharm.D., BCPS []  Alycia Rossetti, Pharm.D., BCPS []  [place], Pharm.D., BCPS, AAHIVP []  Legrand Como, Pharm.D., BCPS, AAHIVP []  Salome Arnt, PharmD, BCPS []  Johnnette Gourd, PharmD, BCPS []  Hughes Better, PharmD, BCPS []  Leeroy Cha, PharmD []  Laqueta Linden, PharmD, BCPS []  Albertina Parr, PharmD  [place] Team []  Leodis Sias, PharmD []  Lindell Spar, PharmD [x]  Royetta Asal, PharmD []  Graylin Shiver, Rph []  Rema Fendt) Glennon Mac, PharmD []  Arlyn Dunning, PharmD []  Netta Cedars, PharmD []  Dia Sitter, PharmD []  Leone Haven, PharmD []  Gretta Arab, PharmD []  Theodis Shove, PharmD []  Peggyann Juba, PharmD []  Reuel Boom, PharmD   Positive urine culture Treated with cephalexin, organism sensitive to the same and no further patient follow-up is required at this time.  Hazle Nordmann 11/27/2019, 11:33 AM

## 2019-11-28 DIAGNOSIS — N390 Urinary tract infection, site not specified: Secondary | ICD-10-CM | POA: Diagnosis not present

## 2019-11-28 DIAGNOSIS — I9589 Other hypotension: Secondary | ICD-10-CM | POA: Diagnosis not present

## 2019-12-04 DIAGNOSIS — R262 Difficulty in walking, not elsewhere classified: Secondary | ICD-10-CM | POA: Diagnosis not present

## 2019-12-04 DIAGNOSIS — S7222XA Displaced subtrochanteric fracture of left femur, initial encounter for closed fracture: Secondary | ICD-10-CM | POA: Diagnosis not present

## 2019-12-04 DIAGNOSIS — D62 Acute posthemorrhagic anemia: Secondary | ICD-10-CM | POA: Diagnosis not present

## 2019-12-04 DIAGNOSIS — I951 Orthostatic hypotension: Secondary | ICD-10-CM | POA: Diagnosis not present

## 2019-12-06 DIAGNOSIS — F419 Anxiety disorder, unspecified: Secondary | ICD-10-CM | POA: Diagnosis not present

## 2019-12-06 DIAGNOSIS — Z8781 Personal history of (healed) traumatic fracture: Secondary | ICD-10-CM | POA: Diagnosis not present

## 2019-12-06 DIAGNOSIS — M810 Age-related osteoporosis without current pathological fracture: Secondary | ICD-10-CM | POA: Diagnosis not present

## 2019-12-06 DIAGNOSIS — R195 Other fecal abnormalities: Secondary | ICD-10-CM | POA: Diagnosis not present

## 2019-12-06 DIAGNOSIS — L89609 Pressure ulcer of unspecified heel, unspecified stage: Secondary | ICD-10-CM | POA: Diagnosis not present

## 2019-12-06 DIAGNOSIS — D649 Anemia, unspecified: Secondary | ICD-10-CM | POA: Diagnosis not present

## 2019-12-07 DIAGNOSIS — R195 Other fecal abnormalities: Secondary | ICD-10-CM | POA: Diagnosis not present

## 2019-12-07 DIAGNOSIS — L89620 Pressure ulcer of left heel, unstageable: Secondary | ICD-10-CM | POA: Diagnosis not present

## 2019-12-07 DIAGNOSIS — R338 Other retention of urine: Secondary | ICD-10-CM | POA: Diagnosis not present

## 2019-12-07 DIAGNOSIS — I951 Orthostatic hypotension: Secondary | ICD-10-CM | POA: Diagnosis not present

## 2019-12-07 DIAGNOSIS — L89610 Pressure ulcer of right heel, unstageable: Secondary | ICD-10-CM | POA: Diagnosis not present

## 2019-12-08 ENCOUNTER — Ambulatory Visit (INDEPENDENT_AMBULATORY_CARE_PROVIDER_SITE_OTHER): Payer: Medicare Other

## 2019-12-08 ENCOUNTER — Ambulatory Visit (INDEPENDENT_AMBULATORY_CARE_PROVIDER_SITE_OTHER): Payer: Medicare Other | Admitting: Physician Assistant

## 2019-12-08 ENCOUNTER — Encounter: Payer: Self-pay | Admitting: Physician Assistant

## 2019-12-08 ENCOUNTER — Other Ambulatory Visit: Payer: Self-pay

## 2019-12-08 VITALS — Ht 67.0 in | Wt 130.0 lb

## 2019-12-08 DIAGNOSIS — S72002A Fracture of unspecified part of neck of left femur, initial encounter for closed fracture: Secondary | ICD-10-CM | POA: Diagnosis not present

## 2019-12-08 NOTE — Progress Notes (Signed)
Office Visit Note   Patient: Holly Salazar           Date of Birth: 1936-04-04           MRN: [id number] Visit Date: 12/08/2019              Requested by: Kathyrn Lass, [place],  [place] [address] PCP: Kathyrn Lass, MD  Chief Complaint  Patient presents with  . Left Hip - Routine Post Op    11/22/19 left IM nail femur       HPI: This is a pleasant 84 year old woman who is 6 weeks status post ORIF left hip fracture.  She is currently in a nursing facility.  She is accompanied by her niece.  She is also recovering from urinary tract infection.  She is denying any pain and is progressing with physical therapy as much as she can her niece is also very concerned because she wants to transfer her to an assisted living and because of the pressure ulcers on her heels they will not take her in the nursing facility has not staged them more specifically the one on the left side Assessment & Plan: Visit Diagnoses:  1. Closed left hip fracture, initial encounter ([place])     Plan: Findings consistent with grade 1 pressure ulcers the posterior heels.  She is doing well regarding her hip.  Should advance activities as tolerated and continue to work with physical therapy to build endurance and safety ambulation.  She should follow-up with Dr. Lorin Mercy in 4 weeks.  Continue to avoid pressure through her heels  Follow-Up Instructions: No follow-ups on file.   Ortho Exam  Patient is alert, oriented, no adenopathy, well-dressed, normal affect, normal respiratory effort. Left hip well-healed surgical incision.  She is stiff with internal and external rotation but does not have any pain.  Compartments are soft and nontender distal CMS is intact  Right heel and left heel are both stage I pressure ulcers.  There is no surrounding cellulitis and only a drop of change drainage on the left.  No fluctuance  Imaging: No results found. No images are attached to the encounter.  Labs: Lab  Results  Component Value Date   REPTSTATUS 11/26/2019 FINAL 11/23/2019   CULT >=100,000 COLONIES/mL ESCHERICHIA COLI (A) 11/23/2019   LABORGA ESCHERICHIA COLI (A) 11/23/2019     Lab Results  Component Value Date   ALBUMIN 3.1 (L) 11/23/2019   ALBUMIN 3.4 (L) 10/26/2019    No results found for: MG No results found for: VD25OH  No results found for: PREALBUMIN CBC EXTENDED Latest Ref Rng & Units 11/23/2019 10/29/2019 10/28/2019  WBC 4.0 - 10.5 K/uL 7.2 - 7.3  RBC 3.87 - 5.11 MIL/uL 3.40(L) - 2.79(L)  HGB 12.0 - 15.0 g/dL 10.3(L) 8.6(L) 8.4(L)  HCT 36.0 - 46.0 % 32.7(L) 27.1(L) 26.5(L)  PLT 150 - 400 K/uL 380 - 168  NEUTROABS 1.7 - 7.7 K/uL 5.9 - -  LYMPHSABS 0.7 - 4.0 K/uL 0.9 - -     Body mass index is 20.36 kg/m.  Orders:  Orders Placed This Encounter  Procedures  . XR HIP UNILAT W OR W/O PELVIS 2-3 VIEWS LEFT   No orders of the defined types were placed in this encounter.    Procedures: No procedures performed  Clinical Data: No additional findings.  ROS:  All other systems negative, except as noted in the HPI. Review of Systems  Objective: Vital Signs: Ht 5\' 7"  (1.702 m)  Wt 130 lb (59 kg)   BMI 20.36 kg/m   Specialty Comments:  No specialty comments available.  PMFS History: Patient Active Problem List   Diagnosis Date Noted  . Protein-calorie malnutrition, severe 10/26/2019  . Closed intertrochanteric fracture of hip, left, initial encounter ([place]) 10/25/2019  . HTN (hypertension) 10/24/2019  . Radius and ulna distal fracture 01/16/2016   Past Medical History:  Diagnosis Date  . Arthritis    knee and back  . Cancer ([place])    melanoma on back  . Hypertension   . Osteoporosis     Family History  Problem Relation Age of Onset  . Heart disease Mother   . Cancer Father     Past Surgical History:  Procedure Laterality Date  . APPENDECTOMY    . CARPAL TUNNEL RELEASE Right   . COLONOSCOPY    . FEMUR IM NAIL Left 10/25/2019   Procedure:  INTRAMEDULLARY (IM) NAIL FEMORAL;  Surgeon: [name], MD;  [facility]: [facility];  Service: Orthopedics;  Laterality: Left;  . HIP FRACTURE SURGERY Right   . OPEN REDUCTION INTERNAL FIXATION (ORIF) DISTAL RADIAL FRACTURE Left 01/16/2016   Procedure: OPEN REDUCTION INTERNAL FIXATION (ORIF) LEFT  DISTAL RADIUS FRACTURE WITH REPAIR RECONSTRUCTION AS NEEDED ;  Surgeon: Roseanne Kaufman, MD;  Location: [facility];  Service: Orthopedics;  Laterality: Left;  . TONSILLECTOMY     Social History   Occupational History  . Not on file  Tobacco Use  . Smoking status: Never Smoker  . Smokeless tobacco: Never Used  Substance and Sexual Activity  . Alcohol use: No  . Drug use: No  . Sexual activity: Not on file

## 2019-12-08 NOTE — Progress Notes (Signed)
xr left femur

## 2019-12-11 ENCOUNTER — Ambulatory Visit (INDEPENDENT_AMBULATORY_CARE_PROVIDER_SITE_OTHER): Payer: Medicare Other | Admitting: Orthopedic Surgery

## 2019-12-11 ENCOUNTER — Other Ambulatory Visit: Payer: Self-pay

## 2019-12-11 ENCOUNTER — Encounter: Payer: Self-pay | Admitting: Orthopedic Surgery

## 2019-12-11 VITALS — Ht 67.0 in | Wt 130.0 lb

## 2019-12-11 DIAGNOSIS — L97421 Non-pressure chronic ulcer of left heel and midfoot limited to breakdown of skin: Secondary | ICD-10-CM | POA: Diagnosis not present

## 2019-12-11 DIAGNOSIS — B351 Tinea unguium: Secondary | ICD-10-CM

## 2019-12-11 DIAGNOSIS — S72002A Fracture of unspecified part of neck of left femur, initial encounter for closed fracture: Secondary | ICD-10-CM | POA: Diagnosis not present

## 2019-12-11 DIAGNOSIS — L97411 Non-pressure chronic ulcer of right heel and midfoot limited to breakdown of skin: Secondary | ICD-10-CM

## 2019-12-11 NOTE — Progress Notes (Signed)
Office Visit Note   Patient: Holly Salazar           Date of Birth: [birth year]           MRN: [id number] Visit Date: 12/11/2019              Requested by: Kathyrn Lass, [place],  [place] [address] PCP: Kathyrn Lass, MD  Chief Complaint  Patient presents with  . Left Hip - Routine Post Op    11/22/19 IM nail femur       HPI: Patient is an 84 year old woman who is status post internal fixation for hip fracture.  Postoperatively patient developed decubitus heel ulcers bilaterally she is currently in skilled nursing at [facility].  She is not using any protective shoe wear.  Assessment & Plan: Visit Diagnoses:  1. Closed left hip fracture, initial encounter ([place])   2. Non-pressure chronic ulcer of left heel and midfoot limited to breakdown of skin ([place])   3. Non-pressure chronic ulcer of right heel and midfoot limited to breakdown of skin ([place])     Plan: Bilateral PRAFO's were applied nails were trimmed x10 will reevaluate in 4 weeks.  Discussed with the patient and her daughter that she most likely is not safe to progress to assisted living at this time due to the unstageable ulcers on both heels.  Follow-Up Instructions: Return in about 4 weeks (around 01/08/2020).   Ortho Exam  Patient is alert, oriented, no adenopathy, well-dressed, normal affect, normal respiratory effort. Examination patient has good pulses bilaterally.  She has a full-thickness eschar on the left heel which is unstageable.  She has a superficial eschar on the right heel which appears to be more of a stage IV ulcer.  Patient has thickened discolored onychomycotic nails x10 she is unable to safely trim the nails on her own and the nails were trimmed [id number] without complications.  Imaging: No results found. No images are attached to the encounter.  Labs: Lab Results  Component Value Date   REPTSTATUS 11/26/2019 FINAL 11/23/2019   CULT >=100,000 COLONIES/mL ESCHERICHIA COLI (A) 11/23/2019   LABORGA ESCHERICHIA COLI (A) 11/23/2019     Lab Results  Component Value Date   ALBUMIN 3.1 (L) 11/23/2019   ALBUMIN 3.4 (L) 10/26/2019    No results found for: MG No results found for: VD25OH  No results found for: PREALBUMIN CBC EXTENDED Latest Ref Rng & Units 11/23/2019 10/29/2019 10/28/2019  WBC 4.0 - 10.5 K/uL 7.2 - 7.3  RBC 3.87 - 5.11 MIL/uL 3.40(L) - 2.79(L)  HGB 12.0 - 15.0 g/dL 10.3(L) 8.6(L) 8.4(L)  HCT 36.0 - 46.0 % 32.7(L) 27.1(L) 26.5(L)  PLT 150 - 400 K/uL 380 - 168  NEUTROABS 1.7 - 7.7 K/uL 5.9 - -  LYMPHSABS 0.7 - 4.0 K/uL 0.9 - -     Body mass index is 20.36 kg/m.  Orders:  No orders of the defined types were placed in this encounter.  No orders of the defined types were placed in this encounter.    Procedures: No procedures performed  Clinical Data: No additional findings.  ROS:  All other systems negative, except as noted in the HPI. Review of Systems  Objective: Vital Signs: Ht 5\' 7"  (1.702 m)   Wt 130 lb (59 kg)   BMI 20.36 kg/m   Specialty Comments:  No specialty comments available.  PMFS History: Patient Active Problem List   Diagnosis Date Noted  . Protein-calorie malnutrition, severe 10/26/2019  . Closed intertrochanteric  fracture of hip, left, initial encounter ([place]) 10/25/2019  . HTN (hypertension) 10/24/2019  . Radius and ulna distal fracture 01/16/2016   Past Medical History:  Diagnosis Date  . Arthritis    knee and back  . Cancer ([place])    melanoma on back  . Hypertension   . Osteoporosis     Family History  Problem Relation Age of Onset  . Heart disease Mother   . Cancer Father     Past Surgical History:  Procedure Laterality Date  . APPENDECTOMY    . CARPAL TUNNEL RELEASE Right   . COLONOSCOPY    . FEMUR IM NAIL Left 10/25/2019   Procedure: INTRAMEDULLARY (IM) NAIL FEMORAL;  Surgeon: [name], MD;  [facility];  Service: Orthopedics;  Laterality: Left;  . HIP FRACTURE SURGERY Right   . OPEN REDUCTION  INTERNAL FIXATION (ORIF) DISTAL RADIAL FRACTURE Left 01/16/2016   Procedure: OPEN REDUCTION INTERNAL FIXATION (ORIF) LEFT  DISTAL RADIUS FRACTURE WITH REPAIR RECONSTRUCTION AS NEEDED ;  Surgeon: Roseanne Kaufman, MD;  Location: [facility];  Service: Orthopedics;  Laterality: Left;  . TONSILLECTOMY     Social History   Occupational History  . Not on file  Tobacco Use  . Smoking status: Never Smoker  . Smokeless tobacco: Never Used  Substance and Sexual Activity  . Alcohol use: No  . Drug use: No  . Sexual activity: Not on file

## 2019-12-12 DIAGNOSIS — R195 Other fecal abnormalities: Secondary | ICD-10-CM | POA: Diagnosis not present

## 2019-12-14 DIAGNOSIS — L89620 Pressure ulcer of left heel, unstageable: Secondary | ICD-10-CM | POA: Diagnosis not present

## 2019-12-14 DIAGNOSIS — L89610 Pressure ulcer of right heel, unstageable: Secondary | ICD-10-CM | POA: Diagnosis not present

## 2019-12-15 ENCOUNTER — Other Ambulatory Visit: Payer: Self-pay | Admitting: *Deleted

## 2019-12-15 DIAGNOSIS — R195 Other fecal abnormalities: Secondary | ICD-10-CM | POA: Diagnosis not present

## 2019-12-15 NOTE — Patient Outreach (Signed)
Late entry for 12/14/19  Screened for potential [facility] needs as a benefit of  [employer].  Member is receiving skilled therapy at [facility].  Writer attended telephonic interdisciplinary team meeting to assess for disposition needs and transition plan for resident.   Member to have [facility] assessment on Monday, 12/18/19 to see if they will accept.  Will continue to follow for transition plans.   [name], MSN-Ed, RN,BSN [place] Acute Care Coordinator [phone] [facility]) [phone]  (Toll free office)

## 2019-12-18 ENCOUNTER — Encounter (HOSPITAL_BASED_OUTPATIENT_CLINIC_OR_DEPARTMENT_OTHER): Payer: Medicare Other | Admitting: Internal Medicine

## 2019-12-19 DIAGNOSIS — F4323 Adjustment disorder with mixed anxiety and depressed mood: Secondary | ICD-10-CM | POA: Diagnosis not present

## 2019-12-19 DIAGNOSIS — S72142S Displaced intertrochanteric fracture of left femur, sequela: Secondary | ICD-10-CM | POA: Diagnosis not present

## 2019-12-19 DIAGNOSIS — F064 Anxiety disorder due to known physiological condition: Secondary | ICD-10-CM | POA: Diagnosis not present

## 2019-12-19 DIAGNOSIS — Z9181 History of falling: Secondary | ICD-10-CM | POA: Diagnosis not present

## 2019-12-19 DIAGNOSIS — E43 Unspecified severe protein-calorie malnutrition: Secondary | ICD-10-CM | POA: Diagnosis not present

## 2019-12-21 DIAGNOSIS — R195 Other fecal abnormalities: Secondary | ICD-10-CM | POA: Diagnosis not present

## 2019-12-21 DIAGNOSIS — S72002D Fracture of unspecified part of neck of left femur, subsequent encounter for closed fracture with routine healing: Secondary | ICD-10-CM | POA: Diagnosis not present

## 2019-12-21 DIAGNOSIS — R339 Retention of urine, unspecified: Secondary | ICD-10-CM | POA: Diagnosis not present

## 2019-12-26 DIAGNOSIS — D6489 Other specified anemias: Secondary | ICD-10-CM | POA: Diagnosis not present

## 2019-12-26 DIAGNOSIS — E440 Moderate protein-calorie malnutrition: Secondary | ICD-10-CM | POA: Diagnosis not present

## 2019-12-26 DIAGNOSIS — R338 Other retention of urine: Secondary | ICD-10-CM | POA: Diagnosis not present

## 2019-12-26 DIAGNOSIS — M810 Age-related osteoporosis without current pathological fracture: Secondary | ICD-10-CM | POA: Diagnosis not present

## 2019-12-26 DIAGNOSIS — R0989 Other specified symptoms and signs involving the circulatory and respiratory systems: Secondary | ICD-10-CM | POA: Diagnosis not present

## 2019-12-26 DIAGNOSIS — S72002D Fracture of unspecified part of neck of left femur, subsequent encounter for closed fracture with routine healing: Secondary | ICD-10-CM | POA: Diagnosis not present

## 2019-12-26 DIAGNOSIS — R195 Other fecal abnormalities: Secondary | ICD-10-CM | POA: Diagnosis not present

## 2019-12-26 DIAGNOSIS — L89620 Pressure ulcer of left heel, unstageable: Secondary | ICD-10-CM | POA: Diagnosis not present

## 2019-12-26 DIAGNOSIS — F22 Delusional disorders: Secondary | ICD-10-CM | POA: Diagnosis not present

## 2019-12-26 DIAGNOSIS — F3489 Other specified persistent mood disorders: Secondary | ICD-10-CM | POA: Diagnosis not present

## 2019-12-26 DIAGNOSIS — L89610 Pressure ulcer of right heel, unstageable: Secondary | ICD-10-CM | POA: Diagnosis not present

## 2020-01-04 ENCOUNTER — Other Ambulatory Visit: Payer: Self-pay | Admitting: *Deleted

## 2020-01-04 NOTE — Patient Outreach (Signed)
THN Post- Acute Care Coordinator follow up  Ms. Shreiner remains at [facility] Update received indicating member will transition to [facility]F once her heel wound is fully healed. Currently receiving daily dressing change.  Will continue to follow while residing in SNF.    [name], MSN-Ed, RN,BSN [place] Acute Care Coordinator [phone] [facility]) [phone]  (Toll free office)

## 2020-01-05 ENCOUNTER — Encounter: Payer: Self-pay | Admitting: Orthopaedic Surgery

## 2020-01-05 ENCOUNTER — Other Ambulatory Visit: Payer: Self-pay

## 2020-01-05 ENCOUNTER — Ambulatory Visit (INDEPENDENT_AMBULATORY_CARE_PROVIDER_SITE_OTHER): Payer: Medicare Other | Admitting: Orthopaedic Surgery

## 2020-01-05 VITALS — BP 124/63 | HR 96 | Ht 67.0 in | Wt 130.0 lb

## 2020-01-05 DIAGNOSIS — S72142A Displaced intertrochanteric fracture of left femur, initial encounter for closed fracture: Secondary | ICD-10-CM

## 2020-01-05 DIAGNOSIS — L97409 Non-pressure chronic ulcer of unspecified heel and midfoot with unspecified severity: Secondary | ICD-10-CM

## 2020-01-05 NOTE — Progress Notes (Signed)
   Post-Op Visit Note   Patient: Holly Salazar           Date of Birth: 1936/01/14           MRN: [id number] Visit Date: 01/05/2020 PCP: Kathyrn Lass, MD   Assessment & Plan: Follow-up hip fracture left with intramedullary nail. Postoperatively she had bilateral heel breakdown. Dr. Sharol Given saw her placed her in [facility] boots to protect her heel. Left heel size of a nickel right heel size of a dime with interval healing. Continue PRAFO return visit 1 month. She states some days when she gets up she does not have much hip pain she walks with her socks with therapy using a walker and will continue to work on ambulation. Return in 1 month to check her heels no x-ray of her hip needed on return.  Chief Complaint:  Chief Complaint  Patient presents with  . Left Hip - Follow-up   Visit Diagnoses:  1. Closed intertrochanteric fracture of hip, left, initial encounter ([place])   2. Ulcer of heel, unspecified laterality, unspecified ulcer stage ([place])     Plan: Again we discussed improved nutrition she states she is eating better and eating as best she can. Continue PRAFO. Return in 1 month for wound check of both heels continue dressing changes.  Follow-Up Instructions: No follow-ups on file.   Orders:  No orders of the defined types were placed in this encounter.  No orders of the defined types were placed in this encounter.   Imaging: No results found.  PMFS History: Patient Active Problem List   Diagnosis Date Noted  . Heel ulceration ([place]) 01/05/2020  . Protein-calorie malnutrition, severe 10/26/2019  . Closed intertrochanteric fracture of hip, left, initial encounter ([place]) 10/25/2019  . HTN (hypertension) 10/24/2019  . Radius and ulna distal fracture 01/16/2016   Past Medical History:  Diagnosis Date  . Arthritis    knee and back  . Cancer ([place])    melanoma on back  . Hypertension   . Osteoporosis     Family History  Problem Relation Age of Onset  . Heart disease Mother   .  Cancer Father     Past Surgical History:  Procedure Laterality Date  . APPENDECTOMY    . CARPAL TUNNEL RELEASE Right   . COLONOSCOPY    . FEMUR IM NAIL Left 10/25/2019   Procedure: INTRAMEDULLARY (IM) NAIL FEMORAL;  Surgeon: [name], MD;  [facility];  Service: Orthopedics;  Laterality: Left;  . HIP FRACTURE SURGERY Right   . OPEN REDUCTION INTERNAL FIXATION (ORIF) DISTAL RADIAL FRACTURE Left 01/16/2016   Procedure: OPEN REDUCTION INTERNAL FIXATION (ORIF) LEFT  DISTAL RADIUS FRACTURE WITH REPAIR RECONSTRUCTION AS NEEDED ;  Surgeon: Roseanne Kaufman, MD;  Location: [facility];  Service: Orthopedics;  Laterality: Left;  . TONSILLECTOMY     Social History   Occupational History  . Not on file  Tobacco Use  . Smoking status: Never Smoker  . Smokeless tobacco: Never Used  Substance and Sexual Activity  . Alcohol use: No  . Drug use: No  . Sexual activity: Not on file

## 2020-01-08 ENCOUNTER — Encounter: Payer: Self-pay | Admitting: Orthopedic Surgery

## 2020-01-08 ENCOUNTER — Other Ambulatory Visit: Payer: Self-pay

## 2020-01-08 ENCOUNTER — Ambulatory Visit (INDEPENDENT_AMBULATORY_CARE_PROVIDER_SITE_OTHER): Payer: Medicare Other | Admitting: Orthopedic Surgery

## 2020-01-08 VITALS — Ht 67.0 in | Wt 130.0 lb

## 2020-01-08 DIAGNOSIS — L97421 Non-pressure chronic ulcer of left heel and midfoot limited to breakdown of skin: Secondary | ICD-10-CM

## 2020-01-08 NOTE — Progress Notes (Signed)
Office Visit Note   Patient: Holly Salazar           Date of Birth: 1935/09/21           MRN: [id number] Visit Date: 01/08/2020              Requested by: Kathyrn Lass, [place],  [place] [address] PCP: Kathyrn Lass, MD  Chief Complaint  Patient presents with  . Right Foot - Wound Check  . Left Foot - Wound Check      HPI: This is a pleasant 84 year old woman who is status post ORIF hip fracture which is being followed by Dr. Lorin Mercy.  She is seeing [place] today in follow-up for her bilateral heel ulcers.  She has been in bilateral PRAFO boots which she states she wears all the time except when working with therapy  Assessment & Plan: Visit Diagnoses: No diagnosis found.  Plan: She will continue with PRAFO's and follow-up in 1 month.  Significant improvement.  Follow-Up Instructions: No follow-ups on file.   Ortho Exam  Patient is alert, oriented, no adenopathy, well-dressed, normal affect, normal respiratory effort. The right heel is completely healed good skin intact no surrounding erythema no surrounding ulcer no swelling.  She still has resolving ulcer on the left heel.  But there is scabbing over the area without any surrounding cellulitis or drainage.  Imaging: No results found. No images are attached to the encounter.  Labs: Lab Results  Component Value Date   REPTSTATUS 11/26/2019 FINAL 11/23/2019   CULT >=100,000 COLONIES/mL ESCHERICHIA COLI (A) 11/23/2019   LABORGA ESCHERICHIA COLI (A) 11/23/2019     Lab Results  Component Value Date   ALBUMIN 3.1 (L) 11/23/2019   ALBUMIN 3.4 (L) 10/26/2019    No results found for: MG No results found for: VD25OH  No results found for: PREALBUMIN CBC EXTENDED Latest Ref Rng & Units 11/23/2019 10/29/2019 10/28/2019  WBC 4.0 - 10.5 K/uL 7.2 - 7.3  RBC 3.87 - 5.11 MIL/uL 3.40(L) - 2.79(L)  HGB 12.0 - 15.0 g/dL 10.3(L) 8.6(L) 8.4(L)  HCT 36.0 - 46.0 % 32.7(L) 27.1(L) 26.5(L)  PLT 150 - 400 K/uL 380 - 168    NEUTROABS 1.7 - 7.7 K/uL 5.9 - -  LYMPHSABS 0.7 - 4.0 K/uL 0.9 - -     Body mass index is 20.36 kg/m.  Orders:  No orders of the defined types were placed in this encounter.  No orders of the defined types were placed in this encounter.    Procedures: No procedures performed  Clinical Data: No additional findings.  ROS:  All other systems negative, except as noted in the HPI. Review of Systems  Objective: Vital Signs: Ht 5\' 7"  (1.702 m)   Wt 130 lb (59 kg)   BMI 20.36 kg/m   Specialty Comments:  No specialty comments available.  PMFS History: Patient Active Problem List   Diagnosis Date Noted  . Heel ulceration ([place]) 01/05/2020  . Protein-calorie malnutrition, severe 10/26/2019  . Closed intertrochanteric fracture of hip, left, initial encounter ([place]) 10/25/2019  . HTN (hypertension) 10/24/2019  . Radius and ulna distal fracture 01/16/2016   Past Medical History:  Diagnosis Date  . Arthritis    knee and back  . Cancer ([place])    melanoma on back  . Hypertension   . Osteoporosis     Family History  Problem Relation Age of Onset  . Heart disease Mother   . Cancer Father  Past Surgical History:  Procedure Laterality Date  . APPENDECTOMY    . CARPAL TUNNEL RELEASE Right   . COLONOSCOPY    . FEMUR IM NAIL Left 10/25/2019   Procedure: INTRAMEDULLARY (IM) NAIL FEMORAL;  Surgeon: [name], MD;  [facility];  Service: Orthopedics;  Laterality: Left;  . HIP FRACTURE SURGERY Right   . OPEN REDUCTION INTERNAL FIXATION (ORIF) DISTAL RADIAL FRACTURE Left 01/16/2016   Procedure: OPEN REDUCTION INTERNAL FIXATION (ORIF) LEFT  DISTAL RADIUS FRACTURE WITH REPAIR RECONSTRUCTION AS NEEDED ;  Surgeon: Roseanne Kaufman, MD;  Location: [facility];  Service: Orthopedics;  Laterality: Left;  . TONSILLECTOMY     Social History   Occupational History  . Not on file  Tobacco Use  . Smoking status: Never Smoker  . Smokeless tobacco: Never Used  Substance and Sexual  Activity  . Alcohol use: No  . Drug use: No  . Sexual activity: Not on file

## 2020-01-15 DIAGNOSIS — L89620 Pressure ulcer of left heel, unstageable: Secondary | ICD-10-CM | POA: Diagnosis not present

## 2020-01-15 DIAGNOSIS — L89610 Pressure ulcer of right heel, unstageable: Secondary | ICD-10-CM | POA: Diagnosis not present

## 2020-01-22 DIAGNOSIS — L89610 Pressure ulcer of right heel, unstageable: Secondary | ICD-10-CM | POA: Diagnosis not present

## 2020-01-22 DIAGNOSIS — L89620 Pressure ulcer of left heel, unstageable: Secondary | ICD-10-CM | POA: Diagnosis not present

## 2020-01-24 ENCOUNTER — Ambulatory Visit: Payer: Medicare Other | Admitting: Family

## 2020-01-29 DIAGNOSIS — L89620 Pressure ulcer of left heel, unstageable: Secondary | ICD-10-CM | POA: Diagnosis not present

## 2020-01-29 DIAGNOSIS — L89610 Pressure ulcer of right heel, unstageable: Secondary | ICD-10-CM | POA: Diagnosis not present

## 2020-01-30 ENCOUNTER — Ambulatory Visit: Payer: Medicare Other | Admitting: Family

## 2020-01-30 ENCOUNTER — Encounter: Payer: Self-pay | Admitting: Family

## 2020-01-30 ENCOUNTER — Other Ambulatory Visit: Payer: Self-pay

## 2020-01-30 ENCOUNTER — Ambulatory Visit (INDEPENDENT_AMBULATORY_CARE_PROVIDER_SITE_OTHER): Payer: Medicare Other | Admitting: Physician Assistant

## 2020-01-30 VITALS — Ht 67.0 in | Wt 130.0 lb

## 2020-01-30 DIAGNOSIS — L97421 Non-pressure chronic ulcer of left heel and midfoot limited to breakdown of skin: Secondary | ICD-10-CM | POA: Diagnosis not present

## 2020-01-30 NOTE — Progress Notes (Signed)
Office Visit Note   Patient: Holly Salazar           Date of Birth: [birth year]           MRN: [id number] Visit Date: 01/30/2020              Requested by: Kathyrn Lass, [place],  [place] [address] PCP: Kathyrn Lass, MD  Chief Complaint  Patient presents with  . Left Foot - Follow-up  . Right Foot - Follow-up      HPI: This is a pleasant 84 year old woman who is here in follow-up today for her bilateral heel ulcers.  She is at a skilled care facility.  She does not have any pain in either of her heels.  She has a soft padded heel protector and PRAFO boots which she is in today  Assessment & Plan: Visit Diagnoses: No diagnosis found.  Plan: Follow-up in 1 month to reevaluate the left heel.  The right heel has completely resolved.  Follow-Up Instructions: No follow-ups on file.   Ortho Exam  Patient is alert, oriented, no adenopathy, well-dressed, normal affect, normal respiratory effort. Right heel no evidence of any ulcer swelling or erythema.  Skin is in good condition  Left heel she does have a 3 cm resolving eschar.  There is no surrounding erythema she has mild soft tissue swelling no other areas of pressure on this foot.  No foul odor no drainage  Imaging: No results found. No images are attached to the encounter.  Labs: Lab Results  Component Value Date   REPTSTATUS 11/26/2019 FINAL 11/23/2019   CULT >=100,000 COLONIES/mL ESCHERICHIA COLI (A) 11/23/2019   LABORGA ESCHERICHIA COLI (A) 11/23/2019     Lab Results  Component Value Date   ALBUMIN 3.1 (L) 11/23/2019   ALBUMIN 3.4 (L) 10/26/2019    No results found for: MG No results found for: VD25OH  No results found for: PREALBUMIN CBC EXTENDED Latest Ref Rng & Units 11/23/2019 10/29/2019 10/28/2019  WBC 4.0 - 10.5 K/uL 7.2 - 7.3  RBC 3.87 - 5.11 MIL/uL 3.40(L) - 2.79(L)  HGB 12.0 - 15.0 g/dL 10.3(L) 8.6(L) 8.4(L)  HCT 36.0 - 46.0 % 32.7(L) 27.1(L) 26.5(L)  PLT 150 - 400 K/uL 380 - 168    NEUTROABS 1.7 - 7.7 K/uL 5.9 - -  LYMPHSABS 0.7 - 4.0 K/uL 0.9 - -     Body mass index is 20.36 kg/m.  Orders:  No orders of the defined types were placed in this encounter.  No orders of the defined types were placed in this encounter.    Procedures: No procedures performed  Clinical Data: No additional findings.  ROS:  All other systems negative, except as noted in the HPI. Review of Systems  Objective: Vital Signs: Ht 5\' 7"  (1.702 m)   Wt 130 lb (59 kg)   BMI 20.36 kg/m   Specialty Comments:  No specialty comments available.  PMFS History: Patient Active Problem List   Diagnosis Date Noted  . Heel ulceration ([place]) 01/05/2020  . Protein-calorie malnutrition, severe 10/26/2019  . Closed intertrochanteric fracture of hip, left, initial encounter ([place]) 10/25/2019  . HTN (hypertension) 10/24/2019  . Radius and ulna distal fracture 01/16/2016   Past Medical History:  Diagnosis Date  . Arthritis    knee and back  . Cancer ([place])    melanoma on back  . Hypertension   . Osteoporosis     Family History  Problem Relation Age of Onset  .  Heart disease Mother   . Cancer Father     Past Surgical History:  Procedure Laterality Date  . APPENDECTOMY    . CARPAL TUNNEL RELEASE Right   . COLONOSCOPY    . FEMUR IM NAIL Left 10/25/2019   Procedure: INTRAMEDULLARY (IM) NAIL FEMORAL;  Surgeon: [name], MD;  [facility];  Service: Orthopedics;  Laterality: Left;  . HIP FRACTURE SURGERY Right   . OPEN REDUCTION INTERNAL FIXATION (ORIF) DISTAL RADIAL FRACTURE Left 01/16/2016   Procedure: OPEN REDUCTION INTERNAL FIXATION (ORIF) LEFT  DISTAL RADIUS FRACTURE WITH REPAIR RECONSTRUCTION AS NEEDED ;  Surgeon: Roseanne Kaufman, MD;  Location: [facility];  Service: Orthopedics;  Laterality: Left;  . TONSILLECTOMY     Social History   Occupational History  . Not on file  Tobacco Use  . Smoking status: Never Smoker  . Smokeless tobacco: Never Used  Substance and Sexual  Activity  . Alcohol use: No  . Drug use: No  . Sexual activity: Not on file

## 2020-02-01 ENCOUNTER — Telehealth: Payer: Self-pay | Admitting: Physician Assistant

## 2020-02-01 NOTE — Telephone Encounter (Signed)
Patient's niece Nelson Chimes asked for a call back  Concerning her mother's appointment on 01/30/2020.  Olin Hauser said she is the POA for her aunt. The number to contact Olin Hauser is [phone]

## 2020-02-02 ENCOUNTER — Ambulatory Visit: Payer: Medicare Other | Admitting: Family

## 2020-02-02 NOTE — Telephone Encounter (Signed)
I called and lm to advise to call with questions so that I can relay that to the provider and then call back with answers.

## 2020-02-05 DIAGNOSIS — L89610 Pressure ulcer of right heel, unstageable: Secondary | ICD-10-CM | POA: Diagnosis not present

## 2020-02-05 DIAGNOSIS — L89623 Pressure ulcer of left heel, stage 3: Secondary | ICD-10-CM | POA: Diagnosis not present

## 2020-02-06 ENCOUNTER — Ambulatory Visit (INDEPENDENT_AMBULATORY_CARE_PROVIDER_SITE_OTHER): Payer: Medicare Other | Admitting: Orthopaedic Surgery

## 2020-02-06 ENCOUNTER — Encounter: Payer: Self-pay | Admitting: Orthopaedic Surgery

## 2020-02-06 ENCOUNTER — Other Ambulatory Visit: Payer: Self-pay

## 2020-02-06 VITALS — BP 131/63 | HR 94 | Ht 67.0 in | Wt 130.0 lb

## 2020-02-06 DIAGNOSIS — S72142A Displaced intertrochanteric fracture of left femur, initial encounter for closed fracture: Secondary | ICD-10-CM

## 2020-02-06 NOTE — Progress Notes (Signed)
Office Visit Note   Patient: Holly Salazar           Date of Birth: 01/08/1936           MRN: [id number] Visit Date: 02/06/2020              Requested by: Kathyrn Lass, [place],  [place] [address] PCP: Kathyrn Lass, MD   Assessment & Plan: Visit Diagnoses:  1. Closed intertrochanteric fracture of hip, left, initial encounter [facility])     Plan: Patient's left hip fracture is healed treated with a short trochanteric nail.  Right heel ulcer has resolved with the left is still improving.  She has an appointment on 6/29 with Dr. Sharol Given.  She is working on increasing her ambulation with a goal of trying to do get back to independent living.  She is working hard on increased protein uptake.  She can follow-up with me as needed and is happy that her hip is no longer hurting.  Follow-Up Instructions: Return if symptoms worsen or fail to improve.   Orders:  No orders of the defined types were placed in this encounter.  No orders of the defined types were placed in this encounter.     Procedures: No procedures performed   Clinical Data: No additional findings.   Subjective: Chief Complaint  Patient presents with  . Left Hip - Follow-up    10/25/2019 IM Nail Left Hip    HPI follow-up intertrochanteric fracture.  She had problems with bilateral heel ulcers which is followed and treated by Dr. Sharol Given.  She is in [facility] right and left.  Right is healed left is healing.  She has appointment Dr. Sharol Given on 6/29.  Review of Systems unchanged.   Objective: Vital Signs: BP 131/63   Pulse 94   Ht 5\' 7"  (1.702 m)   Wt 130 lb (59 kg)   BMI 20.36 kg/m   Physical Exam Constitutional:      Appearance: She is well-developed.  HENT:     Head: Normocephalic.     Right Ear: External ear normal.     Left Ear: External ear normal.  Eyes:     Pupils: Pupils are equal, round, and reactive to light.  Neck:     Thyroid: No thyromegaly.     Trachea: No tracheal deviation.    Cardiovascular:     Rate and Rhythm: Normal rate.  Pulmonary:     Effort: Pulmonary effort is normal.  Abdominal:     Palpations: Abdomen is soft.  Skin:    General: Skin is warm and dry.  Neurological:     Mental Status: She is alert and oriented to person, place, and time.  Psychiatric:        Behavior: Behavior normal.     Ortho Exam hip incisions well-healed no pain with hip range of motion.  Specialty Comments:  No specialty comments available.  Imaging: No results found.   PMFS History: Patient Active Problem List   Diagnosis Date Noted  . Heel ulceration ([place]) 01/05/2020  . Protein-calorie malnutrition, severe 10/26/2019  . Closed intertrochanteric fracture of hip, left, initial encounter ([place]) 10/25/2019  . HTN (hypertension) 10/24/2019  . Radius and ulna distal fracture 01/16/2016   Past Medical History:  Diagnosis Date  . Arthritis    knee and back  . Cancer ([place])    melanoma on back  . Hypertension   . Osteoporosis     Family History  Problem Relation Age of  Onset  . Heart disease Mother   . Cancer Father     Past Surgical History:  Procedure Laterality Date  . APPENDECTOMY    . CARPAL TUNNEL RELEASE Right   . COLONOSCOPY    . FEMUR IM NAIL Left 10/25/2019   Procedure: INTRAMEDULLARY (IM) NAIL FEMORAL;  Surgeon: [name], MD;  [facility];  Service: Orthopedics;  Laterality: Left;  . HIP FRACTURE SURGERY Right   . OPEN REDUCTION INTERNAL FIXATION (ORIF) DISTAL RADIAL FRACTURE Left 01/16/2016   Procedure: OPEN REDUCTION INTERNAL FIXATION (ORIF) LEFT  DISTAL RADIUS FRACTURE WITH REPAIR RECONSTRUCTION AS NEEDED ;  Surgeon: Roseanne Kaufman, MD;  Location: [facility] [facility];  Service: Orthopedics;  Laterality: Left;  . TONSILLECTOMY     Social History   Occupational History  . Not on file  Tobacco Use  . Smoking status: Never Smoker  . Smokeless tobacco: Never Used  Substance and Sexual Activity  . Alcohol use: No  . Drug use: No  . Sexual  activity: Not on file

## 2020-02-08 ENCOUNTER — Ambulatory Visit: Payer: Medicare Other | Admitting: Physician Assistant

## 2020-02-16 ENCOUNTER — Other Ambulatory Visit: Payer: Self-pay | Admitting: *Deleted

## 2020-02-16 NOTE — Patient Outreach (Signed)
[facility] Post- Acute Care Coordinator follow up  [name] remains in [facility] under private pay. She has exhausted her 100 SNF Medicare days benefit.  No identifiable [facility] Care Management needs at this time.  [name], MSN-Ed, RN,BSN [place] Acute Care Coordinator [phone] [facility]) [phone]  (Toll free office)

## 2020-02-20 ENCOUNTER — Encounter: Payer: Self-pay | Admitting: Physician Assistant

## 2020-02-20 ENCOUNTER — Other Ambulatory Visit: Payer: Self-pay

## 2020-02-20 ENCOUNTER — Ambulatory Visit (INDEPENDENT_AMBULATORY_CARE_PROVIDER_SITE_OTHER): Payer: Medicare Other | Admitting: Physician Assistant

## 2020-02-20 DIAGNOSIS — L97411 Non-pressure chronic ulcer of right heel and midfoot limited to breakdown of skin: Secondary | ICD-10-CM | POA: Diagnosis not present

## 2020-02-20 NOTE — Progress Notes (Signed)
Office Visit Note   Patient: Holly Salazar           Date of Birth: [birth year]           MRN: [id number] Visit Date: 02/20/2020              Requested by: Kathyrn Lass, [place],  [place] [id number] PCP: Kathyrn Lass, MD  No chief complaint on file.     HPI: This is a pleasant 84 year old woman who is status post ORIF of the hip fracture by Dr. Lorin Mercy.  She has been at a nursing home and doing fairly well.  She has been followed by [place] for bilateral pressure ulcers on her heels.  She is accompanied by her niece.  She is wearing PRAFO boots.  Her niece is hoping to get her to a assisted living as soon as the heel ulcers are at stage III or better  Assessment & Plan: Visit Diagnoses:  1. Non-pressure chronic ulcer of right heel and midfoot limited to breakdown of skin ([place])     Plan: We briefly discussed vive compression stockings.  The niece is worried about the consistency in the nursing home being able to apply these correctly.  Her aunt is also unable to apply them herself which would be difficult in assisted living.  We will continue with PRAFO boots.  She will follow-up in 2 weeks.  Hopefully the left ulcer could be staged if appropriate  Follow-Up Instructions: No follow-ups on file.   Ortho Exam  Patient is alert, oriented, no adenopathy, well-dressed, normal affect, normal respiratory effort. Right heel.  No cellulitis no drainage skin is intact the ulcer has resolved. Left heel.  She does have about a 2 cm diameter ulcer that is full-thickness through the skin.  There is no surrounding cellulitis there is some scant serous drainage.  No foul odor.  Would stage this is a stage III  Imaging: No results found. No images are attached to the encounter.  Labs: Lab Results  Component Value Date   REPTSTATUS 11/26/2019 FINAL 11/23/2019   CULT >=100,000 COLONIES/mL ESCHERICHIA COLI (A) 11/23/2019   LABORGA ESCHERICHIA COLI (A) 11/23/2019     Lab Results    Component Value Date   ALBUMIN 3.1 (L) 11/23/2019   ALBUMIN 3.4 (L) 10/26/2019    No results found for: MG No results found for: VD25OH  No results found for: PREALBUMIN CBC EXTENDED Latest Ref Rng & Units 11/23/2019 10/29/2019 10/28/2019  WBC 4.0 - 10.5 K/uL 7.2 - 7.3  RBC 3.87 - 5.11 MIL/uL 3.40(L) - 2.79(L)  HGB 12.0 - 15.0 g/dL 10.3(L) 8.6(L) 8.4(L)  HCT 36 - 46 % 32.7(L) 27.1(L) 26.5(L)  PLT 150 - 400 K/uL 380 - 168  NEUTROABS 1.7 - 7.7 K/uL 5.9 - -  LYMPHSABS 0.7 - 4.0 K/uL 0.9 - -     There is no height or weight on file to calculate BMI.  Orders:  No orders of the defined types were placed in this encounter.  No orders of the defined types were placed in this encounter.    Procedures: No procedures performed  Clinical Data: No additional findings.  ROS:  All other systems negative, except as noted in the HPI. Review of Systems  Objective: Vital Signs: There were no vitals taken for this visit.  Specialty Comments:  No specialty comments available.  PMFS History: Patient Active Problem List   Diagnosis Date Noted  . Heel ulceration ([place]) 01/05/2020  .  Protein-calorie malnutrition, severe 10/26/2019  . Closed intertrochanteric fracture of hip, left, initial encounter ([place]) 10/25/2019  . HTN (hypertension) 10/24/2019  . Radius and ulna distal fracture 01/16/2016   Past Medical History:  Diagnosis Date  . Arthritis    knee and back  . Cancer ([place])    melanoma on back  . Hypertension   . Osteoporosis     Family History  Problem Relation Age of Onset  . Heart disease Mother   . Cancer Father     Past Surgical History:  Procedure Laterality Date  . APPENDECTOMY    . CARPAL TUNNEL RELEASE Right   . COLONOSCOPY    . FEMUR IM NAIL Left 10/25/2019   Procedure: INTRAMEDULLARY (IM) NAIL FEMORAL;  Surgeon: [name], MD;  Location: [facility];  Service: Orthopedics;  Laterality: Left;  . HIP FRACTURE SURGERY Right   . OPEN REDUCTION INTERNAL FIXATION  (ORIF) DISTAL RADIAL FRACTURE Left 01/16/2016   Procedure: OPEN REDUCTION INTERNAL FIXATION (ORIF) LEFT  DISTAL RADIUS FRACTURE WITH REPAIR RECONSTRUCTION AS NEEDED ;  Surgeon: Roseanne Kaufman, MD;  Location: [place];  Service: Orthopedics;  Laterality: Left;  . TONSILLECTOMY     Social History   Occupational History  . Not on file  Tobacco Use  . Smoking status: Never Smoker  . Smokeless tobacco: Never Used  Substance and Sexual Activity  . Alcohol use: No  . Drug use: No  . Sexual activity: Not on file

## 2020-03-07 ENCOUNTER — Other Ambulatory Visit: Payer: Self-pay

## 2020-03-07 ENCOUNTER — Ambulatory Visit (INDEPENDENT_AMBULATORY_CARE_PROVIDER_SITE_OTHER): Payer: Medicare Other | Admitting: Orthopedic Surgery

## 2020-03-07 ENCOUNTER — Encounter: Payer: Self-pay | Admitting: Orthopedic Surgery

## 2020-03-07 DIAGNOSIS — L97411 Non-pressure chronic ulcer of right heel and midfoot limited to breakdown of skin: Secondary | ICD-10-CM

## 2020-03-07 DIAGNOSIS — L97421 Non-pressure chronic ulcer of left heel and midfoot limited to breakdown of skin: Secondary | ICD-10-CM | POA: Diagnosis not present

## 2020-03-08 DIAGNOSIS — L89623 Pressure ulcer of left heel, stage 3: Secondary | ICD-10-CM | POA: Diagnosis not present

## 2020-03-08 DIAGNOSIS — Z029 Encounter for administrative examinations, unspecified: Secondary | ICD-10-CM | POA: Diagnosis not present

## 2020-03-08 DIAGNOSIS — L89610 Pressure ulcer of right heel, unstageable: Secondary | ICD-10-CM | POA: Diagnosis not present

## 2020-03-08 DIAGNOSIS — S72002D Fracture of unspecified part of neck of left femur, subsequent encounter for closed fracture with routine healing: Secondary | ICD-10-CM | POA: Diagnosis not present

## 2020-03-11 DIAGNOSIS — L89623 Pressure ulcer of left heel, stage 3: Secondary | ICD-10-CM | POA: Diagnosis not present

## 2020-03-14 DIAGNOSIS — R339 Retention of urine, unspecified: Secondary | ICD-10-CM | POA: Diagnosis not present

## 2020-03-14 DIAGNOSIS — N368 Other specified disorders of urethra: Secondary | ICD-10-CM | POA: Diagnosis not present

## 2020-03-14 DIAGNOSIS — R300 Dysuria: Secondary | ICD-10-CM | POA: Diagnosis not present

## 2020-03-15 DIAGNOSIS — N390 Urinary tract infection, site not specified: Secondary | ICD-10-CM | POA: Diagnosis not present

## 2020-03-15 DIAGNOSIS — Z79899 Other long term (current) drug therapy: Secondary | ICD-10-CM | POA: Diagnosis not present

## 2020-03-18 DIAGNOSIS — N390 Urinary tract infection, site not specified: Secondary | ICD-10-CM | POA: Diagnosis not present

## 2020-03-18 DIAGNOSIS — L89623 Pressure ulcer of left heel, stage 3: Secondary | ICD-10-CM | POA: Diagnosis not present

## 2020-03-20 DIAGNOSIS — R4589 Other symptoms and signs involving emotional state: Secondary | ICD-10-CM | POA: Diagnosis not present

## 2020-03-20 DIAGNOSIS — F329 Major depressive disorder, single episode, unspecified: Secondary | ICD-10-CM | POA: Diagnosis not present

## 2020-03-20 DIAGNOSIS — S7222XD Displaced subtrochanteric fracture of left femur, subsequent encounter for closed fracture with routine healing: Secondary | ICD-10-CM | POA: Diagnosis not present

## 2020-03-20 DIAGNOSIS — F22 Delusional disorders: Secondary | ICD-10-CM | POA: Diagnosis not present

## 2020-03-20 DIAGNOSIS — R45851 Suicidal ideations: Secondary | ICD-10-CM | POA: Diagnosis not present

## 2020-03-20 DIAGNOSIS — N368 Other specified disorders of urethra: Secondary | ICD-10-CM | POA: Diagnosis not present

## 2020-03-20 DIAGNOSIS — F419 Anxiety disorder, unspecified: Secondary | ICD-10-CM | POA: Diagnosis not present

## 2020-03-20 DIAGNOSIS — R300 Dysuria: Secondary | ICD-10-CM | POA: Diagnosis not present

## 2020-03-21 DIAGNOSIS — E46 Unspecified protein-calorie malnutrition: Secondary | ICD-10-CM | POA: Diagnosis not present

## 2020-03-21 DIAGNOSIS — I951 Orthostatic hypotension: Secondary | ICD-10-CM | POA: Diagnosis not present

## 2020-03-21 DIAGNOSIS — F39 Unspecified mood [affective] disorder: Secondary | ICD-10-CM | POA: Diagnosis not present

## 2020-03-21 DIAGNOSIS — R262 Difficulty in walking, not elsewhere classified: Secondary | ICD-10-CM | POA: Diagnosis not present

## 2020-03-22 DIAGNOSIS — R4589 Other symptoms and signs involving emotional state: Secondary | ICD-10-CM | POA: Diagnosis not present

## 2020-03-22 DIAGNOSIS — F329 Major depressive disorder, single episode, unspecified: Secondary | ICD-10-CM | POA: Diagnosis not present

## 2020-03-22 DIAGNOSIS — F419 Anxiety disorder, unspecified: Secondary | ICD-10-CM | POA: Diagnosis not present

## 2020-03-25 ENCOUNTER — Encounter: Payer: Self-pay | Admitting: Orthopedic Surgery

## 2020-03-25 DIAGNOSIS — F329 Major depressive disorder, single episode, unspecified: Secondary | ICD-10-CM | POA: Diagnosis not present

## 2020-03-25 DIAGNOSIS — L89623 Pressure ulcer of left heel, stage 3: Secondary | ICD-10-CM | POA: Diagnosis not present

## 2020-03-25 DIAGNOSIS — F419 Anxiety disorder, unspecified: Secondary | ICD-10-CM | POA: Diagnosis not present

## 2020-03-25 NOTE — Progress Notes (Signed)
Office Visit Note   Patient: Holly Salazar           Date of Birth: [birth year]           MRN: [id number] Visit Date: 03/07/2020              Requested by: Kathyrn Lass, [place],  [place] [id number] PCP: Kathyrn Lass, MD  Chief Complaint  Patient presents with  . Right Foot - Pain  . Left Foot - Pain      HPI: Patient is an 84 year old woman who presents with her power of attorney Holly Salazar.  Patient seen in follow-up for decubitus heel ulcers.  Assessment & Plan: Visit Diagnoses:  1. Non-pressure chronic ulcer of right heel and midfoot limited to breakdown of skin ([place])   2. Non-pressure chronic ulcer of left heel and midfoot limited to breakdown of skin ([place])     Plan: Patient's ulcers have healed well.  Discussed that if patient has appropriate assistance that she would be able to advance to assisted living recommended physical therapy weightbearing as tolerated bilateral lower extremities.  Follow-Up Instructions: Return if symptoms worsen or fail to improve.   Ortho Exam  Patient is alert, oriented, no adenopathy, well-dressed, normal affect, normal respiratory effort. Examination of the right heel ulcer has completely resolved there is good epithelization no redness no cellulitis no drainage no ischemic changes.  The left heel ulcer is almost completely healed there is an area of granulation tissue that is 5 mm diameter 0.1 mm deep no ischemic changes no cellulitis no drainage no odor no signs of infection.  Imaging: No results found. No images are attached to the encounter.  Labs: Lab Results  Component Value Date   REPTSTATUS 11/26/2019 FINAL 11/23/2019   CULT >=100,000 COLONIES/mL ESCHERICHIA COLI (A) 11/23/2019   LABORGA ESCHERICHIA COLI (A) 11/23/2019     Lab Results  Component Value Date   ALBUMIN 3.1 (L) 11/23/2019   ALBUMIN 3.4 (L) 10/26/2019    No results found for: MG No results found for: VD25OH  No results found for:  PREALBUMIN CBC EXTENDED Latest Ref Rng & Units 11/23/2019 10/29/2019 10/28/2019  WBC 4.0 - 10.5 K/uL 7.2 - 7.3  RBC 3.87 - 5.11 MIL/uL 3.40(L) - 2.79(L)  HGB 12.0 - 15.0 g/dL 10.3(L) 8.6(L) 8.4(L)  HCT 36 - 46 % 32.7(L) 27.1(L) 26.5(L)  PLT 150 - 400 K/uL 380 - 168  NEUTROABS 1.7 - 7.7 K/uL 5.9 - -  LYMPHSABS 0.7 - 4.0 K/uL 0.9 - -     There is no height or weight on file to calculate BMI.  Orders:  No orders of the defined types were placed in this encounter.  No orders of the defined types were placed in this encounter.    Procedures: No procedures performed  Clinical Data: No additional findings.  ROS:  All other systems negative, except as noted in the HPI. Review of Systems  Objective: Vital Signs: There were no vitals taken for this visit.  Specialty Comments:  No specialty comments available.  PMFS History: Patient Active Problem List   Diagnosis Date Noted  . Heel ulceration ([place]) 01/05/2020  . Protein-calorie malnutrition, severe 10/26/2019  . Closed intertrochanteric fracture of hip, left, initial encounter ([place]) 10/25/2019  . HTN (hypertension) 10/24/2019  . Radius and ulna distal fracture 01/16/2016   Past Medical History:  Diagnosis Date  . Arthritis    knee and back  . Cancer ([place])    melanoma on  back  . Hypertension   . Osteoporosis     Family History  Problem Relation Age of Onset  . Heart disease Mother   . Cancer Father     Past Surgical History:  Procedure Laterality Date  . APPENDECTOMY    . CARPAL TUNNEL RELEASE Right   . COLONOSCOPY    . FEMUR IM NAIL Left 10/25/2019   Procedure: INTRAMEDULLARY (IM) NAIL FEMORAL;  Surgeon: [name], MD;  Location: [facility];  Service: Orthopedics;  Laterality: Left;  . HIP FRACTURE SURGERY Right   . OPEN REDUCTION INTERNAL FIXATION (ORIF) DISTAL RADIAL FRACTURE Left 01/16/2016   Procedure: OPEN REDUCTION INTERNAL FIXATION (ORIF) LEFT  DISTAL RADIUS FRACTURE WITH REPAIR RECONSTRUCTION AS NEEDED ;   Surgeon: Roseanne Kaufman, MD;  Location: [place];  Service: Orthopedics;  Laterality: Left;  . TONSILLECTOMY     Social History   Occupational History  . Not on file  Tobacco Use  . Smoking status: Never Smoker  . Smokeless tobacco: Never Used  Substance and Sexual Activity  . Alcohol use: No  . Drug use: No  . Sexual activity: Not on file

## 2020-03-26 DIAGNOSIS — F329 Major depressive disorder, single episode, unspecified: Secondary | ICD-10-CM | POA: Diagnosis not present

## 2020-03-26 DIAGNOSIS — R4589 Other symptoms and signs involving emotional state: Secondary | ICD-10-CM | POA: Diagnosis not present

## 2020-03-26 DIAGNOSIS — F419 Anxiety disorder, unspecified: Secondary | ICD-10-CM | POA: Diagnosis not present

## 2020-03-29 DIAGNOSIS — Z20822 Contact with and (suspected) exposure to covid-19: Secondary | ICD-10-CM | POA: Diagnosis not present

## 2020-04-01 DIAGNOSIS — L89623 Pressure ulcer of left heel, stage 3: Secondary | ICD-10-CM | POA: Diagnosis not present

## 2020-04-04 DIAGNOSIS — Z20822 Contact with and (suspected) exposure to covid-19: Secondary | ICD-10-CM | POA: Diagnosis not present

## 2020-04-10 DIAGNOSIS — Z20822 Contact with and (suspected) exposure to covid-19: Secondary | ICD-10-CM | POA: Diagnosis not present

## 2020-04-12 DIAGNOSIS — L89623 Pressure ulcer of left heel, stage 3: Secondary | ICD-10-CM | POA: Diagnosis not present

## 2020-04-12 DIAGNOSIS — R635 Abnormal weight gain: Secondary | ICD-10-CM | POA: Diagnosis not present

## 2020-04-12 DIAGNOSIS — F39 Unspecified mood [affective] disorder: Secondary | ICD-10-CM | POA: Diagnosis not present

## 2020-04-15 DIAGNOSIS — L89623 Pressure ulcer of left heel, stage 3: Secondary | ICD-10-CM | POA: Diagnosis not present

## 2020-04-15 DIAGNOSIS — Z20822 Contact with and (suspected) exposure to covid-19: Secondary | ICD-10-CM | POA: Diagnosis not present

## 2020-04-20 DIAGNOSIS — Z20822 Contact with and (suspected) exposure to covid-19: Secondary | ICD-10-CM | POA: Diagnosis not present

## 2020-04-22 DIAGNOSIS — Z20822 Contact with and (suspected) exposure to covid-19: Secondary | ICD-10-CM | POA: Diagnosis not present

## 2020-04-23 DIAGNOSIS — Z4789 Encounter for other orthopedic aftercare: Secondary | ICD-10-CM | POA: Diagnosis not present

## 2020-04-23 DIAGNOSIS — S72142S Displaced intertrochanteric fracture of left femur, sequela: Secondary | ICD-10-CM | POA: Diagnosis not present

## 2020-04-23 DIAGNOSIS — R2689 Other abnormalities of gait and mobility: Secondary | ICD-10-CM | POA: Diagnosis not present

## 2020-04-23 DIAGNOSIS — R41841 Cognitive communication deficit: Secondary | ICD-10-CM | POA: Diagnosis not present

## 2020-04-23 DIAGNOSIS — R262 Difficulty in walking, not elsewhere classified: Secondary | ICD-10-CM | POA: Diagnosis not present

## 2020-04-23 DIAGNOSIS — M6281 Muscle weakness (generalized): Secondary | ICD-10-CM | POA: Diagnosis not present

## 2020-04-23 DIAGNOSIS — Z9181 History of falling: Secondary | ICD-10-CM | POA: Diagnosis not present

## 2020-04-23 DIAGNOSIS — R2681 Unsteadiness on feet: Secondary | ICD-10-CM | POA: Diagnosis not present

## 2020-04-24 DIAGNOSIS — R2681 Unsteadiness on feet: Secondary | ICD-10-CM | POA: Diagnosis not present

## 2020-04-24 DIAGNOSIS — R41841 Cognitive communication deficit: Secondary | ICD-10-CM | POA: Diagnosis not present

## 2020-04-24 DIAGNOSIS — M6281 Muscle weakness (generalized): Secondary | ICD-10-CM | POA: Diagnosis not present

## 2020-04-24 DIAGNOSIS — R2689 Other abnormalities of gait and mobility: Secondary | ICD-10-CM | POA: Diagnosis not present

## 2020-04-24 DIAGNOSIS — Z4789 Encounter for other orthopedic aftercare: Secondary | ICD-10-CM | POA: Diagnosis not present

## 2020-04-24 DIAGNOSIS — S72142S Displaced intertrochanteric fracture of left femur, sequela: Secondary | ICD-10-CM | POA: Diagnosis not present

## 2020-04-24 DIAGNOSIS — Z9181 History of falling: Secondary | ICD-10-CM | POA: Diagnosis not present

## 2020-04-24 DIAGNOSIS — R262 Difficulty in walking, not elsewhere classified: Secondary | ICD-10-CM | POA: Diagnosis not present

## 2020-04-25 DIAGNOSIS — Z4789 Encounter for other orthopedic aftercare: Secondary | ICD-10-CM | POA: Diagnosis not present

## 2020-04-25 DIAGNOSIS — S72142S Displaced intertrochanteric fracture of left femur, sequela: Secondary | ICD-10-CM | POA: Diagnosis not present

## 2020-04-25 DIAGNOSIS — M6281 Muscle weakness (generalized): Secondary | ICD-10-CM | POA: Diagnosis not present

## 2020-04-25 DIAGNOSIS — R262 Difficulty in walking, not elsewhere classified: Secondary | ICD-10-CM | POA: Diagnosis not present

## 2020-04-25 DIAGNOSIS — R2689 Other abnormalities of gait and mobility: Secondary | ICD-10-CM | POA: Diagnosis not present

## 2020-04-25 DIAGNOSIS — Z20822 Contact with and (suspected) exposure to covid-19: Secondary | ICD-10-CM | POA: Diagnosis not present

## 2020-04-25 DIAGNOSIS — R2681 Unsteadiness on feet: Secondary | ICD-10-CM | POA: Diagnosis not present

## 2020-04-26 DIAGNOSIS — R262 Difficulty in walking, not elsewhere classified: Secondary | ICD-10-CM | POA: Diagnosis not present

## 2020-04-26 DIAGNOSIS — R2681 Unsteadiness on feet: Secondary | ICD-10-CM | POA: Diagnosis not present

## 2020-04-26 DIAGNOSIS — S72142S Displaced intertrochanteric fracture of left femur, sequela: Secondary | ICD-10-CM | POA: Diagnosis not present

## 2020-04-26 DIAGNOSIS — R2689 Other abnormalities of gait and mobility: Secondary | ICD-10-CM | POA: Diagnosis not present

## 2020-04-26 DIAGNOSIS — M6281 Muscle weakness (generalized): Secondary | ICD-10-CM | POA: Diagnosis not present

## 2020-04-26 DIAGNOSIS — Z4789 Encounter for other orthopedic aftercare: Secondary | ICD-10-CM | POA: Diagnosis not present

## 2020-04-29 DIAGNOSIS — Z4789 Encounter for other orthopedic aftercare: Secondary | ICD-10-CM | POA: Diagnosis not present

## 2020-04-29 DIAGNOSIS — R2689 Other abnormalities of gait and mobility: Secondary | ICD-10-CM | POA: Diagnosis not present

## 2020-04-29 DIAGNOSIS — S72142S Displaced intertrochanteric fracture of left femur, sequela: Secondary | ICD-10-CM | POA: Diagnosis not present

## 2020-04-29 DIAGNOSIS — M6281 Muscle weakness (generalized): Secondary | ICD-10-CM | POA: Diagnosis not present

## 2020-04-29 DIAGNOSIS — R262 Difficulty in walking, not elsewhere classified: Secondary | ICD-10-CM | POA: Diagnosis not present

## 2020-04-29 DIAGNOSIS — R2681 Unsteadiness on feet: Secondary | ICD-10-CM | POA: Diagnosis not present

## 2020-04-30 DIAGNOSIS — M6281 Muscle weakness (generalized): Secondary | ICD-10-CM | POA: Diagnosis not present

## 2020-04-30 DIAGNOSIS — R2681 Unsteadiness on feet: Secondary | ICD-10-CM | POA: Diagnosis not present

## 2020-04-30 DIAGNOSIS — Z4789 Encounter for other orthopedic aftercare: Secondary | ICD-10-CM | POA: Diagnosis not present

## 2020-04-30 DIAGNOSIS — R2689 Other abnormalities of gait and mobility: Secondary | ICD-10-CM | POA: Diagnosis not present

## 2020-04-30 DIAGNOSIS — S72142S Displaced intertrochanteric fracture of left femur, sequela: Secondary | ICD-10-CM | POA: Diagnosis not present

## 2020-04-30 DIAGNOSIS — R262 Difficulty in walking, not elsewhere classified: Secondary | ICD-10-CM | POA: Diagnosis not present

## 2020-05-01 DIAGNOSIS — S72142S Displaced intertrochanteric fracture of left femur, sequela: Secondary | ICD-10-CM | POA: Diagnosis not present

## 2020-05-01 DIAGNOSIS — R2689 Other abnormalities of gait and mobility: Secondary | ICD-10-CM | POA: Diagnosis not present

## 2020-05-01 DIAGNOSIS — M6281 Muscle weakness (generalized): Secondary | ICD-10-CM | POA: Diagnosis not present

## 2020-05-01 DIAGNOSIS — R2681 Unsteadiness on feet: Secondary | ICD-10-CM | POA: Diagnosis not present

## 2020-05-01 DIAGNOSIS — Z4789 Encounter for other orthopedic aftercare: Secondary | ICD-10-CM | POA: Diagnosis not present

## 2020-05-01 DIAGNOSIS — R262 Difficulty in walking, not elsewhere classified: Secondary | ICD-10-CM | POA: Diagnosis not present

## 2020-05-02 DIAGNOSIS — R2681 Unsteadiness on feet: Secondary | ICD-10-CM | POA: Diagnosis not present

## 2020-05-02 DIAGNOSIS — R262 Difficulty in walking, not elsewhere classified: Secondary | ICD-10-CM | POA: Diagnosis not present

## 2020-05-02 DIAGNOSIS — S72142S Displaced intertrochanteric fracture of left femur, sequela: Secondary | ICD-10-CM | POA: Diagnosis not present

## 2020-05-02 DIAGNOSIS — M6281 Muscle weakness (generalized): Secondary | ICD-10-CM | POA: Diagnosis not present

## 2020-05-02 DIAGNOSIS — Z03818 Encounter for observation for suspected exposure to other biological agents ruled out: Secondary | ICD-10-CM | POA: Diagnosis not present

## 2020-05-02 DIAGNOSIS — R2689 Other abnormalities of gait and mobility: Secondary | ICD-10-CM | POA: Diagnosis not present

## 2020-05-02 DIAGNOSIS — Z4789 Encounter for other orthopedic aftercare: Secondary | ICD-10-CM | POA: Diagnosis not present

## 2020-05-03 DIAGNOSIS — M6281 Muscle weakness (generalized): Secondary | ICD-10-CM | POA: Diagnosis not present

## 2020-05-03 DIAGNOSIS — R2681 Unsteadiness on feet: Secondary | ICD-10-CM | POA: Diagnosis not present

## 2020-05-03 DIAGNOSIS — R2689 Other abnormalities of gait and mobility: Secondary | ICD-10-CM | POA: Diagnosis not present

## 2020-05-03 DIAGNOSIS — R262 Difficulty in walking, not elsewhere classified: Secondary | ICD-10-CM | POA: Diagnosis not present

## 2020-05-03 DIAGNOSIS — S72142S Displaced intertrochanteric fracture of left femur, sequela: Secondary | ICD-10-CM | POA: Diagnosis not present

## 2020-05-03 DIAGNOSIS — Z4789 Encounter for other orthopedic aftercare: Secondary | ICD-10-CM | POA: Diagnosis not present

## 2020-05-06 DIAGNOSIS — R262 Difficulty in walking, not elsewhere classified: Secondary | ICD-10-CM | POA: Diagnosis not present

## 2020-05-06 DIAGNOSIS — S72142S Displaced intertrochanteric fracture of left femur, sequela: Secondary | ICD-10-CM | POA: Diagnosis not present

## 2020-05-06 DIAGNOSIS — R2681 Unsteadiness on feet: Secondary | ICD-10-CM | POA: Diagnosis not present

## 2020-05-06 DIAGNOSIS — M6281 Muscle weakness (generalized): Secondary | ICD-10-CM | POA: Diagnosis not present

## 2020-05-06 DIAGNOSIS — R2689 Other abnormalities of gait and mobility: Secondary | ICD-10-CM | POA: Diagnosis not present

## 2020-05-06 DIAGNOSIS — Z4789 Encounter for other orthopedic aftercare: Secondary | ICD-10-CM | POA: Diagnosis not present

## 2020-05-07 DIAGNOSIS — R262 Difficulty in walking, not elsewhere classified: Secondary | ICD-10-CM | POA: Diagnosis not present

## 2020-05-07 DIAGNOSIS — M6281 Muscle weakness (generalized): Secondary | ICD-10-CM | POA: Diagnosis not present

## 2020-05-07 DIAGNOSIS — Z4789 Encounter for other orthopedic aftercare: Secondary | ICD-10-CM | POA: Diagnosis not present

## 2020-05-07 DIAGNOSIS — R2681 Unsteadiness on feet: Secondary | ICD-10-CM | POA: Diagnosis not present

## 2020-05-07 DIAGNOSIS — R2689 Other abnormalities of gait and mobility: Secondary | ICD-10-CM | POA: Diagnosis not present

## 2020-05-07 DIAGNOSIS — S72142S Displaced intertrochanteric fracture of left femur, sequela: Secondary | ICD-10-CM | POA: Diagnosis not present

## 2020-05-07 DIAGNOSIS — Z20822 Contact with and (suspected) exposure to covid-19: Secondary | ICD-10-CM | POA: Diagnosis not present

## 2020-05-08 DIAGNOSIS — R2689 Other abnormalities of gait and mobility: Secondary | ICD-10-CM | POA: Diagnosis not present

## 2020-05-08 DIAGNOSIS — Z4789 Encounter for other orthopedic aftercare: Secondary | ICD-10-CM | POA: Diagnosis not present

## 2020-05-08 DIAGNOSIS — M6281 Muscle weakness (generalized): Secondary | ICD-10-CM | POA: Diagnosis not present

## 2020-05-08 DIAGNOSIS — R2681 Unsteadiness on feet: Secondary | ICD-10-CM | POA: Diagnosis not present

## 2020-05-08 DIAGNOSIS — R262 Difficulty in walking, not elsewhere classified: Secondary | ICD-10-CM | POA: Diagnosis not present

## 2020-05-08 DIAGNOSIS — S72142S Displaced intertrochanteric fracture of left femur, sequela: Secondary | ICD-10-CM | POA: Diagnosis not present

## 2020-05-09 DIAGNOSIS — S72142S Displaced intertrochanteric fracture of left femur, sequela: Secondary | ICD-10-CM | POA: Diagnosis not present

## 2020-05-09 DIAGNOSIS — M6281 Muscle weakness (generalized): Secondary | ICD-10-CM | POA: Diagnosis not present

## 2020-05-09 DIAGNOSIS — R262 Difficulty in walking, not elsewhere classified: Secondary | ICD-10-CM | POA: Diagnosis not present

## 2020-05-09 DIAGNOSIS — Z4789 Encounter for other orthopedic aftercare: Secondary | ICD-10-CM | POA: Diagnosis not present

## 2020-05-09 DIAGNOSIS — R2681 Unsteadiness on feet: Secondary | ICD-10-CM | POA: Diagnosis not present

## 2020-05-09 DIAGNOSIS — Z20822 Contact with and (suspected) exposure to covid-19: Secondary | ICD-10-CM | POA: Diagnosis not present

## 2020-05-09 DIAGNOSIS — R2689 Other abnormalities of gait and mobility: Secondary | ICD-10-CM | POA: Diagnosis not present

## 2020-05-10 DIAGNOSIS — R2681 Unsteadiness on feet: Secondary | ICD-10-CM | POA: Diagnosis not present

## 2020-05-10 DIAGNOSIS — M6281 Muscle weakness (generalized): Secondary | ICD-10-CM | POA: Diagnosis not present

## 2020-05-10 DIAGNOSIS — R262 Difficulty in walking, not elsewhere classified: Secondary | ICD-10-CM | POA: Diagnosis not present

## 2020-05-10 DIAGNOSIS — Z4789 Encounter for other orthopedic aftercare: Secondary | ICD-10-CM | POA: Diagnosis not present

## 2020-05-10 DIAGNOSIS — R2689 Other abnormalities of gait and mobility: Secondary | ICD-10-CM | POA: Diagnosis not present

## 2020-05-10 DIAGNOSIS — S72142S Displaced intertrochanteric fracture of left femur, sequela: Secondary | ICD-10-CM | POA: Diagnosis not present

## 2020-05-13 DIAGNOSIS — S72142S Displaced intertrochanteric fracture of left femur, sequela: Secondary | ICD-10-CM | POA: Diagnosis not present

## 2020-05-13 DIAGNOSIS — R262 Difficulty in walking, not elsewhere classified: Secondary | ICD-10-CM | POA: Diagnosis not present

## 2020-05-13 DIAGNOSIS — F331 Major depressive disorder, recurrent, moderate: Secondary | ICD-10-CM | POA: Diagnosis not present

## 2020-05-13 DIAGNOSIS — M6281 Muscle weakness (generalized): Secondary | ICD-10-CM | POA: Diagnosis not present

## 2020-05-13 DIAGNOSIS — R2689 Other abnormalities of gait and mobility: Secondary | ICD-10-CM | POA: Diagnosis not present

## 2020-05-13 DIAGNOSIS — G9341 Metabolic encephalopathy: Secondary | ICD-10-CM | POA: Diagnosis not present

## 2020-05-13 DIAGNOSIS — R2681 Unsteadiness on feet: Secondary | ICD-10-CM | POA: Diagnosis not present

## 2020-05-13 DIAGNOSIS — E43 Unspecified severe protein-calorie malnutrition: Secondary | ICD-10-CM | POA: Diagnosis not present

## 2020-05-13 DIAGNOSIS — I10 Essential (primary) hypertension: Secondary | ICD-10-CM | POA: Diagnosis not present

## 2020-05-13 DIAGNOSIS — Z20822 Contact with and (suspected) exposure to covid-19: Secondary | ICD-10-CM | POA: Diagnosis not present

## 2020-05-13 DIAGNOSIS — F411 Generalized anxiety disorder: Secondary | ICD-10-CM | POA: Diagnosis not present

## 2020-05-13 DIAGNOSIS — Z4789 Encounter for other orthopedic aftercare: Secondary | ICD-10-CM | POA: Diagnosis not present

## 2020-05-14 DIAGNOSIS — R262 Difficulty in walking, not elsewhere classified: Secondary | ICD-10-CM | POA: Diagnosis not present

## 2020-05-14 DIAGNOSIS — R2689 Other abnormalities of gait and mobility: Secondary | ICD-10-CM | POA: Diagnosis not present

## 2020-05-14 DIAGNOSIS — S72142S Displaced intertrochanteric fracture of left femur, sequela: Secondary | ICD-10-CM | POA: Diagnosis not present

## 2020-05-14 DIAGNOSIS — L89623 Pressure ulcer of left heel, stage 3: Secondary | ICD-10-CM | POA: Diagnosis not present

## 2020-05-14 DIAGNOSIS — Z4789 Encounter for other orthopedic aftercare: Secondary | ICD-10-CM | POA: Diagnosis not present

## 2020-05-14 DIAGNOSIS — R2681 Unsteadiness on feet: Secondary | ICD-10-CM | POA: Diagnosis not present

## 2020-05-14 DIAGNOSIS — M6281 Muscle weakness (generalized): Secondary | ICD-10-CM | POA: Diagnosis not present

## 2020-05-15 DIAGNOSIS — M6281 Muscle weakness (generalized): Secondary | ICD-10-CM | POA: Diagnosis not present

## 2020-05-15 DIAGNOSIS — Z4789 Encounter for other orthopedic aftercare: Secondary | ICD-10-CM | POA: Diagnosis not present

## 2020-05-15 DIAGNOSIS — R635 Abnormal weight gain: Secondary | ICD-10-CM | POA: Diagnosis not present

## 2020-05-15 DIAGNOSIS — S72142S Displaced intertrochanteric fracture of left femur, sequela: Secondary | ICD-10-CM | POA: Diagnosis not present

## 2020-05-15 DIAGNOSIS — R2689 Other abnormalities of gait and mobility: Secondary | ICD-10-CM | POA: Diagnosis not present

## 2020-05-15 DIAGNOSIS — R2681 Unsteadiness on feet: Secondary | ICD-10-CM | POA: Diagnosis not present

## 2020-05-15 DIAGNOSIS — D649 Anemia, unspecified: Secondary | ICD-10-CM | POA: Diagnosis not present

## 2020-05-15 DIAGNOSIS — R262 Difficulty in walking, not elsewhere classified: Secondary | ICD-10-CM | POA: Diagnosis not present

## 2020-05-16 DIAGNOSIS — E43 Unspecified severe protein-calorie malnutrition: Secondary | ICD-10-CM | POA: Diagnosis not present

## 2020-05-16 DIAGNOSIS — R2689 Other abnormalities of gait and mobility: Secondary | ICD-10-CM | POA: Diagnosis not present

## 2020-05-16 DIAGNOSIS — R2681 Unsteadiness on feet: Secondary | ICD-10-CM | POA: Diagnosis not present

## 2020-05-16 DIAGNOSIS — G9341 Metabolic encephalopathy: Secondary | ICD-10-CM | POA: Diagnosis not present

## 2020-05-16 DIAGNOSIS — Z4789 Encounter for other orthopedic aftercare: Secondary | ICD-10-CM | POA: Diagnosis not present

## 2020-05-16 DIAGNOSIS — M6281 Muscle weakness (generalized): Secondary | ICD-10-CM | POA: Diagnosis not present

## 2020-05-16 DIAGNOSIS — S72142S Displaced intertrochanteric fracture of left femur, sequela: Secondary | ICD-10-CM | POA: Diagnosis not present

## 2020-05-16 DIAGNOSIS — U071 COVID-19: Secondary | ICD-10-CM | POA: Diagnosis not present

## 2020-05-16 DIAGNOSIS — I10 Essential (primary) hypertension: Secondary | ICD-10-CM | POA: Diagnosis not present

## 2020-05-16 DIAGNOSIS — R262 Difficulty in walking, not elsewhere classified: Secondary | ICD-10-CM | POA: Diagnosis not present

## 2020-05-17 DIAGNOSIS — E43 Unspecified severe protein-calorie malnutrition: Secondary | ICD-10-CM | POA: Diagnosis not present

## 2020-05-17 DIAGNOSIS — R262 Difficulty in walking, not elsewhere classified: Secondary | ICD-10-CM | POA: Diagnosis not present

## 2020-05-17 DIAGNOSIS — R2689 Other abnormalities of gait and mobility: Secondary | ICD-10-CM | POA: Diagnosis not present

## 2020-05-17 DIAGNOSIS — E611 Iron deficiency: Secondary | ICD-10-CM | POA: Diagnosis not present

## 2020-05-17 DIAGNOSIS — S72142S Displaced intertrochanteric fracture of left femur, sequela: Secondary | ICD-10-CM | POA: Diagnosis not present

## 2020-05-17 DIAGNOSIS — M6281 Muscle weakness (generalized): Secondary | ICD-10-CM | POA: Diagnosis not present

## 2020-05-17 DIAGNOSIS — D638 Anemia in other chronic diseases classified elsewhere: Secondary | ICD-10-CM | POA: Diagnosis not present

## 2020-05-17 DIAGNOSIS — R2681 Unsteadiness on feet: Secondary | ICD-10-CM | POA: Diagnosis not present

## 2020-05-17 DIAGNOSIS — Z4789 Encounter for other orthopedic aftercare: Secondary | ICD-10-CM | POA: Diagnosis not present

## 2020-05-20 DIAGNOSIS — Z4789 Encounter for other orthopedic aftercare: Secondary | ICD-10-CM | POA: Diagnosis not present

## 2020-05-20 DIAGNOSIS — R262 Difficulty in walking, not elsewhere classified: Secondary | ICD-10-CM | POA: Diagnosis not present

## 2020-05-20 DIAGNOSIS — R2681 Unsteadiness on feet: Secondary | ICD-10-CM | POA: Diagnosis not present

## 2020-05-20 DIAGNOSIS — U071 COVID-19: Secondary | ICD-10-CM | POA: Diagnosis not present

## 2020-05-20 DIAGNOSIS — R2689 Other abnormalities of gait and mobility: Secondary | ICD-10-CM | POA: Diagnosis not present

## 2020-05-20 DIAGNOSIS — M6281 Muscle weakness (generalized): Secondary | ICD-10-CM | POA: Diagnosis not present

## 2020-05-20 DIAGNOSIS — S72142S Displaced intertrochanteric fracture of left femur, sequela: Secondary | ICD-10-CM | POA: Diagnosis not present

## 2020-05-21 DIAGNOSIS — Z4789 Encounter for other orthopedic aftercare: Secondary | ICD-10-CM | POA: Diagnosis not present

## 2020-05-21 DIAGNOSIS — R2681 Unsteadiness on feet: Secondary | ICD-10-CM | POA: Diagnosis not present

## 2020-05-21 DIAGNOSIS — R2689 Other abnormalities of gait and mobility: Secondary | ICD-10-CM | POA: Diagnosis not present

## 2020-05-21 DIAGNOSIS — S72142S Displaced intertrochanteric fracture of left femur, sequela: Secondary | ICD-10-CM | POA: Diagnosis not present

## 2020-05-21 DIAGNOSIS — M6281 Muscle weakness (generalized): Secondary | ICD-10-CM | POA: Diagnosis not present

## 2020-05-21 DIAGNOSIS — R262 Difficulty in walking, not elsewhere classified: Secondary | ICD-10-CM | POA: Diagnosis not present

## 2020-05-22 DIAGNOSIS — S72142S Displaced intertrochanteric fracture of left femur, sequela: Secondary | ICD-10-CM | POA: Diagnosis not present

## 2020-05-22 DIAGNOSIS — Z4789 Encounter for other orthopedic aftercare: Secondary | ICD-10-CM | POA: Diagnosis not present

## 2020-05-22 DIAGNOSIS — R2689 Other abnormalities of gait and mobility: Secondary | ICD-10-CM | POA: Diagnosis not present

## 2020-05-22 DIAGNOSIS — R2681 Unsteadiness on feet: Secondary | ICD-10-CM | POA: Diagnosis not present

## 2020-05-22 DIAGNOSIS — R262 Difficulty in walking, not elsewhere classified: Secondary | ICD-10-CM | POA: Diagnosis not present

## 2020-05-22 DIAGNOSIS — M6281 Muscle weakness (generalized): Secondary | ICD-10-CM | POA: Diagnosis not present

## 2020-05-23 DIAGNOSIS — S72142S Displaced intertrochanteric fracture of left femur, sequela: Secondary | ICD-10-CM | POA: Diagnosis not present

## 2020-05-23 DIAGNOSIS — Z4789 Encounter for other orthopedic aftercare: Secondary | ICD-10-CM | POA: Diagnosis not present

## 2020-05-23 DIAGNOSIS — R2681 Unsteadiness on feet: Secondary | ICD-10-CM | POA: Diagnosis not present

## 2020-05-23 DIAGNOSIS — U071 COVID-19: Secondary | ICD-10-CM | POA: Diagnosis not present

## 2020-05-23 DIAGNOSIS — R262 Difficulty in walking, not elsewhere classified: Secondary | ICD-10-CM | POA: Diagnosis not present

## 2020-05-23 DIAGNOSIS — R2689 Other abnormalities of gait and mobility: Secondary | ICD-10-CM | POA: Diagnosis not present

## 2020-05-23 DIAGNOSIS — M6281 Muscle weakness (generalized): Secondary | ICD-10-CM | POA: Diagnosis not present

## 2020-05-24 DIAGNOSIS — R262 Difficulty in walking, not elsewhere classified: Secondary | ICD-10-CM | POA: Diagnosis not present

## 2020-05-24 DIAGNOSIS — S72142S Displaced intertrochanteric fracture of left femur, sequela: Secondary | ICD-10-CM | POA: Diagnosis not present

## 2020-05-24 DIAGNOSIS — R41841 Cognitive communication deficit: Secondary | ICD-10-CM | POA: Diagnosis not present

## 2020-05-24 DIAGNOSIS — M6281 Muscle weakness (generalized): Secondary | ICD-10-CM | POA: Diagnosis not present

## 2020-05-24 DIAGNOSIS — Z9181 History of falling: Secondary | ICD-10-CM | POA: Diagnosis not present

## 2020-05-24 DIAGNOSIS — Z4789 Encounter for other orthopedic aftercare: Secondary | ICD-10-CM | POA: Diagnosis not present

## 2020-05-24 DIAGNOSIS — R2689 Other abnormalities of gait and mobility: Secondary | ICD-10-CM | POA: Diagnosis not present

## 2020-05-24 DIAGNOSIS — R2681 Unsteadiness on feet: Secondary | ICD-10-CM | POA: Diagnosis not present

## 2020-05-27 DIAGNOSIS — Z4789 Encounter for other orthopedic aftercare: Secondary | ICD-10-CM | POA: Diagnosis not present

## 2020-05-27 DIAGNOSIS — R2681 Unsteadiness on feet: Secondary | ICD-10-CM | POA: Diagnosis not present

## 2020-05-27 DIAGNOSIS — S72142S Displaced intertrochanteric fracture of left femur, sequela: Secondary | ICD-10-CM | POA: Diagnosis not present

## 2020-05-27 DIAGNOSIS — R2689 Other abnormalities of gait and mobility: Secondary | ICD-10-CM | POA: Diagnosis not present

## 2020-05-27 DIAGNOSIS — R262 Difficulty in walking, not elsewhere classified: Secondary | ICD-10-CM | POA: Diagnosis not present

## 2020-05-27 DIAGNOSIS — M6281 Muscle weakness (generalized): Secondary | ICD-10-CM | POA: Diagnosis not present

## 2020-05-28 DIAGNOSIS — Z4789 Encounter for other orthopedic aftercare: Secondary | ICD-10-CM | POA: Diagnosis not present

## 2020-05-28 DIAGNOSIS — U071 COVID-19: Secondary | ICD-10-CM | POA: Diagnosis not present

## 2020-05-28 DIAGNOSIS — S72142S Displaced intertrochanteric fracture of left femur, sequela: Secondary | ICD-10-CM | POA: Diagnosis not present

## 2020-05-28 DIAGNOSIS — M6281 Muscle weakness (generalized): Secondary | ICD-10-CM | POA: Diagnosis not present

## 2020-05-28 DIAGNOSIS — R262 Difficulty in walking, not elsewhere classified: Secondary | ICD-10-CM | POA: Diagnosis not present

## 2020-05-28 DIAGNOSIS — R2689 Other abnormalities of gait and mobility: Secondary | ICD-10-CM | POA: Diagnosis not present

## 2020-05-28 DIAGNOSIS — R2681 Unsteadiness on feet: Secondary | ICD-10-CM | POA: Diagnosis not present

## 2020-05-30 DIAGNOSIS — Z4789 Encounter for other orthopedic aftercare: Secondary | ICD-10-CM | POA: Diagnosis not present

## 2020-05-30 DIAGNOSIS — S72142S Displaced intertrochanteric fracture of left femur, sequela: Secondary | ICD-10-CM | POA: Diagnosis not present

## 2020-05-30 DIAGNOSIS — R262 Difficulty in walking, not elsewhere classified: Secondary | ICD-10-CM | POA: Diagnosis not present

## 2020-05-30 DIAGNOSIS — U071 COVID-19: Secondary | ICD-10-CM | POA: Diagnosis not present

## 2020-05-30 DIAGNOSIS — R2681 Unsteadiness on feet: Secondary | ICD-10-CM | POA: Diagnosis not present

## 2020-05-30 DIAGNOSIS — M6281 Muscle weakness (generalized): Secondary | ICD-10-CM | POA: Diagnosis not present

## 2020-05-30 DIAGNOSIS — R2689 Other abnormalities of gait and mobility: Secondary | ICD-10-CM | POA: Diagnosis not present

## 2020-05-31 DIAGNOSIS — Z4789 Encounter for other orthopedic aftercare: Secondary | ICD-10-CM | POA: Diagnosis not present

## 2020-05-31 DIAGNOSIS — S72142S Displaced intertrochanteric fracture of left femur, sequela: Secondary | ICD-10-CM | POA: Diagnosis not present

## 2020-05-31 DIAGNOSIS — M6281 Muscle weakness (generalized): Secondary | ICD-10-CM | POA: Diagnosis not present

## 2020-05-31 DIAGNOSIS — R2681 Unsteadiness on feet: Secondary | ICD-10-CM | POA: Diagnosis not present

## 2020-05-31 DIAGNOSIS — R262 Difficulty in walking, not elsewhere classified: Secondary | ICD-10-CM | POA: Diagnosis not present

## 2020-05-31 DIAGNOSIS — R2689 Other abnormalities of gait and mobility: Secondary | ICD-10-CM | POA: Diagnosis not present

## 2020-06-01 DIAGNOSIS — S72142S Displaced intertrochanteric fracture of left femur, sequela: Secondary | ICD-10-CM | POA: Diagnosis not present

## 2020-06-01 DIAGNOSIS — M6281 Muscle weakness (generalized): Secondary | ICD-10-CM | POA: Diagnosis not present

## 2020-06-01 DIAGNOSIS — Z4789 Encounter for other orthopedic aftercare: Secondary | ICD-10-CM | POA: Diagnosis not present

## 2020-06-01 DIAGNOSIS — R262 Difficulty in walking, not elsewhere classified: Secondary | ICD-10-CM | POA: Diagnosis not present

## 2020-06-01 DIAGNOSIS — R2689 Other abnormalities of gait and mobility: Secondary | ICD-10-CM | POA: Diagnosis not present

## 2020-06-01 DIAGNOSIS — R2681 Unsteadiness on feet: Secondary | ICD-10-CM | POA: Diagnosis not present

## 2020-06-03 DIAGNOSIS — M6281 Muscle weakness (generalized): Secondary | ICD-10-CM | POA: Diagnosis not present

## 2020-06-03 DIAGNOSIS — R262 Difficulty in walking, not elsewhere classified: Secondary | ICD-10-CM | POA: Diagnosis not present

## 2020-06-03 DIAGNOSIS — R2681 Unsteadiness on feet: Secondary | ICD-10-CM | POA: Diagnosis not present

## 2020-06-03 DIAGNOSIS — S72142S Displaced intertrochanteric fracture of left femur, sequela: Secondary | ICD-10-CM | POA: Diagnosis not present

## 2020-06-03 DIAGNOSIS — Z4789 Encounter for other orthopedic aftercare: Secondary | ICD-10-CM | POA: Diagnosis not present

## 2020-06-03 DIAGNOSIS — R2689 Other abnormalities of gait and mobility: Secondary | ICD-10-CM | POA: Diagnosis not present

## 2020-06-04 DIAGNOSIS — M6281 Muscle weakness (generalized): Secondary | ICD-10-CM | POA: Diagnosis not present

## 2020-06-04 DIAGNOSIS — R262 Difficulty in walking, not elsewhere classified: Secondary | ICD-10-CM | POA: Diagnosis not present

## 2020-06-04 DIAGNOSIS — S72142S Displaced intertrochanteric fracture of left femur, sequela: Secondary | ICD-10-CM | POA: Diagnosis not present

## 2020-06-04 DIAGNOSIS — Z4789 Encounter for other orthopedic aftercare: Secondary | ICD-10-CM | POA: Diagnosis not present

## 2020-06-04 DIAGNOSIS — R2681 Unsteadiness on feet: Secondary | ICD-10-CM | POA: Diagnosis not present

## 2020-06-04 DIAGNOSIS — R2689 Other abnormalities of gait and mobility: Secondary | ICD-10-CM | POA: Diagnosis not present

## 2020-06-05 DIAGNOSIS — M6281 Muscle weakness (generalized): Secondary | ICD-10-CM | POA: Diagnosis not present

## 2020-06-05 DIAGNOSIS — S72142S Displaced intertrochanteric fracture of left femur, sequela: Secondary | ICD-10-CM | POA: Diagnosis not present

## 2020-06-05 DIAGNOSIS — R2681 Unsteadiness on feet: Secondary | ICD-10-CM | POA: Diagnosis not present

## 2020-06-05 DIAGNOSIS — R2689 Other abnormalities of gait and mobility: Secondary | ICD-10-CM | POA: Diagnosis not present

## 2020-06-05 DIAGNOSIS — Z4789 Encounter for other orthopedic aftercare: Secondary | ICD-10-CM | POA: Diagnosis not present

## 2020-06-05 DIAGNOSIS — R262 Difficulty in walking, not elsewhere classified: Secondary | ICD-10-CM | POA: Diagnosis not present

## 2020-06-06 DIAGNOSIS — S72142S Displaced intertrochanteric fracture of left femur, sequela: Secondary | ICD-10-CM | POA: Diagnosis not present

## 2020-06-06 DIAGNOSIS — U071 COVID-19: Secondary | ICD-10-CM | POA: Diagnosis not present

## 2020-06-06 DIAGNOSIS — R262 Difficulty in walking, not elsewhere classified: Secondary | ICD-10-CM | POA: Diagnosis not present

## 2020-06-06 DIAGNOSIS — Z4789 Encounter for other orthopedic aftercare: Secondary | ICD-10-CM | POA: Diagnosis not present

## 2020-06-06 DIAGNOSIS — M6281 Muscle weakness (generalized): Secondary | ICD-10-CM | POA: Diagnosis not present

## 2020-06-06 DIAGNOSIS — R2689 Other abnormalities of gait and mobility: Secondary | ICD-10-CM | POA: Diagnosis not present

## 2020-06-06 DIAGNOSIS — R2681 Unsteadiness on feet: Secondary | ICD-10-CM | POA: Diagnosis not present

## 2020-06-07 DIAGNOSIS — R2681 Unsteadiness on feet: Secondary | ICD-10-CM | POA: Diagnosis not present

## 2020-06-07 DIAGNOSIS — R262 Difficulty in walking, not elsewhere classified: Secondary | ICD-10-CM | POA: Diagnosis not present

## 2020-06-07 DIAGNOSIS — S72142S Displaced intertrochanteric fracture of left femur, sequela: Secondary | ICD-10-CM | POA: Diagnosis not present

## 2020-06-07 DIAGNOSIS — R2689 Other abnormalities of gait and mobility: Secondary | ICD-10-CM | POA: Diagnosis not present

## 2020-06-07 DIAGNOSIS — M6281 Muscle weakness (generalized): Secondary | ICD-10-CM | POA: Diagnosis not present

## 2020-06-07 DIAGNOSIS — Z4789 Encounter for other orthopedic aftercare: Secondary | ICD-10-CM | POA: Diagnosis not present

## 2020-06-10 DIAGNOSIS — R2681 Unsteadiness on feet: Secondary | ICD-10-CM | POA: Diagnosis not present

## 2020-06-10 DIAGNOSIS — R262 Difficulty in walking, not elsewhere classified: Secondary | ICD-10-CM | POA: Diagnosis not present

## 2020-06-10 DIAGNOSIS — M6281 Muscle weakness (generalized): Secondary | ICD-10-CM | POA: Diagnosis not present

## 2020-06-10 DIAGNOSIS — U071 COVID-19: Secondary | ICD-10-CM | POA: Diagnosis not present

## 2020-06-10 DIAGNOSIS — Z4789 Encounter for other orthopedic aftercare: Secondary | ICD-10-CM | POA: Diagnosis not present

## 2020-06-10 DIAGNOSIS — S72142S Displaced intertrochanteric fracture of left femur, sequela: Secondary | ICD-10-CM | POA: Diagnosis not present

## 2020-06-10 DIAGNOSIS — R2689 Other abnormalities of gait and mobility: Secondary | ICD-10-CM | POA: Diagnosis not present

## 2020-06-11 DIAGNOSIS — R262 Difficulty in walking, not elsewhere classified: Secondary | ICD-10-CM | POA: Diagnosis not present

## 2020-06-11 DIAGNOSIS — S72142S Displaced intertrochanteric fracture of left femur, sequela: Secondary | ICD-10-CM | POA: Diagnosis not present

## 2020-06-11 DIAGNOSIS — R2681 Unsteadiness on feet: Secondary | ICD-10-CM | POA: Diagnosis not present

## 2020-06-11 DIAGNOSIS — M6281 Muscle weakness (generalized): Secondary | ICD-10-CM | POA: Diagnosis not present

## 2020-06-11 DIAGNOSIS — Z4789 Encounter for other orthopedic aftercare: Secondary | ICD-10-CM | POA: Diagnosis not present

## 2020-06-11 DIAGNOSIS — R2689 Other abnormalities of gait and mobility: Secondary | ICD-10-CM | POA: Diagnosis not present

## 2020-06-14 DIAGNOSIS — I10 Essential (primary) hypertension: Secondary | ICD-10-CM | POA: Diagnosis not present

## 2020-06-18 DIAGNOSIS — F331 Major depressive disorder, recurrent, moderate: Secondary | ICD-10-CM | POA: Diagnosis not present

## 2020-06-18 DIAGNOSIS — F419 Anxiety disorder, unspecified: Secondary | ICD-10-CM | POA: Diagnosis not present

## 2020-06-25 DIAGNOSIS — D509 Iron deficiency anemia, unspecified: Secondary | ICD-10-CM | POA: Diagnosis not present

## 2020-06-25 DIAGNOSIS — F329 Major depressive disorder, single episode, unspecified: Secondary | ICD-10-CM | POA: Diagnosis not present

## 2020-06-25 DIAGNOSIS — K219 Gastro-esophageal reflux disease without esophagitis: Secondary | ICD-10-CM | POA: Diagnosis not present

## 2020-07-02 DIAGNOSIS — F331 Major depressive disorder, recurrent, moderate: Secondary | ICD-10-CM | POA: Diagnosis not present

## 2020-07-02 DIAGNOSIS — F419 Anxiety disorder, unspecified: Secondary | ICD-10-CM | POA: Diagnosis not present

## 2020-07-02 DIAGNOSIS — Z23 Encounter for immunization: Secondary | ICD-10-CM | POA: Diagnosis not present

## 2020-07-14 DIAGNOSIS — D649 Anemia, unspecified: Secondary | ICD-10-CM | POA: Diagnosis not present

## 2020-07-23 DIAGNOSIS — F331 Major depressive disorder, recurrent, moderate: Secondary | ICD-10-CM | POA: Diagnosis not present

## 2020-07-23 DIAGNOSIS — F419 Anxiety disorder, unspecified: Secondary | ICD-10-CM | POA: Diagnosis not present

## 2020-07-28 DIAGNOSIS — Z8781 Personal history of (healed) traumatic fracture: Secondary | ICD-10-CM | POA: Diagnosis not present

## 2020-07-28 DIAGNOSIS — D649 Anemia, unspecified: Secondary | ICD-10-CM | POA: Diagnosis not present

## 2020-07-28 DIAGNOSIS — M810 Age-related osteoporosis without current pathological fracture: Secondary | ICD-10-CM | POA: Diagnosis not present

## 2020-08-06 DIAGNOSIS — L89612 Pressure ulcer of right heel, stage 2: Secondary | ICD-10-CM | POA: Diagnosis not present

## 2020-08-06 DIAGNOSIS — F329 Major depressive disorder, single episode, unspecified: Secondary | ICD-10-CM | POA: Diagnosis not present

## 2020-08-06 DIAGNOSIS — L89622 Pressure ulcer of left heel, stage 2: Secondary | ICD-10-CM | POA: Diagnosis not present

## 2020-08-06 DIAGNOSIS — L89512 Pressure ulcer of right ankle, stage 2: Secondary | ICD-10-CM | POA: Diagnosis not present

## 2020-08-06 DIAGNOSIS — K219 Gastro-esophageal reflux disease without esophagitis: Secondary | ICD-10-CM | POA: Diagnosis not present

## 2020-08-06 DIAGNOSIS — L98411 Non-pressure chronic ulcer of buttock limited to breakdown of skin: Secondary | ICD-10-CM | POA: Diagnosis not present

## 2020-08-13 DIAGNOSIS — D649 Anemia, unspecified: Secondary | ICD-10-CM | POA: Diagnosis not present

## 2020-08-20 DIAGNOSIS — F419 Anxiety disorder, unspecified: Secondary | ICD-10-CM | POA: Diagnosis not present

## 2020-08-20 DIAGNOSIS — F331 Major depressive disorder, recurrent, moderate: Secondary | ICD-10-CM | POA: Diagnosis not present

## 2020-09-10 DIAGNOSIS — I10 Essential (primary) hypertension: Secondary | ICD-10-CM | POA: Diagnosis not present

## 2020-09-10 DIAGNOSIS — L98411 Non-pressure chronic ulcer of buttock limited to breakdown of skin: Secondary | ICD-10-CM | POA: Diagnosis not present

## 2020-09-10 DIAGNOSIS — D509 Iron deficiency anemia, unspecified: Secondary | ICD-10-CM | POA: Diagnosis not present

## 2020-09-13 DIAGNOSIS — L89513 Pressure ulcer of right ankle, stage 3: Secondary | ICD-10-CM | POA: Diagnosis not present

## 2020-09-13 DIAGNOSIS — L89610 Pressure ulcer of right heel, unstageable: Secondary | ICD-10-CM | POA: Diagnosis not present

## 2020-09-13 DIAGNOSIS — L89620 Pressure ulcer of left heel, unstageable: Secondary | ICD-10-CM | POA: Diagnosis not present

## 2020-09-23 DIAGNOSIS — F331 Major depressive disorder, recurrent, moderate: Secondary | ICD-10-CM | POA: Diagnosis not present

## 2020-09-23 DIAGNOSIS — F419 Anxiety disorder, unspecified: Secondary | ICD-10-CM | POA: Diagnosis not present

## 2020-09-25 DIAGNOSIS — Z20828 Contact with and (suspected) exposure to other viral communicable diseases: Secondary | ICD-10-CM | POA: Diagnosis not present

## 2020-10-08 DIAGNOSIS — D509 Iron deficiency anemia, unspecified: Secondary | ICD-10-CM | POA: Diagnosis not present

## 2020-10-08 DIAGNOSIS — I10 Essential (primary) hypertension: Secondary | ICD-10-CM | POA: Diagnosis not present

## 2020-10-15 DIAGNOSIS — L98411 Non-pressure chronic ulcer of buttock limited to breakdown of skin: Secondary | ICD-10-CM | POA: Diagnosis not present

## 2020-10-15 DIAGNOSIS — F329 Major depressive disorder, single episode, unspecified: Secondary | ICD-10-CM | POA: Diagnosis not present

## 2020-10-15 DIAGNOSIS — I10 Essential (primary) hypertension: Secondary | ICD-10-CM | POA: Diagnosis not present

## 2020-10-21 DIAGNOSIS — F419 Anxiety disorder, unspecified: Secondary | ICD-10-CM | POA: Diagnosis not present

## 2020-10-21 DIAGNOSIS — F331 Major depressive disorder, recurrent, moderate: Secondary | ICD-10-CM | POA: Diagnosis not present

## 2020-11-06 DIAGNOSIS — L89312 Pressure ulcer of right buttock, stage 2: Secondary | ICD-10-CM | POA: Diagnosis not present

## 2020-11-06 DIAGNOSIS — L89622 Pressure ulcer of left heel, stage 2: Secondary | ICD-10-CM | POA: Diagnosis not present

## 2020-11-06 DIAGNOSIS — L89610 Pressure ulcer of right heel, unstageable: Secondary | ICD-10-CM | POA: Diagnosis not present

## 2020-11-19 DIAGNOSIS — K529 Noninfective gastroenteritis and colitis, unspecified: Secondary | ICD-10-CM | POA: Diagnosis not present

## 2020-11-19 DIAGNOSIS — F329 Major depressive disorder, single episode, unspecified: Secondary | ICD-10-CM | POA: Diagnosis not present

## 2020-11-19 DIAGNOSIS — F331 Major depressive disorder, recurrent, moderate: Secondary | ICD-10-CM | POA: Diagnosis not present

## 2020-11-19 DIAGNOSIS — K219 Gastro-esophageal reflux disease without esophagitis: Secondary | ICD-10-CM | POA: Diagnosis not present

## 2020-11-19 DIAGNOSIS — F419 Anxiety disorder, unspecified: Secondary | ICD-10-CM | POA: Diagnosis not present

## 2020-12-17 DIAGNOSIS — F419 Anxiety disorder, unspecified: Secondary | ICD-10-CM | POA: Diagnosis not present

## 2020-12-17 DIAGNOSIS — F331 Major depressive disorder, recurrent, moderate: Secondary | ICD-10-CM | POA: Diagnosis not present

## 2020-12-17 DIAGNOSIS — Z23 Encounter for immunization: Secondary | ICD-10-CM | POA: Diagnosis not present

## 2020-12-24 DIAGNOSIS — I10 Essential (primary) hypertension: Secondary | ICD-10-CM | POA: Diagnosis not present

## 2020-12-24 DIAGNOSIS — Z0000 Encounter for general adult medical examination without abnormal findings: Secondary | ICD-10-CM | POA: Diagnosis not present

## 2020-12-24 DIAGNOSIS — F329 Major depressive disorder, single episode, unspecified: Secondary | ICD-10-CM | POA: Diagnosis not present

## 2020-12-24 DIAGNOSIS — K219 Gastro-esophageal reflux disease without esophagitis: Secondary | ICD-10-CM | POA: Diagnosis not present

## 2021-01-09 DIAGNOSIS — I10 Essential (primary) hypertension: Secondary | ICD-10-CM | POA: Diagnosis not present

## 2021-01-09 DIAGNOSIS — F329 Major depressive disorder, single episode, unspecified: Secondary | ICD-10-CM | POA: Diagnosis not present

## 2021-01-09 DIAGNOSIS — Z85820 Personal history of malignant melanoma of skin: Secondary | ICD-10-CM | POA: Diagnosis not present

## 2021-01-09 DIAGNOSIS — L98411 Non-pressure chronic ulcer of buttock limited to breakdown of skin: Secondary | ICD-10-CM | POA: Diagnosis not present

## 2021-01-13 DIAGNOSIS — L98411 Non-pressure chronic ulcer of buttock limited to breakdown of skin: Secondary | ICD-10-CM | POA: Diagnosis not present

## 2021-01-13 DIAGNOSIS — F329 Major depressive disorder, single episode, unspecified: Secondary | ICD-10-CM | POA: Diagnosis not present

## 2021-01-13 DIAGNOSIS — Z85820 Personal history of malignant melanoma of skin: Secondary | ICD-10-CM | POA: Diagnosis not present

## 2021-01-13 DIAGNOSIS — I10 Essential (primary) hypertension: Secondary | ICD-10-CM | POA: Diagnosis not present

## 2021-01-16 DIAGNOSIS — F329 Major depressive disorder, single episode, unspecified: Secondary | ICD-10-CM | POA: Diagnosis not present

## 2021-01-16 DIAGNOSIS — L98411 Non-pressure chronic ulcer of buttock limited to breakdown of skin: Secondary | ICD-10-CM | POA: Diagnosis not present

## 2021-01-16 DIAGNOSIS — I10 Essential (primary) hypertension: Secondary | ICD-10-CM | POA: Diagnosis not present

## 2021-01-16 DIAGNOSIS — Z85820 Personal history of malignant melanoma of skin: Secondary | ICD-10-CM | POA: Diagnosis not present

## 2021-01-20 DIAGNOSIS — L98411 Non-pressure chronic ulcer of buttock limited to breakdown of skin: Secondary | ICD-10-CM | POA: Diagnosis not present

## 2021-01-20 DIAGNOSIS — F331 Major depressive disorder, recurrent, moderate: Secondary | ICD-10-CM | POA: Diagnosis not present

## 2021-01-20 DIAGNOSIS — Z85820 Personal history of malignant melanoma of skin: Secondary | ICD-10-CM | POA: Diagnosis not present

## 2021-01-20 DIAGNOSIS — F329 Major depressive disorder, single episode, unspecified: Secondary | ICD-10-CM | POA: Diagnosis not present

## 2021-01-20 DIAGNOSIS — I10 Essential (primary) hypertension: Secondary | ICD-10-CM | POA: Diagnosis not present

## 2021-01-20 DIAGNOSIS — F419 Anxiety disorder, unspecified: Secondary | ICD-10-CM | POA: Diagnosis not present

## 2021-01-23 DIAGNOSIS — F329 Major depressive disorder, single episode, unspecified: Secondary | ICD-10-CM | POA: Diagnosis not present

## 2021-01-23 DIAGNOSIS — Z85820 Personal history of malignant melanoma of skin: Secondary | ICD-10-CM | POA: Diagnosis not present

## 2021-01-23 DIAGNOSIS — I10 Essential (primary) hypertension: Secondary | ICD-10-CM | POA: Diagnosis not present

## 2021-01-23 DIAGNOSIS — L98411 Non-pressure chronic ulcer of buttock limited to breakdown of skin: Secondary | ICD-10-CM | POA: Diagnosis not present

## 2021-01-27 DIAGNOSIS — Z85820 Personal history of malignant melanoma of skin: Secondary | ICD-10-CM | POA: Diagnosis not present

## 2021-01-27 DIAGNOSIS — F329 Major depressive disorder, single episode, unspecified: Secondary | ICD-10-CM | POA: Diagnosis not present

## 2021-01-27 DIAGNOSIS — L98411 Non-pressure chronic ulcer of buttock limited to breakdown of skin: Secondary | ICD-10-CM | POA: Diagnosis not present

## 2021-01-27 DIAGNOSIS — I10 Essential (primary) hypertension: Secondary | ICD-10-CM | POA: Diagnosis not present

## 2021-01-28 DIAGNOSIS — K219 Gastro-esophageal reflux disease without esophagitis: Secondary | ICD-10-CM | POA: Diagnosis not present

## 2021-01-28 DIAGNOSIS — I10 Essential (primary) hypertension: Secondary | ICD-10-CM | POA: Diagnosis not present

## 2021-01-28 DIAGNOSIS — L98411 Non-pressure chronic ulcer of buttock limited to breakdown of skin: Secondary | ICD-10-CM | POA: Diagnosis not present

## 2021-01-28 DIAGNOSIS — D509 Iron deficiency anemia, unspecified: Secondary | ICD-10-CM | POA: Diagnosis not present

## 2021-01-30 DIAGNOSIS — F329 Major depressive disorder, single episode, unspecified: Secondary | ICD-10-CM | POA: Diagnosis not present

## 2021-01-30 DIAGNOSIS — L98411 Non-pressure chronic ulcer of buttock limited to breakdown of skin: Secondary | ICD-10-CM | POA: Diagnosis not present

## 2021-01-30 DIAGNOSIS — Z85820 Personal history of malignant melanoma of skin: Secondary | ICD-10-CM | POA: Diagnosis not present

## 2021-01-30 DIAGNOSIS — I10 Essential (primary) hypertension: Secondary | ICD-10-CM | POA: Diagnosis not present

## 2021-02-03 DIAGNOSIS — L98411 Non-pressure chronic ulcer of buttock limited to breakdown of skin: Secondary | ICD-10-CM | POA: Diagnosis not present

## 2021-02-03 DIAGNOSIS — F329 Major depressive disorder, single episode, unspecified: Secondary | ICD-10-CM | POA: Diagnosis not present

## 2021-02-03 DIAGNOSIS — Z85820 Personal history of malignant melanoma of skin: Secondary | ICD-10-CM | POA: Diagnosis not present

## 2021-02-03 DIAGNOSIS — I10 Essential (primary) hypertension: Secondary | ICD-10-CM | POA: Diagnosis not present

## 2021-02-06 DIAGNOSIS — L98411 Non-pressure chronic ulcer of buttock limited to breakdown of skin: Secondary | ICD-10-CM | POA: Diagnosis not present

## 2021-02-06 DIAGNOSIS — Z85820 Personal history of malignant melanoma of skin: Secondary | ICD-10-CM | POA: Diagnosis not present

## 2021-02-06 DIAGNOSIS — I10 Essential (primary) hypertension: Secondary | ICD-10-CM | POA: Diagnosis not present

## 2021-02-06 DIAGNOSIS — F329 Major depressive disorder, single episode, unspecified: Secondary | ICD-10-CM | POA: Diagnosis not present

## 2021-02-08 DIAGNOSIS — I10 Essential (primary) hypertension: Secondary | ICD-10-CM | POA: Diagnosis not present

## 2021-02-08 DIAGNOSIS — L98411 Non-pressure chronic ulcer of buttock limited to breakdown of skin: Secondary | ICD-10-CM | POA: Diagnosis not present

## 2021-02-08 DIAGNOSIS — F329 Major depressive disorder, single episode, unspecified: Secondary | ICD-10-CM | POA: Diagnosis not present

## 2021-02-08 DIAGNOSIS — Z85820 Personal history of malignant melanoma of skin: Secondary | ICD-10-CM | POA: Diagnosis not present

## 2021-02-10 DIAGNOSIS — F329 Major depressive disorder, single episode, unspecified: Secondary | ICD-10-CM | POA: Diagnosis not present

## 2021-02-10 DIAGNOSIS — L98411 Non-pressure chronic ulcer of buttock limited to breakdown of skin: Secondary | ICD-10-CM | POA: Diagnosis not present

## 2021-02-10 DIAGNOSIS — I10 Essential (primary) hypertension: Secondary | ICD-10-CM | POA: Diagnosis not present

## 2021-02-10 DIAGNOSIS — Z85820 Personal history of malignant melanoma of skin: Secondary | ICD-10-CM | POA: Diagnosis not present

## 2021-02-13 DIAGNOSIS — L98411 Non-pressure chronic ulcer of buttock limited to breakdown of skin: Secondary | ICD-10-CM | POA: Diagnosis not present

## 2021-02-13 DIAGNOSIS — Z85820 Personal history of malignant melanoma of skin: Secondary | ICD-10-CM | POA: Diagnosis not present

## 2021-02-13 DIAGNOSIS — I10 Essential (primary) hypertension: Secondary | ICD-10-CM | POA: Diagnosis not present

## 2021-02-13 DIAGNOSIS — F329 Major depressive disorder, single episode, unspecified: Secondary | ICD-10-CM | POA: Diagnosis not present

## 2021-02-17 DIAGNOSIS — I10 Essential (primary) hypertension: Secondary | ICD-10-CM | POA: Diagnosis not present

## 2021-02-17 DIAGNOSIS — Z85820 Personal history of malignant melanoma of skin: Secondary | ICD-10-CM | POA: Diagnosis not present

## 2021-02-17 DIAGNOSIS — L98411 Non-pressure chronic ulcer of buttock limited to breakdown of skin: Secondary | ICD-10-CM | POA: Diagnosis not present

## 2021-02-17 DIAGNOSIS — F329 Major depressive disorder, single episode, unspecified: Secondary | ICD-10-CM | POA: Diagnosis not present

## 2021-02-20 DIAGNOSIS — I10 Essential (primary) hypertension: Secondary | ICD-10-CM | POA: Diagnosis not present

## 2021-02-20 DIAGNOSIS — Z85820 Personal history of malignant melanoma of skin: Secondary | ICD-10-CM | POA: Diagnosis not present

## 2021-02-20 DIAGNOSIS — F329 Major depressive disorder, single episode, unspecified: Secondary | ICD-10-CM | POA: Diagnosis not present

## 2021-02-20 DIAGNOSIS — F331 Major depressive disorder, recurrent, moderate: Secondary | ICD-10-CM | POA: Diagnosis not present

## 2021-02-20 DIAGNOSIS — L98411 Non-pressure chronic ulcer of buttock limited to breakdown of skin: Secondary | ICD-10-CM | POA: Diagnosis not present

## 2021-02-20 DIAGNOSIS — F419 Anxiety disorder, unspecified: Secondary | ICD-10-CM | POA: Diagnosis not present

## 2021-02-24 DIAGNOSIS — F329 Major depressive disorder, single episode, unspecified: Secondary | ICD-10-CM | POA: Diagnosis not present

## 2021-02-24 DIAGNOSIS — Z85820 Personal history of malignant melanoma of skin: Secondary | ICD-10-CM | POA: Diagnosis not present

## 2021-02-24 DIAGNOSIS — L98411 Non-pressure chronic ulcer of buttock limited to breakdown of skin: Secondary | ICD-10-CM | POA: Diagnosis not present

## 2021-02-24 DIAGNOSIS — I10 Essential (primary) hypertension: Secondary | ICD-10-CM | POA: Diagnosis not present

## 2021-02-27 DIAGNOSIS — F329 Major depressive disorder, single episode, unspecified: Secondary | ICD-10-CM | POA: Diagnosis not present

## 2021-02-27 DIAGNOSIS — Z85820 Personal history of malignant melanoma of skin: Secondary | ICD-10-CM | POA: Diagnosis not present

## 2021-02-27 DIAGNOSIS — L98411 Non-pressure chronic ulcer of buttock limited to breakdown of skin: Secondary | ICD-10-CM | POA: Diagnosis not present

## 2021-02-27 DIAGNOSIS — I10 Essential (primary) hypertension: Secondary | ICD-10-CM | POA: Diagnosis not present

## 2021-03-04 DIAGNOSIS — K219 Gastro-esophageal reflux disease without esophagitis: Secondary | ICD-10-CM | POA: Diagnosis not present

## 2021-03-04 DIAGNOSIS — E559 Vitamin D deficiency, unspecified: Secondary | ICD-10-CM | POA: Diagnosis not present

## 2021-03-04 DIAGNOSIS — M159 Polyosteoarthritis, unspecified: Secondary | ICD-10-CM | POA: Diagnosis not present

## 2021-03-06 DIAGNOSIS — L98411 Non-pressure chronic ulcer of buttock limited to breakdown of skin: Secondary | ICD-10-CM | POA: Diagnosis not present

## 2021-03-06 DIAGNOSIS — I10 Essential (primary) hypertension: Secondary | ICD-10-CM | POA: Diagnosis not present

## 2021-03-06 DIAGNOSIS — Z85820 Personal history of malignant melanoma of skin: Secondary | ICD-10-CM | POA: Diagnosis not present

## 2021-03-06 DIAGNOSIS — F329 Major depressive disorder, single episode, unspecified: Secondary | ICD-10-CM | POA: Diagnosis not present

## 2021-03-11 DIAGNOSIS — G894 Chronic pain syndrome: Secondary | ICD-10-CM | POA: Diagnosis not present

## 2021-03-12 DIAGNOSIS — F419 Anxiety disorder, unspecified: Secondary | ICD-10-CM | POA: Diagnosis not present

## 2021-03-12 DIAGNOSIS — F331 Major depressive disorder, recurrent, moderate: Secondary | ICD-10-CM | POA: Diagnosis not present

## 2021-03-20 DIAGNOSIS — F331 Major depressive disorder, recurrent, moderate: Secondary | ICD-10-CM | POA: Diagnosis not present

## 2021-03-20 DIAGNOSIS — F419 Anxiety disorder, unspecified: Secondary | ICD-10-CM | POA: Diagnosis not present

## 2021-04-01 DIAGNOSIS — I10 Essential (primary) hypertension: Secondary | ICD-10-CM | POA: Diagnosis not present

## 2021-04-01 DIAGNOSIS — D509 Iron deficiency anemia, unspecified: Secondary | ICD-10-CM | POA: Diagnosis not present

## 2021-04-01 DIAGNOSIS — K219 Gastro-esophageal reflux disease without esophagitis: Secondary | ICD-10-CM | POA: Diagnosis not present

## 2021-04-08 DIAGNOSIS — I10 Essential (primary) hypertension: Secondary | ICD-10-CM | POA: Diagnosis not present

## 2021-04-08 DIAGNOSIS — E559 Vitamin D deficiency, unspecified: Secondary | ICD-10-CM | POA: Diagnosis not present

## 2021-04-17 DIAGNOSIS — F419 Anxiety disorder, unspecified: Secondary | ICD-10-CM | POA: Diagnosis not present

## 2021-04-17 DIAGNOSIS — F331 Major depressive disorder, recurrent, moderate: Secondary | ICD-10-CM | POA: Diagnosis not present

## 2021-04-24 DIAGNOSIS — E039 Hypothyroidism, unspecified: Secondary | ICD-10-CM | POA: Diagnosis not present

## 2021-04-24 DIAGNOSIS — I10 Essential (primary) hypertension: Secondary | ICD-10-CM | POA: Diagnosis not present

## 2021-04-24 DIAGNOSIS — F331 Major depressive disorder, recurrent, moderate: Secondary | ICD-10-CM | POA: Diagnosis not present

## 2021-04-24 DIAGNOSIS — D509 Iron deficiency anemia, unspecified: Secondary | ICD-10-CM | POA: Diagnosis not present

## 2021-04-29 DIAGNOSIS — E559 Vitamin D deficiency, unspecified: Secondary | ICD-10-CM | POA: Diagnosis not present

## 2021-04-29 DIAGNOSIS — F331 Major depressive disorder, recurrent, moderate: Secondary | ICD-10-CM | POA: Diagnosis not present

## 2021-04-29 DIAGNOSIS — I10 Essential (primary) hypertension: Secondary | ICD-10-CM | POA: Diagnosis not present

## 2021-05-13 DIAGNOSIS — G8929 Other chronic pain: Secondary | ICD-10-CM | POA: Diagnosis not present

## 2021-05-15 DIAGNOSIS — Z23 Encounter for immunization: Secondary | ICD-10-CM | POA: Diagnosis not present

## 2021-05-22 DIAGNOSIS — F331 Major depressive disorder, recurrent, moderate: Secondary | ICD-10-CM | POA: Diagnosis not present

## 2021-05-22 DIAGNOSIS — F419 Anxiety disorder, unspecified: Secondary | ICD-10-CM | POA: Diagnosis not present

## 2021-05-26 IMAGING — CR DG CHEST 1V
1 series · 1 of 1 positions shown · non-contrast
Comparison: None.

CLINICAL DATA: Hip fracture.  Fall

EXAM:
CHEST  1 VIEW

[x chest ap]
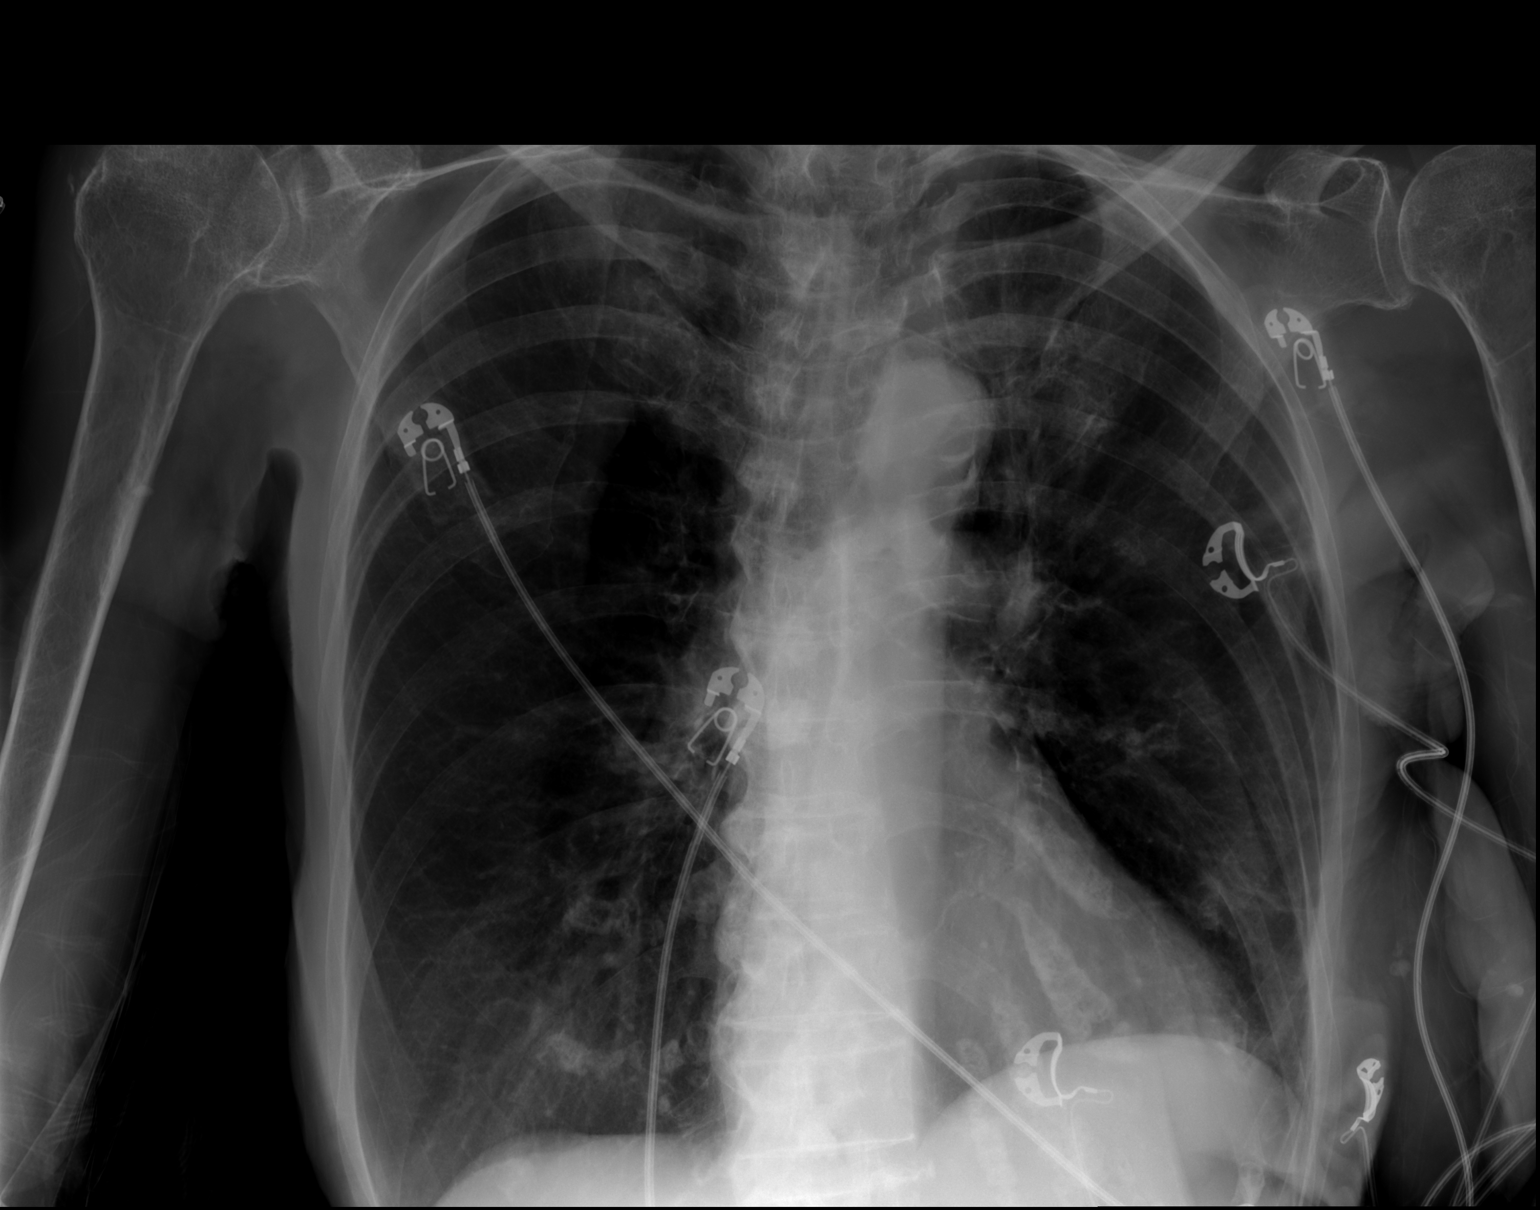

[1 of 1 positions shown; findings below may reference images not displayed]

FINDINGS: Pulmonary hyperinflation. Lungs clear without infiltrate or
effusion. Negative for heart failure.
IMPRESSION: COPD without acute abnormality.

## 2021-05-26 IMAGING — CR DG HIP (WITH OR WITHOUT PELVIS) 2-3V*L*
3 series · 3 of 3 positions shown · non-contrast
Comparison: None.

CLINICAL DATA: Fall

EXAM:
DG HIP (WITH OR WITHOUT PELVIS) 2-3V LEFT

[x pelvis]
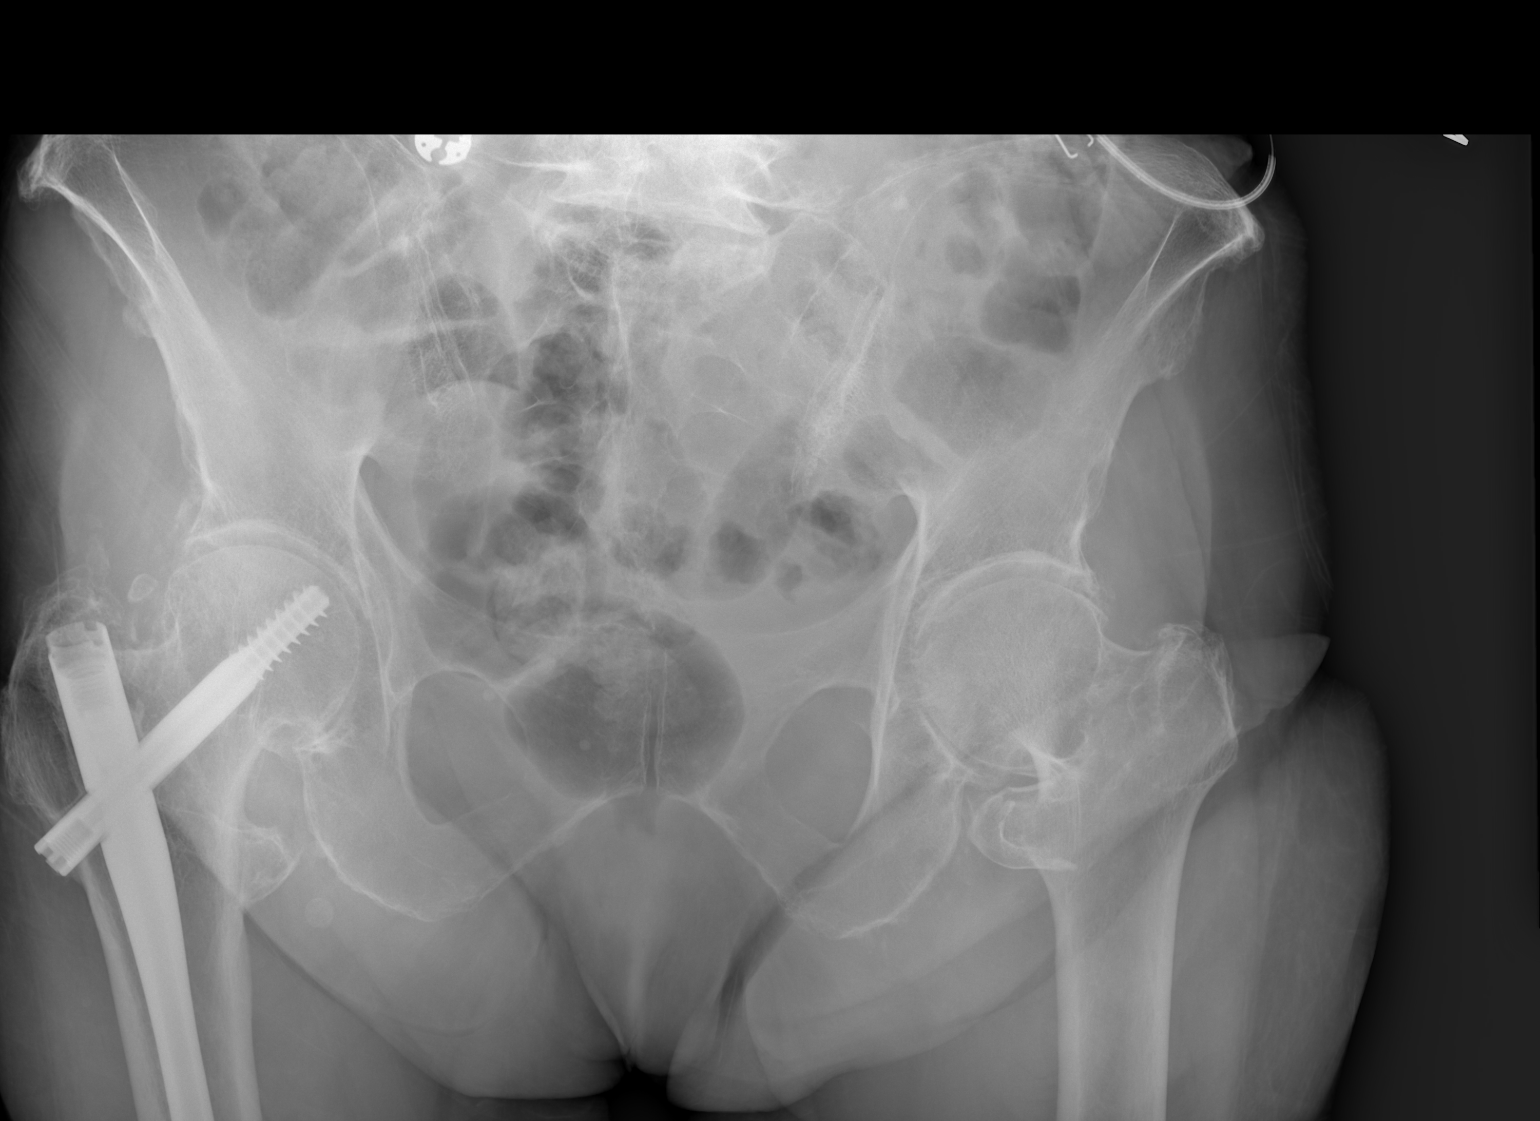

[x hip ap left]
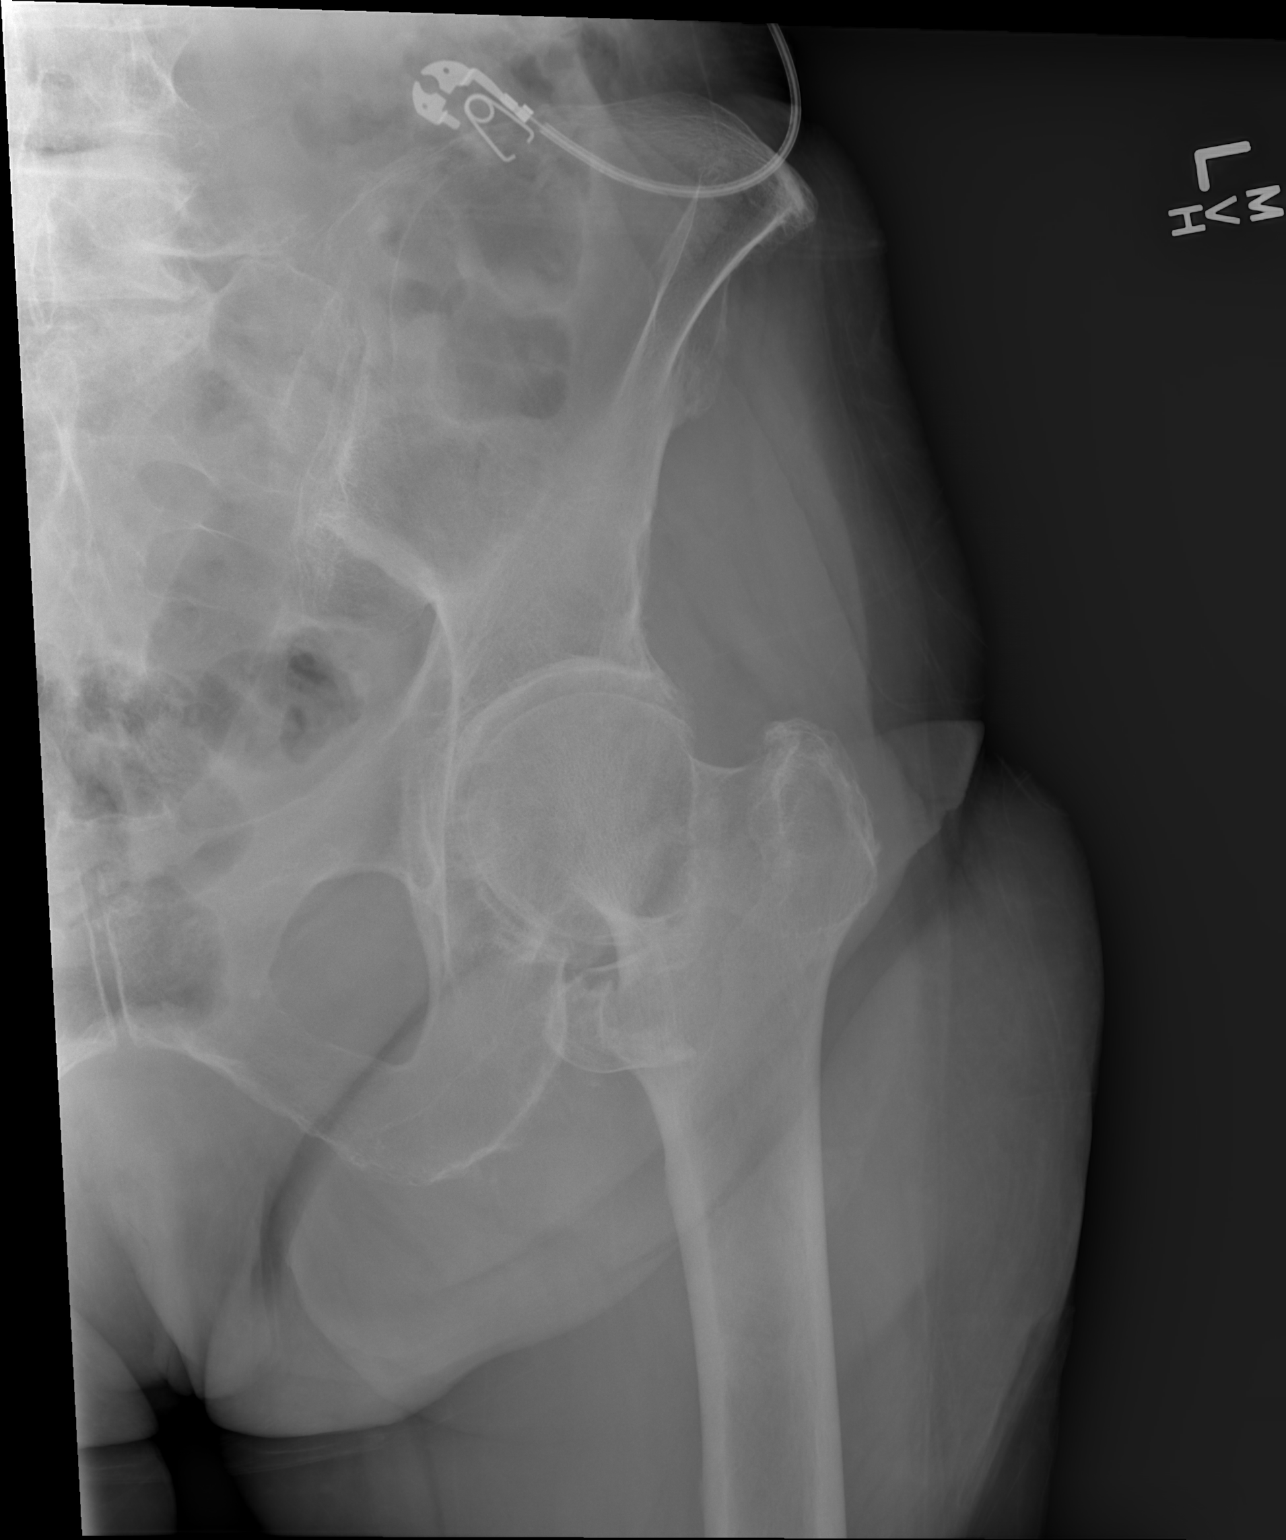

[w hip lat left]
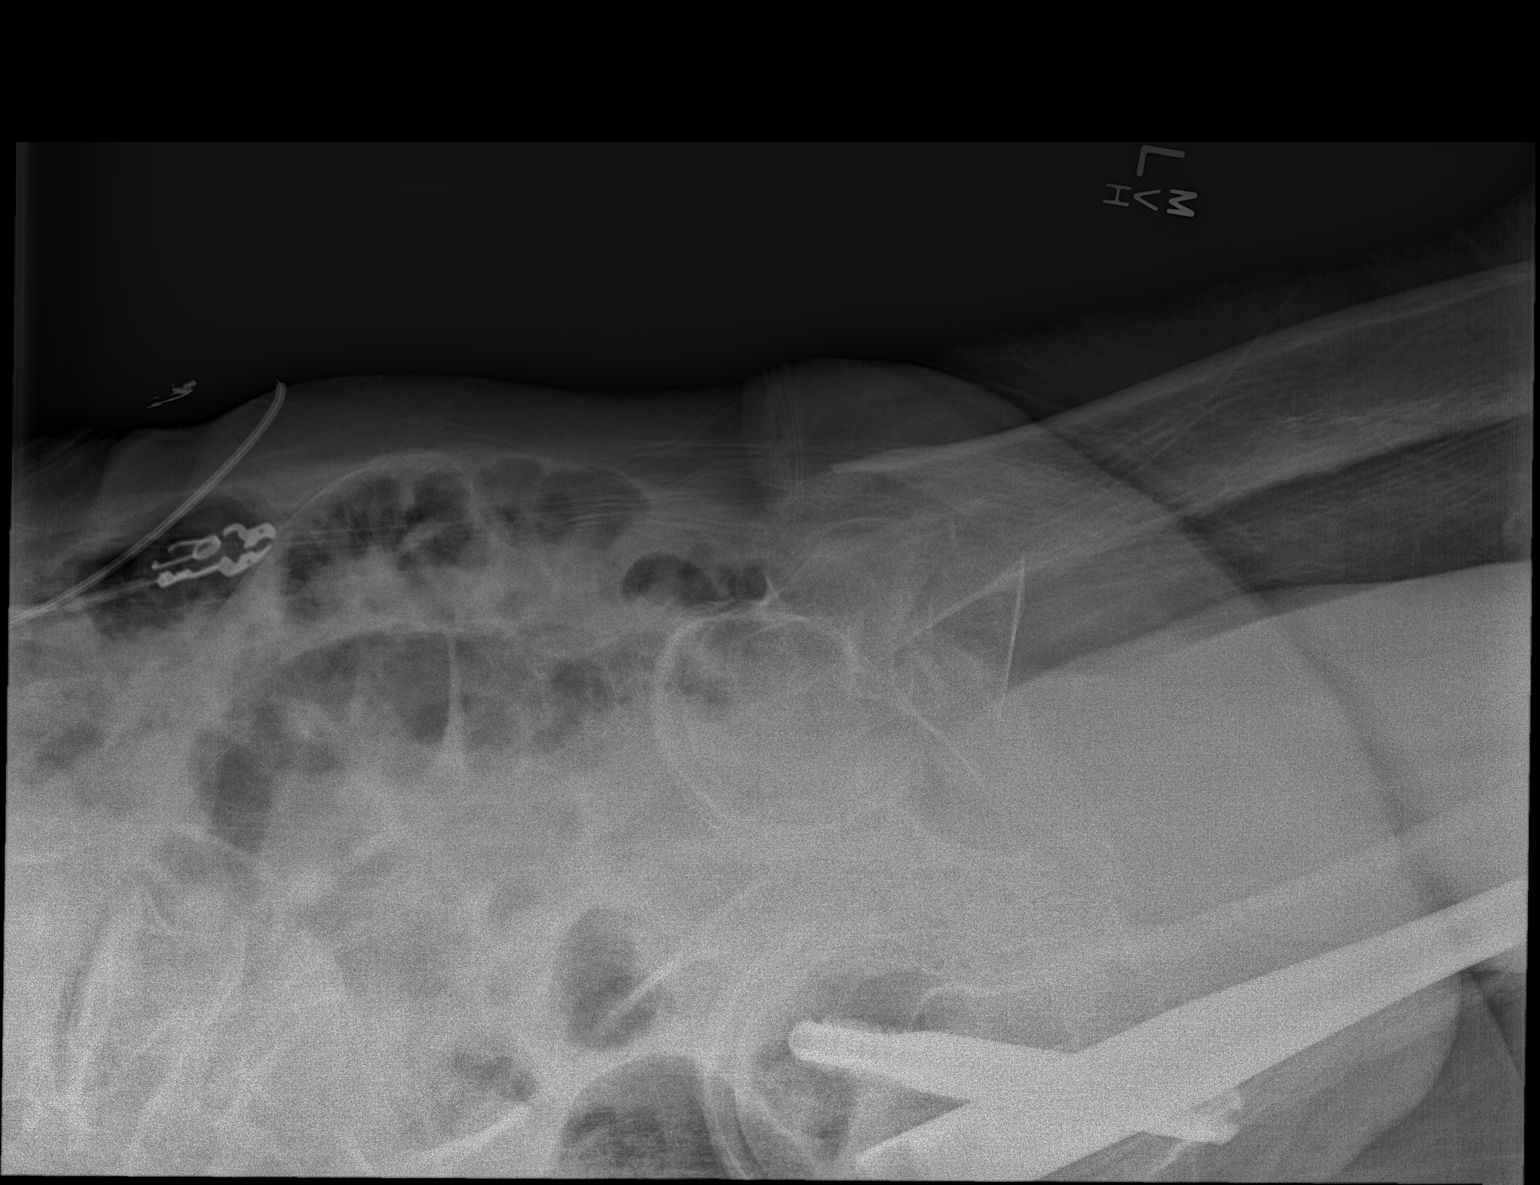

[3 of 3 positions shown; findings below may reference images not displayed]

FINDINGS: Impacted fracture left femoral neck. Left hip joint otherwise normal

Surgical repair of right hip fracture with screw and rod. No other
pelvic lesion
IMPRESSION: Impacted fracture left femoral neck.

## 2021-06-03 DIAGNOSIS — Z23 Encounter for immunization: Secondary | ICD-10-CM | POA: Diagnosis not present

## 2021-06-03 DIAGNOSIS — M25571 Pain in right ankle and joints of right foot: Secondary | ICD-10-CM | POA: Diagnosis not present

## 2021-06-03 DIAGNOSIS — L89512 Pressure ulcer of right ankle, stage 2: Secondary | ICD-10-CM | POA: Diagnosis not present

## 2021-06-06 DIAGNOSIS — Z9181 History of falling: Secondary | ICD-10-CM | POA: Diagnosis not present

## 2021-06-06 DIAGNOSIS — I10 Essential (primary) hypertension: Secondary | ICD-10-CM | POA: Diagnosis not present

## 2021-06-06 DIAGNOSIS — L89510 Pressure ulcer of right ankle, unstageable: Secondary | ICD-10-CM | POA: Diagnosis not present

## 2021-06-06 DIAGNOSIS — E43 Unspecified severe protein-calorie malnutrition: Secondary | ICD-10-CM | POA: Diagnosis not present

## 2021-06-06 DIAGNOSIS — M810 Age-related osteoporosis without current pathological fracture: Secondary | ICD-10-CM | POA: Diagnosis not present

## 2021-06-06 DIAGNOSIS — D649 Anemia, unspecified: Secondary | ICD-10-CM | POA: Diagnosis not present

## 2021-06-06 DIAGNOSIS — F329 Major depressive disorder, single episode, unspecified: Secondary | ICD-10-CM | POA: Diagnosis not present

## 2021-06-10 DIAGNOSIS — I10 Essential (primary) hypertension: Secondary | ICD-10-CM | POA: Diagnosis not present

## 2021-06-10 DIAGNOSIS — E039 Hypothyroidism, unspecified: Secondary | ICD-10-CM | POA: Diagnosis not present

## 2021-06-12 DIAGNOSIS — I10 Essential (primary) hypertension: Secondary | ICD-10-CM | POA: Diagnosis not present

## 2021-06-12 DIAGNOSIS — D649 Anemia, unspecified: Secondary | ICD-10-CM | POA: Diagnosis not present

## 2021-06-12 DIAGNOSIS — F329 Major depressive disorder, single episode, unspecified: Secondary | ICD-10-CM | POA: Diagnosis not present

## 2021-06-12 DIAGNOSIS — E43 Unspecified severe protein-calorie malnutrition: Secondary | ICD-10-CM | POA: Diagnosis not present

## 2021-06-12 DIAGNOSIS — M810 Age-related osteoporosis without current pathological fracture: Secondary | ICD-10-CM | POA: Diagnosis not present

## 2021-06-12 DIAGNOSIS — L89510 Pressure ulcer of right ankle, unstageable: Secondary | ICD-10-CM | POA: Diagnosis not present

## 2021-06-17 DIAGNOSIS — M810 Age-related osteoporosis without current pathological fracture: Secondary | ICD-10-CM | POA: Diagnosis not present

## 2021-06-17 DIAGNOSIS — D649 Anemia, unspecified: Secondary | ICD-10-CM | POA: Diagnosis not present

## 2021-06-17 DIAGNOSIS — E43 Unspecified severe protein-calorie malnutrition: Secondary | ICD-10-CM | POA: Diagnosis not present

## 2021-06-17 DIAGNOSIS — L89510 Pressure ulcer of right ankle, unstageable: Secondary | ICD-10-CM | POA: Diagnosis not present

## 2021-06-17 DIAGNOSIS — F419 Anxiety disorder, unspecified: Secondary | ICD-10-CM | POA: Diagnosis not present

## 2021-06-17 DIAGNOSIS — F331 Major depressive disorder, recurrent, moderate: Secondary | ICD-10-CM | POA: Diagnosis not present

## 2021-06-17 DIAGNOSIS — F329 Major depressive disorder, single episode, unspecified: Secondary | ICD-10-CM | POA: Diagnosis not present

## 2021-06-17 DIAGNOSIS — I10 Essential (primary) hypertension: Secondary | ICD-10-CM | POA: Diagnosis not present

## 2021-06-17 DIAGNOSIS — G8929 Other chronic pain: Secondary | ICD-10-CM | POA: Diagnosis not present

## 2021-06-20 DIAGNOSIS — E43 Unspecified severe protein-calorie malnutrition: Secondary | ICD-10-CM | POA: Diagnosis not present

## 2021-06-20 DIAGNOSIS — M810 Age-related osteoporosis without current pathological fracture: Secondary | ICD-10-CM | POA: Diagnosis not present

## 2021-06-20 DIAGNOSIS — D649 Anemia, unspecified: Secondary | ICD-10-CM | POA: Diagnosis not present

## 2021-06-20 DIAGNOSIS — F329 Major depressive disorder, single episode, unspecified: Secondary | ICD-10-CM | POA: Diagnosis not present

## 2021-06-20 DIAGNOSIS — I10 Essential (primary) hypertension: Secondary | ICD-10-CM | POA: Diagnosis not present

## 2021-06-20 DIAGNOSIS — L89510 Pressure ulcer of right ankle, unstageable: Secondary | ICD-10-CM | POA: Diagnosis not present

## 2021-06-22 DIAGNOSIS — E43 Unspecified severe protein-calorie malnutrition: Secondary | ICD-10-CM | POA: Diagnosis not present

## 2021-06-22 DIAGNOSIS — F329 Major depressive disorder, single episode, unspecified: Secondary | ICD-10-CM | POA: Diagnosis not present

## 2021-06-22 DIAGNOSIS — L89510 Pressure ulcer of right ankle, unstageable: Secondary | ICD-10-CM | POA: Diagnosis not present

## 2021-06-22 DIAGNOSIS — M810 Age-related osteoporosis without current pathological fracture: Secondary | ICD-10-CM | POA: Diagnosis not present

## 2021-06-22 DIAGNOSIS — I10 Essential (primary) hypertension: Secondary | ICD-10-CM | POA: Diagnosis not present

## 2021-06-22 DIAGNOSIS — D649 Anemia, unspecified: Secondary | ICD-10-CM | POA: Diagnosis not present

## 2021-06-24 DIAGNOSIS — K219 Gastro-esophageal reflux disease without esophagitis: Secondary | ICD-10-CM | POA: Diagnosis not present

## 2021-06-24 DIAGNOSIS — L89512 Pressure ulcer of right ankle, stage 2: Secondary | ICD-10-CM | POA: Diagnosis not present

## 2021-06-24 DIAGNOSIS — F3341 Major depressive disorder, recurrent, in partial remission: Secondary | ICD-10-CM | POA: Diagnosis not present

## 2021-06-24 DIAGNOSIS — L89622 Pressure ulcer of left heel, stage 2: Secondary | ICD-10-CM | POA: Diagnosis not present

## 2021-06-24 DIAGNOSIS — E039 Hypothyroidism, unspecified: Secondary | ICD-10-CM | POA: Diagnosis not present

## 2021-06-25 DIAGNOSIS — E43 Unspecified severe protein-calorie malnutrition: Secondary | ICD-10-CM | POA: Diagnosis not present

## 2021-06-25 DIAGNOSIS — L89510 Pressure ulcer of right ankle, unstageable: Secondary | ICD-10-CM | POA: Diagnosis not present

## 2021-06-25 DIAGNOSIS — D649 Anemia, unspecified: Secondary | ICD-10-CM | POA: Diagnosis not present

## 2021-06-25 DIAGNOSIS — F329 Major depressive disorder, single episode, unspecified: Secondary | ICD-10-CM | POA: Diagnosis not present

## 2021-06-25 DIAGNOSIS — I10 Essential (primary) hypertension: Secondary | ICD-10-CM | POA: Diagnosis not present

## 2021-06-25 DIAGNOSIS — M810 Age-related osteoporosis without current pathological fracture: Secondary | ICD-10-CM | POA: Diagnosis not present

## 2021-06-30 DIAGNOSIS — F329 Major depressive disorder, single episode, unspecified: Secondary | ICD-10-CM | POA: Diagnosis not present

## 2021-06-30 DIAGNOSIS — I10 Essential (primary) hypertension: Secondary | ICD-10-CM | POA: Diagnosis not present

## 2021-06-30 DIAGNOSIS — L89510 Pressure ulcer of right ankle, unstageable: Secondary | ICD-10-CM | POA: Diagnosis not present

## 2021-06-30 DIAGNOSIS — D649 Anemia, unspecified: Secondary | ICD-10-CM | POA: Diagnosis not present

## 2021-06-30 DIAGNOSIS — M810 Age-related osteoporosis without current pathological fracture: Secondary | ICD-10-CM | POA: Diagnosis not present

## 2021-06-30 DIAGNOSIS — E43 Unspecified severe protein-calorie malnutrition: Secondary | ICD-10-CM | POA: Diagnosis not present

## 2021-07-04 DIAGNOSIS — D649 Anemia, unspecified: Secondary | ICD-10-CM | POA: Diagnosis not present

## 2021-07-04 DIAGNOSIS — F329 Major depressive disorder, single episode, unspecified: Secondary | ICD-10-CM | POA: Diagnosis not present

## 2021-07-04 DIAGNOSIS — I10 Essential (primary) hypertension: Secondary | ICD-10-CM | POA: Diagnosis not present

## 2021-07-04 DIAGNOSIS — L89510 Pressure ulcer of right ankle, unstageable: Secondary | ICD-10-CM | POA: Diagnosis not present

## 2021-07-04 DIAGNOSIS — M810 Age-related osteoporosis without current pathological fracture: Secondary | ICD-10-CM | POA: Diagnosis not present

## 2021-07-04 DIAGNOSIS — E43 Unspecified severe protein-calorie malnutrition: Secondary | ICD-10-CM | POA: Diagnosis not present

## 2021-07-06 DIAGNOSIS — Z9181 History of falling: Secondary | ICD-10-CM | POA: Diagnosis not present

## 2021-07-06 DIAGNOSIS — M810 Age-related osteoporosis without current pathological fracture: Secondary | ICD-10-CM | POA: Diagnosis not present

## 2021-07-06 DIAGNOSIS — F329 Major depressive disorder, single episode, unspecified: Secondary | ICD-10-CM | POA: Diagnosis not present

## 2021-07-06 DIAGNOSIS — E43 Unspecified severe protein-calorie malnutrition: Secondary | ICD-10-CM | POA: Diagnosis not present

## 2021-07-06 DIAGNOSIS — L89510 Pressure ulcer of right ankle, unstageable: Secondary | ICD-10-CM | POA: Diagnosis not present

## 2021-07-06 DIAGNOSIS — D649 Anemia, unspecified: Secondary | ICD-10-CM | POA: Diagnosis not present

## 2021-07-06 DIAGNOSIS — I10 Essential (primary) hypertension: Secondary | ICD-10-CM | POA: Diagnosis not present

## 2021-07-09 DIAGNOSIS — F329 Major depressive disorder, single episode, unspecified: Secondary | ICD-10-CM | POA: Diagnosis not present

## 2021-07-09 DIAGNOSIS — L89510 Pressure ulcer of right ankle, unstageable: Secondary | ICD-10-CM | POA: Diagnosis not present

## 2021-07-09 DIAGNOSIS — D649 Anemia, unspecified: Secondary | ICD-10-CM | POA: Diagnosis not present

## 2021-07-09 DIAGNOSIS — I10 Essential (primary) hypertension: Secondary | ICD-10-CM | POA: Diagnosis not present

## 2021-07-09 DIAGNOSIS — M810 Age-related osteoporosis without current pathological fracture: Secondary | ICD-10-CM | POA: Diagnosis not present

## 2021-07-09 DIAGNOSIS — E43 Unspecified severe protein-calorie malnutrition: Secondary | ICD-10-CM | POA: Diagnosis not present

## 2021-07-13 DIAGNOSIS — M810 Age-related osteoporosis without current pathological fracture: Secondary | ICD-10-CM | POA: Diagnosis not present

## 2021-07-13 DIAGNOSIS — E43 Unspecified severe protein-calorie malnutrition: Secondary | ICD-10-CM | POA: Diagnosis not present

## 2021-07-13 DIAGNOSIS — L89510 Pressure ulcer of right ankle, unstageable: Secondary | ICD-10-CM | POA: Diagnosis not present

## 2021-07-13 DIAGNOSIS — D649 Anemia, unspecified: Secondary | ICD-10-CM | POA: Diagnosis not present

## 2021-07-13 DIAGNOSIS — I10 Essential (primary) hypertension: Secondary | ICD-10-CM | POA: Diagnosis not present

## 2021-07-13 DIAGNOSIS — F329 Major depressive disorder, single episode, unspecified: Secondary | ICD-10-CM | POA: Diagnosis not present

## 2021-07-15 DIAGNOSIS — G8929 Other chronic pain: Secondary | ICD-10-CM | POA: Diagnosis not present

## 2021-07-16 DIAGNOSIS — I10 Essential (primary) hypertension: Secondary | ICD-10-CM | POA: Diagnosis not present

## 2021-07-16 DIAGNOSIS — E43 Unspecified severe protein-calorie malnutrition: Secondary | ICD-10-CM | POA: Diagnosis not present

## 2021-07-16 DIAGNOSIS — F329 Major depressive disorder, single episode, unspecified: Secondary | ICD-10-CM | POA: Diagnosis not present

## 2021-07-16 DIAGNOSIS — D649 Anemia, unspecified: Secondary | ICD-10-CM | POA: Diagnosis not present

## 2021-07-16 DIAGNOSIS — L89510 Pressure ulcer of right ankle, unstageable: Secondary | ICD-10-CM | POA: Diagnosis not present

## 2021-07-16 DIAGNOSIS — M810 Age-related osteoporosis without current pathological fracture: Secondary | ICD-10-CM | POA: Diagnosis not present

## 2021-07-21 DIAGNOSIS — L89510 Pressure ulcer of right ankle, unstageable: Secondary | ICD-10-CM | POA: Diagnosis not present

## 2021-07-21 DIAGNOSIS — I10 Essential (primary) hypertension: Secondary | ICD-10-CM | POA: Diagnosis not present

## 2021-07-21 DIAGNOSIS — D649 Anemia, unspecified: Secondary | ICD-10-CM | POA: Diagnosis not present

## 2021-07-21 DIAGNOSIS — E43 Unspecified severe protein-calorie malnutrition: Secondary | ICD-10-CM | POA: Diagnosis not present

## 2021-07-21 DIAGNOSIS — M810 Age-related osteoporosis without current pathological fracture: Secondary | ICD-10-CM | POA: Diagnosis not present

## 2021-07-21 DIAGNOSIS — F329 Major depressive disorder, single episode, unspecified: Secondary | ICD-10-CM | POA: Diagnosis not present

## 2021-07-22 DIAGNOSIS — F331 Major depressive disorder, recurrent, moderate: Secondary | ICD-10-CM | POA: Diagnosis not present

## 2021-07-22 DIAGNOSIS — F419 Anxiety disorder, unspecified: Secondary | ICD-10-CM | POA: Diagnosis not present

## 2021-07-22 DIAGNOSIS — F339 Major depressive disorder, recurrent, unspecified: Secondary | ICD-10-CM | POA: Diagnosis not present

## 2021-07-22 DIAGNOSIS — E559 Vitamin D deficiency, unspecified: Secondary | ICD-10-CM | POA: Diagnosis not present

## 2021-07-22 DIAGNOSIS — E038 Other specified hypothyroidism: Secondary | ICD-10-CM | POA: Diagnosis not present

## 2021-07-22 DIAGNOSIS — M1991 Primary osteoarthritis, unspecified site: Secondary | ICD-10-CM | POA: Diagnosis not present

## 2021-07-22 DIAGNOSIS — D509 Iron deficiency anemia, unspecified: Secondary | ICD-10-CM | POA: Diagnosis not present

## 2021-07-22 DIAGNOSIS — K219 Gastro-esophageal reflux disease without esophagitis: Secondary | ICD-10-CM | POA: Diagnosis not present

## 2021-07-22 DIAGNOSIS — I10 Essential (primary) hypertension: Secondary | ICD-10-CM | POA: Diagnosis not present

## 2021-07-24 DIAGNOSIS — M810 Age-related osteoporosis without current pathological fracture: Secondary | ICD-10-CM | POA: Diagnosis not present

## 2021-07-24 DIAGNOSIS — L89510 Pressure ulcer of right ankle, unstageable: Secondary | ICD-10-CM | POA: Diagnosis not present

## 2021-07-24 DIAGNOSIS — D649 Anemia, unspecified: Secondary | ICD-10-CM | POA: Diagnosis not present

## 2021-07-24 DIAGNOSIS — F329 Major depressive disorder, single episode, unspecified: Secondary | ICD-10-CM | POA: Diagnosis not present

## 2021-07-24 DIAGNOSIS — E43 Unspecified severe protein-calorie malnutrition: Secondary | ICD-10-CM | POA: Diagnosis not present

## 2021-07-24 DIAGNOSIS — I10 Essential (primary) hypertension: Secondary | ICD-10-CM | POA: Diagnosis not present

## 2021-07-28 DIAGNOSIS — E43 Unspecified severe protein-calorie malnutrition: Secondary | ICD-10-CM | POA: Diagnosis not present

## 2021-07-28 DIAGNOSIS — F329 Major depressive disorder, single episode, unspecified: Secondary | ICD-10-CM | POA: Diagnosis not present

## 2021-07-28 DIAGNOSIS — L89510 Pressure ulcer of right ankle, unstageable: Secondary | ICD-10-CM | POA: Diagnosis not present

## 2021-07-28 DIAGNOSIS — M810 Age-related osteoporosis without current pathological fracture: Secondary | ICD-10-CM | POA: Diagnosis not present

## 2021-07-28 DIAGNOSIS — I10 Essential (primary) hypertension: Secondary | ICD-10-CM | POA: Diagnosis not present

## 2021-07-28 DIAGNOSIS — D649 Anemia, unspecified: Secondary | ICD-10-CM | POA: Diagnosis not present

## 2021-07-31 DIAGNOSIS — D649 Anemia, unspecified: Secondary | ICD-10-CM | POA: Diagnosis not present

## 2021-07-31 DIAGNOSIS — L89510 Pressure ulcer of right ankle, unstageable: Secondary | ICD-10-CM | POA: Diagnosis not present

## 2021-07-31 DIAGNOSIS — F329 Major depressive disorder, single episode, unspecified: Secondary | ICD-10-CM | POA: Diagnosis not present

## 2021-07-31 DIAGNOSIS — I10 Essential (primary) hypertension: Secondary | ICD-10-CM | POA: Diagnosis not present

## 2021-07-31 DIAGNOSIS — M810 Age-related osteoporosis without current pathological fracture: Secondary | ICD-10-CM | POA: Diagnosis not present

## 2021-07-31 DIAGNOSIS — E43 Unspecified severe protein-calorie malnutrition: Secondary | ICD-10-CM | POA: Diagnosis not present

## 2021-08-04 DIAGNOSIS — E43 Unspecified severe protein-calorie malnutrition: Secondary | ICD-10-CM | POA: Diagnosis not present

## 2021-08-04 DIAGNOSIS — F329 Major depressive disorder, single episode, unspecified: Secondary | ICD-10-CM | POA: Diagnosis not present

## 2021-08-04 DIAGNOSIS — M810 Age-related osteoporosis without current pathological fracture: Secondary | ICD-10-CM | POA: Diagnosis not present

## 2021-08-04 DIAGNOSIS — D649 Anemia, unspecified: Secondary | ICD-10-CM | POA: Diagnosis not present

## 2021-08-04 DIAGNOSIS — L89510 Pressure ulcer of right ankle, unstageable: Secondary | ICD-10-CM | POA: Diagnosis not present

## 2021-08-04 DIAGNOSIS — I10 Essential (primary) hypertension: Secondary | ICD-10-CM | POA: Diagnosis not present

## 2021-08-12 DIAGNOSIS — G894 Chronic pain syndrome: Secondary | ICD-10-CM | POA: Diagnosis not present

## 2021-08-19 DIAGNOSIS — E038 Other specified hypothyroidism: Secondary | ICD-10-CM | POA: Diagnosis not present

## 2021-08-19 DIAGNOSIS — E559 Vitamin D deficiency, unspecified: Secondary | ICD-10-CM | POA: Diagnosis not present

## 2021-08-19 DIAGNOSIS — I10 Essential (primary) hypertension: Secondary | ICD-10-CM | POA: Diagnosis not present

## 2021-08-19 DIAGNOSIS — F331 Major depressive disorder, recurrent, moderate: Secondary | ICD-10-CM | POA: Diagnosis not present

## 2021-08-19 DIAGNOSIS — D509 Iron deficiency anemia, unspecified: Secondary | ICD-10-CM | POA: Diagnosis not present

## 2021-08-19 DIAGNOSIS — K219 Gastro-esophageal reflux disease without esophagitis: Secondary | ICD-10-CM | POA: Diagnosis not present

## 2021-08-19 DIAGNOSIS — F419 Anxiety disorder, unspecified: Secondary | ICD-10-CM | POA: Diagnosis not present

## 2021-08-19 DIAGNOSIS — M1991 Primary osteoarthritis, unspecified site: Secondary | ICD-10-CM | POA: Diagnosis not present

## 2022-10-05 ENCOUNTER — Emergency Department (HOSPITAL_COMMUNITY)
Admission: EM | Admit: 2022-10-05 | Discharge: 2022-10-05 | Disposition: A | Discharge status: Skilled Nursing Facility | Payer: Medicare Other | Attending: Emergency Medicine | Admitting: Emergency Medicine

## 2022-10-05 ENCOUNTER — Emergency Department (HOSPITAL_COMMUNITY): Payer: Medicare Other

## 2022-10-05 DIAGNOSIS — M533 Sacrococcygeal disorders, not elsewhere classified: Secondary | ICD-10-CM | POA: Insufficient documentation

## 2022-10-05 DIAGNOSIS — M25551 Pain in right hip: Secondary | ICD-10-CM | POA: Insufficient documentation

## 2022-10-05 DIAGNOSIS — I10 Essential (primary) hypertension: Secondary | ICD-10-CM | POA: Diagnosis not present

## 2022-10-05 DIAGNOSIS — M25552 Pain in left hip: Secondary | ICD-10-CM | POA: Insufficient documentation

## 2022-10-05 DIAGNOSIS — Y92129 Unspecified place in nursing home as the place of occurrence of the external cause: Secondary | ICD-10-CM | POA: Insufficient documentation

## 2022-10-05 DIAGNOSIS — Z7982 Long term (current) use of aspirin: Secondary | ICD-10-CM | POA: Insufficient documentation

## 2022-10-05 DIAGNOSIS — Z79899 Other long term (current) drug therapy: Secondary | ICD-10-CM | POA: Diagnosis not present

## 2022-10-05 DIAGNOSIS — W19XXXA Unspecified fall, initial encounter: Secondary | ICD-10-CM

## 2022-10-05 DIAGNOSIS — S40012A Contusion of left shoulder, initial encounter: Secondary | ICD-10-CM | POA: Diagnosis present

## 2022-10-05 MED ORDER — ACETAMINOPHEN 500 MG PO TABS
1000.0000 mg | ORAL_TABLET | Freq: Once | ORAL | Status: AC
  Administered 2022-10-05: 1000 mg via ORAL
  Filled 2022-10-05: qty 2

## 2022-10-05 NOTE — Discharge Instructions (Signed)
X-rays of the pelvis are normal, no evidence of any fracture in your shoulder.  The left foot may have a minor crack but you can still walk and do not need any braces.  You can wear a shoe with a good sole and that is all that is needed.  Use extra strength Tylenol 2 tablets every 6 hours as needed for pain

## 2022-10-05 NOTE — ED Provider Notes (Signed)
[facility] EMERGENCY DEPARTMENT AT [facility] Provider Note   CSN: [id number] Arrival date & time: 10/05/22  1110     History  Chief Complaint  Patient presents with   Fall   Tailbone Pain    Holly Salazar is a 87 y.o. female.  Patient is an 87 year old female with a history of hypertension, prior falls resulting in hip fracture bilaterally who is presenting today after a fall from her wheelchair.  This was a witnessed fall where she was trying to get up and slipped falling to the floor.  Patient initially complaining of pain in her coccyx area and hips.  Patient is tearful on exam and reports she does not think she hit her head and she did not lose consciousness but is complaining of discomfort in her hips and buttocks.  Patient does not take anticoagulation.  The history is provided by the patient, the EMS personnel and medical records.  Fall       Home Medications Prior to Admission medications   Medication Sig Start Date End Date Taking? Authorizing Provider  aspirin EC 325 MG EC tablet Take 1 tablet (325 mg total) by mouth daily with breakfast. 10/30/19   Caren Griffins, MD  Calcium Carb-Cholecalciferol (CALCIUM 500+D) 500-200 MG-UNIT TABS Take 1 tablet by mouth in the morning and at bedtime.    [provider]  clonazepam (KLONOPIN) 0.125 MG disintegrating tablet Take 0.125 mg by mouth 3 (three) times daily.    [provider]  escitalopram (LEXAPRO) 10 MG tablet Take 10 mg by mouth at bedtime.    [provider]  ferrous sulfate 325 (65 FE) MG tablet Take 325 mg by mouth daily with breakfast.    [provider]  HYDROcodone-acetaminophen (NORCO/VICODIN) 5-325 MG tablet Take 1-2 tablets by mouth every 6 (six) hours as needed for moderate pain. 10/29/19   Caren Griffins, MD  lisinopril (ZESTRIL) 10 MG tablet Take 10 mg by mouth daily.     [provider]  mirtazapine (REMERON) 7.5 MG tablet Take 7.5 mg by mouth at  bedtime.    [provider]  Multiple Vitamin (MULTIVITAMIN WITH MINERALS) TABS tablet Take 1 tablet by mouth daily.    [provider]  naproxen sodium (ANAPROX) 220 MG tablet Take 220 mg by mouth daily as needed (pain).    [provider]  pantoprazole (PROTONIX) 40 MG tablet Take 1 tablet (40 mg total) by mouth daily. 10/29/19 10/28/20  Caren Griffins, MD  polyethylene glycol (MIRALAX / GLYCOLAX) 17 g packet Take 17 g by mouth daily.    [provider]  sennosides-docusate sodium (SENOKOT-S) 8.6-50 MG tablet Take 1 tablet by mouth daily.    [provider]      Allergies    Patient has no known allergies.    Review of Systems   Review of Systems  Physical Exam Updated Vital Signs BP (!) 142/61 (BP Location: Left Arm)   Pulse 76   Temp 98 F (36.7 C) (Axillary)   Resp 18   SpO2 100%  Physical Exam Vitals and nursing note reviewed.  Constitutional:      General: She is not in acute distress.    Appearance: She is well-developed.     Comments: Tearful on exam  HENT:     Head: Normocephalic and atraumatic.  Eyes:     Pupils: Pupils are equal, round, and reactive to light.  Cardiovascular:     Rate and Rhythm: Normal rate  and regular rhythm.     Pulses: Normal pulses.     Heart sounds: Normal heart sounds. No murmur heard.    No friction rub.  Pulmonary:     Effort: Pulmonary effort is normal.     Breath sounds: Normal breath sounds. No wheezing or rales.  Abdominal:     General: Bowel sounds are normal. There is no distension.     Palpations: Abdomen is soft.     Tenderness: There is no abdominal tenderness. There is no guarding or rebound.  Musculoskeletal:        General: Tenderness present. Normal range of motion.       Arms:     Cervical back: Normal range of motion and neck supple. No tenderness.     Comments: No edema.  Mild generalized tenderness with palpation of the hips but no focal pain or deformity.  Patient is  able to flex and extend bilateral hips and was noted to walk with her walker with EMS  Skin:    General: Skin is warm and dry.     Findings: No rash.  Neurological:     Mental Status: She is alert and oriented to person, place, and time. Mental status is at baseline.     Cranial Nerves: No cranial nerve deficit.  Psychiatric:        Behavior: Behavior normal.     ED Results / Procedures / Treatments   Labs (all labs ordered are listed, but only abnormal results are displayed) Labs Reviewed - No data to display  EKG None  Radiology DG Foot Complete Left  Result Date: 10/05/2022 CLINICAL DATA:  Fall.  Pain. EXAM: LEFT FOOT - COMPLETE 3+ VIEW COMPARISON:  None Available. FINDINGS: Bones are markedly demineralized. Cortical irregularity in the neck of the fifth metatarsal is concerning for nondisplaced fracture. No other acute fracture evident. No subluxation or dislocation. No worrisome lytic or sclerotic osseous abnormality. IMPRESSION: Cortical irregularity in the neck of the fifth metatarsal is concerning for nondisplaced fracture. Electronically Signed   By: Misty Stanley M.D.   On: 10/05/2022 12:53   DG Shoulder Left Portable  Result Date: 10/05/2022 CLINICAL DATA:  Pain after fall EXAM: LEFT SHOULDER three views portable COMPARISON:  None Available. FINDINGS: Osteopenia. No fracture or dislocation. Preserved glenohumeral joint. Moderate joint space loss of the [facility] joint with some osteophytes. Electronically Signed   By: Jill Side M.D.   On: 10/05/2022 12:52   DG Pelvis Portable  Result Date: 10/05/2022 CLINICAL DATA:  Fall today.  History of arthritis. EXAM: PORTABLE PELVIS 1-2 VIEWS COMPARISON:  12/08/2019 FINDINGS: Prior ORIF of both hips. There are degenerative changes in both hips. There is no acute fracture or subluxation. No radiopaque foreign body or soft tissue gas. Incidental note is made of significant stool burden in the rectosigmoid colon. IMPRESSION: 1. Prior ORIF of  both hips. No evidence for acute abnormality. 2. Significant stool burden in the rectosigmoid colon. Electronically Signed   By: Nolon Nations M.D.   On: 10/05/2022 12:51    Procedures Procedures    Medications Ordered in ED Medications  acetaminophen (TYLENOL) tablet 1,000 mg (1,000 mg Oral Given 10/05/22 1203)    ED Course/ Medical Decision Making/ A&P                             Medical Decision Making Amount and/or Complexity of Data Reviewed Radiology: ordered and independent interpretation performed. Decision-making details  documented in ED Course.  Risk OTC drugs.   Patient presenting today with EMS after a ground-level fall from her wheelchair.  Patient is awake and alert denies hitting her head.  She does have bruising to the left shoulder and complaint of pain in the hips.  Patient does not take anticoagulation.  Low suspicion for head injury.  Will image the left shoulder, pelvis and her foot.  Patient is neurovascularly intact at this time.  Low suspicion for femur fracture as patient has had surgery bilaterally lower suspicion for hardware abnormality as well.  Patient given Tylenol.  Imaging pending.  No indication for head imaging at this time.  I have independently visualized and interpreted pt's images today.  Pelvis images negative for hardware abnormality or acute fracture.  Foot imaging is negative but radiology reports possible cortical abnormality of the 5th metatarsal injury however on exam patient has no pain with palpation.  Left shoulder is negative.  At this time feel that patient is stable for discharge home.  She can ambulate with a good shoe support but does not need any further intervention.         Final Clinical Impression(s) / ED Diagnoses Final diagnoses:  Fall, initial encounter    Rx / DC Orders ED Discharge Orders     None         Blanchie Dessert, MD 10/05/22 1337

## 2022-10-30 ENCOUNTER — Emergency Department (HOSPITAL_COMMUNITY): Payer: Medicare Other

## 2022-10-30 ENCOUNTER — Emergency Department (HOSPITAL_COMMUNITY)
Admission: EM | Admit: 2022-10-30 | Discharge: 2022-10-30 | Disposition: A | Discharge status: Home / Self Care | Payer: Medicare Other | Attending: Emergency Medicine | Admitting: Emergency Medicine

## 2022-10-30 ENCOUNTER — Encounter (HOSPITAL_COMMUNITY): Payer: Self-pay

## 2022-10-30 ENCOUNTER — Other Ambulatory Visit: Payer: Self-pay

## 2022-10-30 DIAGNOSIS — Z79899 Other long term (current) drug therapy: Secondary | ICD-10-CM | POA: Diagnosis not present

## 2022-10-30 DIAGNOSIS — W01198A Fall on same level from slipping, tripping and stumbling with subsequent striking against other object, initial encounter: Secondary | ICD-10-CM | POA: Insufficient documentation

## 2022-10-30 DIAGNOSIS — M79672 Pain in left foot: Secondary | ICD-10-CM | POA: Insufficient documentation

## 2022-10-30 DIAGNOSIS — M79671 Pain in right foot: Secondary | ICD-10-CM | POA: Insufficient documentation

## 2022-10-30 DIAGNOSIS — I10 Essential (primary) hypertension: Secondary | ICD-10-CM | POA: Diagnosis not present

## 2022-10-30 DIAGNOSIS — Y9301 Activity, walking, marching and hiking: Secondary | ICD-10-CM | POA: Diagnosis not present

## 2022-10-30 DIAGNOSIS — W19XXXA Unspecified fall, initial encounter: Secondary | ICD-10-CM

## 2022-10-30 DIAGNOSIS — S0990XA Unspecified injury of head, initial encounter: Secondary | ICD-10-CM | POA: Diagnosis present

## 2022-10-30 DIAGNOSIS — S0083XA Contusion of other part of head, initial encounter: Secondary | ICD-10-CM | POA: Diagnosis not present

## 2022-10-30 DIAGNOSIS — Z7982 Long term (current) use of aspirin: Secondary | ICD-10-CM | POA: Diagnosis not present

## 2022-10-30 NOTE — ED Triage Notes (Signed)
Patient arrives to ED BIB [facility] coming from [facility] due to a fall. Per EMS pt had 2 unwitnessed fall over the past hour. Pt has a hematoma to the forehead. Denies LOC, No blood thinners. Pt c/o Right heel pain and RT shoulder pain. Pt A/O to self and place. Pt very emotional.  BP 124/78 HR 90 R 17 CBG 292

## 2022-10-30 NOTE — ED Provider Notes (Signed)
[place] EMERGENCY DEPARTMENT AT [facility] Provider Note   CSN: [id number] Arrival date & time: 10/30/22  2006     History  Chief Complaint  Patient presents with   Holly Salazar is a 87 y.o. female.  Patient is an 87 year old female with past medical history of hypertension and depression presenting to the emergency department after a fall.  Per EMS, she had 2 unwitnessed falls at her assisted living today and hit her head.  The patient reports that she was trying to walk to the bathroom with her walker and thinks that she may have slipped and hit her head on the shower.  She denies loss of consciousness.  She denies any nausea or vomiting, numbness or weakness since the fall.  She states that she had no lightheadedness or dizziness, chest pain or shortness of breath prior to falling.  She denies any blood thinner use.  She states that she is having pain in her head as well as her bilateral heels.  The history is provided by the patient and the EMS personnel.  Fall       Home Medications Prior to Admission medications   Medication Sig Start Date End Date Taking? Authorizing Provider  aspirin EC 325 MG EC tablet Take 1 tablet (325 mg total) by mouth daily with breakfast. 10/30/19   Caren Griffins, MD  Calcium Carb-Cholecalciferol (CALCIUM 500+D) 500-200 MG-UNIT TABS Take 1 tablet by mouth in the morning and at bedtime.    [provider]  clonazepam (KLONOPIN) 0.125 MG disintegrating tablet Take 0.125 mg by mouth 3 (three) times daily.    [provider]  escitalopram (LEXAPRO) 10 MG tablet Take 10 mg by mouth at bedtime.    [provider]  ferrous sulfate 325 (65 FE) MG tablet Take 325 mg by mouth daily with breakfast.    [provider]  HYDROcodone-acetaminophen (NORCO/VICODIN) 5-325 MG tablet Take 1-2 tablets by mouth every 6 (six) hours as needed for moderate pain. 10/29/19   Caren Griffins, MD  lisinopril (ZESTRIL)  10 MG tablet Take 10 mg by mouth daily.     [provider]  mirtazapine (REMERON) 7.5 MG tablet Take 7.5 mg by mouth at bedtime.    [provider]  Multiple Vitamin (MULTIVITAMIN WITH MINERALS) TABS tablet Take 1 tablet by mouth daily.    [provider]  naproxen sodium (ANAPROX) 220 MG tablet Take 220 mg by mouth daily as needed (pain).    [provider]  pantoprazole (PROTONIX) 40 MG tablet Take 1 tablet (40 mg total) by mouth daily. 10/29/19 10/28/20  Caren Griffins, MD  polyethylene glycol (MIRALAX / GLYCOLAX) 17 g packet Take 17 g by mouth daily.    [provider]  sennosides-docusate sodium (SENOKOT-S) 8.6-50 MG tablet Take 1 tablet by mouth daily.    [provider]      Allergies    Patient has no known allergies.    Review of Systems   Review of Systems  Physical Exam Updated Vital Signs BP (!) 127/43 (BP Location: Left Arm)   Pulse 69   Temp 97.7 F (36.5 C) (Oral)   Resp 15   SpO2 98%  Physical Exam Vitals and nursing note reviewed.  Constitutional:      General: She is not in acute distress.    Appearance: Normal appearance.  HENT:     Head: Normocephalic.     Comments: Left forehead hematoma, no  bleeding No facial bony tenderness to palpation    Nose: Nose normal.     Mouth/Throat:     Mouth: Mucous membranes are moist.     Pharynx: Oropharynx is clear.  Eyes:     Extraocular Movements: Extraocular movements intact.     Conjunctiva/sclera: Conjunctivae normal.     Pupils: Pupils are equal, round, and reactive to light.  Neck:     Comments: No midline neck tenderness Cardiovascular:     Rate and Rhythm: Normal rate and regular rhythm.     Heart sounds: Normal heart sounds.  Pulmonary:     Effort: Pulmonary effort is normal.     Breath sounds: Normal breath sounds.  Abdominal:     General: Abdomen is flat.     Palpations: Abdomen is soft.     Tenderness: There is no abdominal tenderness.   Musculoskeletal:        General: Normal range of motion.     Cervical back: Normal range of motion and neck supple.     Comments: No bony tenderness to bilateral UE Tenderness to palpation of bilateral heels, no overlying swelling or deformity  Pelvis stable, non-tender No midline back tenderness  Skin:    General: Skin is warm and dry.  Neurological:     General: No focal deficit present.     Mental Status: She is alert and oriented to person, place, and time.     Cranial Nerves: No cranial nerve deficit.     Sensory: No sensory deficit.     Motor: No weakness.  Psychiatric:        Mood and Affect: Mood normal.        Behavior: Behavior normal.     ED Results / Procedures / Treatments   Labs (all labs ordered are listed, but only abnormal results are displayed) Labs Reviewed  CBG MONITORING, ED    EKG EKG Interpretation  Date/Time:  Friday October 30 2022 22:00:12 EST Ventricular Rate:  55 PR Interval:  203 QRS Duration: 79 QT Interval:  436 QTC Calculation: 417 R Axis:   70 Text Interpretation: Sinus rhythm Consider left atrial enlargement Anteroseptal infarct, age indeterminate ST elevation, consider inferior injury Lateral leads are also involved No significant change since last tracing Confirmed by Leanord Asal ([id number]) on 10/30/2022 10:51:09 PM  Radiology CT Head Wo Contrast  Result Date: 10/30/2022 CLINICAL DATA:  [place] on room and hit forehead on floor EXAM: CT HEAD WITHOUT CONTRAST CT CERVICAL SPINE WITHOUT CONTRAST TECHNIQUE: Multidetector CT imaging of the head and cervical spine was performed following the standard protocol without intravenous contrast. Multiplanar CT image reconstructions of the cervical spine were also generated. RADIATION DOSE REDUCTION: This exam was performed according to the departmental dose-optimization program which includes automated exposure control, adjustment of the mA and/or kV according to patient size and/or use of iterative  reconstruction technique. COMPARISON:  CT head 11/23/2019 FINDINGS: CT HEAD FINDINGS Brain: No intracranial hemorrhage, mass effect, or evidence of acute infarct. No hydrocephalus. No extra-axial fluid collection. Generalized cerebral atrophy. Ill-defined hypoattenuation within the cerebral white matter is nonspecific but consistent with chronic small vessel ischemic disease. Vascular: No hyperdense vessel. Intracranial arterial calcification. Skull: No fracture or focal lesion.  Left forehead scalp hematoma. Sinuses/Orbits: No acute finding. Other: None. CT CERVICAL SPINE FINDINGS Alignment: No traumatic malalignment. Skull base and vertebrae: No acute fracture. No primary bone lesion or focal pathologic process. Soft tissues and spinal canal: No prevertebral fluid or swelling. No visible canal hematoma.  Disc levels: Multilevel spondylosis, degenerative endplate change, disc space height loss greatest at C5-C6, C6-C7, and C7-T1 where it is moderate-advanced. Multilevel cervical facet arthropathy. Posterior disc osteophyte complexes cause multilevel effacement of the ventral thecal sac greatest at C6-C7 where it is mild. Uncovertebral spurring and facet arthropathy cause advanced neural foraminal narrowing on the right at C4-C5, bilaterally at C5-C6, and on the left at C6-C7. Upper chest: Negative. Other: None. IMPRESSION: 1. No acute intracranial abnormality. Generalized atrophy and small vessel white matter disease. 2. No acute fracture in the cervical spine. Multilevel degenerative spondylosis. 3. Left forehead scalp hematoma.  No underlying calvarial fracture. Electronically Signed   By: Placido Sou M.D.   On: 10/30/2022 22:13   CT Cervical Spine Wo Contrast  Result Date: 10/30/2022 CLINICAL DATA:  [place] on room and hit forehead on floor EXAM: CT HEAD WITHOUT CONTRAST CT CERVICAL SPINE WITHOUT CONTRAST TECHNIQUE: Multidetector CT imaging of the head and cervical spine was performed following the standard  protocol without intravenous contrast. Multiplanar CT image reconstructions of the cervical spine were also generated. RADIATION DOSE REDUCTION: This exam was performed according to the departmental dose-optimization program which includes automated exposure control, adjustment of the mA and/or kV according to patient size and/or use of iterative reconstruction technique. COMPARISON:  CT head 11/23/2019 FINDINGS: CT HEAD FINDINGS Brain: No intracranial hemorrhage, mass effect, or evidence of acute infarct. No hydrocephalus. No extra-axial fluid collection. Generalized cerebral atrophy. Ill-defined hypoattenuation within the cerebral white matter is nonspecific but consistent with chronic small vessel ischemic disease. Vascular: No hyperdense vessel. Intracranial arterial calcification. Skull: No fracture or focal lesion.  Left forehead scalp hematoma. Sinuses/Orbits: No acute finding. Other: None. CT CERVICAL SPINE FINDINGS Alignment: No traumatic malalignment. Skull base and vertebrae: No acute fracture. No primary bone lesion or focal pathologic process. Soft tissues and spinal canal: No prevertebral fluid or swelling. No visible canal hematoma. Disc levels: Multilevel spondylosis, degenerative endplate change, disc space height loss greatest at C5-C6, C6-C7, and C7-T1 where it is moderate-advanced. Multilevel cervical facet arthropathy. Posterior disc osteophyte complexes cause multilevel effacement of the ventral thecal sac greatest at C6-C7 where it is mild. Uncovertebral spurring and facet arthropathy cause advanced neural foraminal narrowing on the right at C4-C5, bilaterally at C5-C6, and on the left at C6-C7. Upper chest: Negative. Other: None. IMPRESSION: 1. No acute intracranial abnormality. Generalized atrophy and small vessel white matter disease. 2. No acute fracture in the cervical spine. Multilevel degenerative spondylosis. 3. Left forehead scalp hematoma.  No underlying calvarial fracture.  Electronically Signed   By: Placido Sou M.D.   On: 10/30/2022 22:13   DG Chest 1 View  Result Date: 10/30/2022 CLINICAL DATA:  Fall EXAM: CHEST  1 VIEW COMPARISON:  Chest x-ray 11/23/2019 FINDINGS: The heart size and mediastinal contours are within normal limits. Both lungs are clear. The visualized skeletal structures are unremarkable. IMPRESSION: No active disease. Electronically Signed   By: Ronney Asters M.D.   On: 10/30/2022 22:07   DG Foot Complete Left  Result Date: 10/30/2022 CLINICAL DATA:  Fall EXAM: LEFT FOOT - COMPLETE 3+ VIEW; RIGHT FOOT COMPLETE - 3+ VIEW COMPARISON:  10/05/2022 FINDINGS: Left foot: Demineralization. Slightly increased sclerosis about the irregularity of the distal fifth metatarsal head suggesting healing change about a subacute fracture. Remote fracture of the first toe distal phalanx. No evidence of new fracture or dislocation. Right foot: Demineralization. No evidence of acute fracture or dislocation. IMPRESSION: No acute fracture or dislocation in the bilateral feet.  Redemonstrated subacute left fifth metatarsal head fracture. Electronically Signed   By: Placido Sou M.D.   On: 10/30/2022 22:03   DG Foot Complete Right  Result Date: 10/30/2022 CLINICAL DATA:  Fall EXAM: LEFT FOOT - COMPLETE 3+ VIEW; RIGHT FOOT COMPLETE - 3+ VIEW COMPARISON:  10/05/2022 FINDINGS: Left foot: Demineralization. Slightly increased sclerosis about the irregularity of the distal fifth metatarsal head suggesting healing change about a subacute fracture. Remote fracture of the first toe distal phalanx. No evidence of new fracture or dislocation. Right foot: Demineralization. No evidence of acute fracture or dislocation. IMPRESSION: No acute fracture or dislocation in the bilateral feet. Redemonstrated subacute left fifth metatarsal head fracture. Electronically Signed   By: Placido Sou M.D.   On: 10/30/2022 22:03   DG Pelvis Portable  Result Date: 10/30/2022 CLINICAL DATA:  Fall EXAM:  PORTABLE PELVIS 1-2 VIEWS COMPARISON:  10/05/2022 FINDINGS: Intramedullary rod and screw fixations of the proximal femurs bilaterally. No radiographic evidence of loosening. Demineralization. No definite acute fracture. No dislocation. Degenerative changes pubic symphysis, both hips, SI joints and lower lumbar spine. Moderate stool in the rectum. IMPRESSION: No acute fracture or dislocation. Electronically Signed   By: Placido Sou M.D.   On: 10/30/2022 21:51    Procedures Procedures    Medications Ordered in ED Medications - No data to display  ED Course/ Medical Decision Making/ A&P                             Medical Decision Making This patient presents to the ED with chief complaint(s) of fall with pertinent past medical history of HTN, depression which further complicates the presenting complaint. The complaint involves an extensive differential diagnosis and also carries with it a high risk of complications and morbidity.    The differential diagnosis includes ICH, mass effect, cervical spine fracture, bilateral foot fracture, no other traumatic injury seen on exam, patient denies any pre-syncopal symptoms making syncopal fall unlikely   Additional history obtained: Additional history obtained from EMS  Records reviewed [place]  ED Course and Reassessment: Patient was awake and well-appearing on arrival no acute distress.  Due to patient's age and evidence of head trauma she will have head CT and C-spine performed to evaluate for traumatic injury.  She complained of shoulder pain to triage but denies any shoulder pain to me and has no tenderness to palpation, chest x-ray will be performed to evaluate for thoracic trauma and she will have bilateral foot x-rays performed for her foot pain.  She was given Tylenol for pain and she will be closely reassessed.  Independent labs interpretation:  The following labs were independently interpreted: N/A  Independent  visualization of imaging: - I independently visualized the following imaging with scope of interpretation limited to determining acute life threatening conditions related to emergency care: CTH/C-spine, CXR, bilateral foot XR, which revealed no acute traumatic injury  Consultation: - Consulted or discussed management/test interpretation w/ external professional: N/A  Consideration for admission or further workup: Patient has no emergent conditions requiring admission or further work-up at this time and is stable for discharge home with primary care follow-up  Social Determinants of health: N/A    Amount and/or Complexity of Data Reviewed Radiology: ordered.          Final Clinical Impression(s) / ED Diagnoses Final diagnoses:  Fall, initial encounter  Traumatic hematoma of forehead, initial encounter    Rx / DC Orders ED  Discharge Orders     None         Kemper Durie, [place] 10/30/22 2252

## 2022-10-30 NOTE — Discharge Instructions (Signed)
You were seen in the emergency department for your fall.  Your x-rays and CTs showed no broken bones or obvious injuries from your fall.  You did have a hematoma to your forehead and you can ice this for 20 minutes at a time several times a day to help with the swelling and take Tylenol or Motrin as needed for pain.  He should follow-up with your primary doctor in the next few days to have your symptoms rechecked.  You should return to the emergency department for severe headaches, repetitive vomiting, numbness or weakness on one side of the body compared to the other or if you have any other new or concerning symptoms.

## 2023-09-22 ENCOUNTER — Emergency Department (HOSPITAL_COMMUNITY)
Admission: EM | Admit: 2023-09-22 | Discharge: 2023-09-23 | Disposition: A | Discharge status: Home / Self Care | Payer: Medicare Other | Attending: Emergency Medicine | Admitting: Emergency Medicine

## 2023-09-22 DIAGNOSIS — Z20822 Contact with and (suspected) exposure to covid-19: Secondary | ICD-10-CM | POA: Insufficient documentation

## 2023-09-22 DIAGNOSIS — K529 Noninfective gastroenteritis and colitis, unspecified: Secondary | ICD-10-CM | POA: Insufficient documentation

## 2023-09-22 DIAGNOSIS — R112 Nausea with vomiting, unspecified: Secondary | ICD-10-CM | POA: Diagnosis not present

## 2023-09-22 DIAGNOSIS — R197 Diarrhea, unspecified: Secondary | ICD-10-CM | POA: Diagnosis present

## 2023-09-22 DIAGNOSIS — I10 Essential (primary) hypertension: Secondary | ICD-10-CM | POA: Insufficient documentation

## 2023-09-22 DIAGNOSIS — F0390 Unspecified dementia without behavioral disturbance: Secondary | ICD-10-CM | POA: Diagnosis not present

## 2023-09-22 LAB — COMPREHENSIVE METABOLIC PANEL
ALT: 27 U/L (ref 0–44)
AST: 23 U/L (ref 15–41)
Albumin: 3.5 g/dL (ref 3.5–5.0)
Alkaline Phosphatase: 81 U/L (ref 38–126)
Anion gap: 13 (ref 5–15)
BUN: 38 mg/dL — ABNORMAL HIGH (ref 8–23)
CO2: 23 mmol/L (ref 22–32)
Calcium: 9.1 mg/dL (ref 8.9–10.3)
Chloride: 103 mmol/L (ref 98–111)
Creatinine, Ser: 1.02 mg/dL — ABNORMAL HIGH (ref 0.44–1.00)
GFR, Estimated: 54 mL/min — ABNORMAL LOW (ref >60)
Glucose, Bld: 165 mg/dL — ABNORMAL HIGH (ref 70–99)
Potassium: 4.2 mmol/L (ref 3.5–5.1)
Sodium: 139 mmol/L (ref 135–145)
Total Bilirubin: 0.6 mg/dL (ref 0.0–1.2)
Total Protein: 6.9 g/dL (ref 6.5–8.1)

## 2023-09-22 LAB — CBC WITH DIFFERENTIAL/PLATELET
Abs Immature Granulocytes: 0.03 10*3/uL (ref 0.00–0.07)
Basophils Absolute: 0 10*3/uL (ref 0.0–0.1)
Basophils Relative: 0 %
Eosinophils Absolute: 0 10*3/uL (ref 0.0–0.5)
Eosinophils Relative: 0 %
HCT: 35.1 % — ABNORMAL LOW (ref 36.0–46.0)
Hemoglobin: 11.5 g/dL — ABNORMAL LOW (ref 12.0–15.0)
Immature Granulocytes: 0 %
Lymphocytes Relative: 2 %
Lymphs Abs: 0.2 10*3/uL — ABNORMAL LOW (ref 0.7–4.0)
MCH: 31.4 pg (ref 26.0–34.0)
MCHC: 32.8 g/dL (ref 30.0–36.0)
MCV: 95.9 fL (ref 80.0–100.0)
Monocytes Absolute: 0.6 10*3/uL (ref 0.1–1.0)
Monocytes Relative: 6 %
Neutro Abs: 9 10*3/uL — ABNORMAL HIGH (ref 1.7–7.7)
Neutrophils Relative %: 92 %
Platelets: 310 10*3/uL (ref 150–400)
RBC: 3.66 MIL/uL — ABNORMAL LOW (ref 3.87–5.11)
RDW: 15.6 % — ABNORMAL HIGH (ref 11.5–15.5)
WBC: 9.9 10*3/uL (ref 4.0–10.5)
nRBC: 0 % (ref 0.0–0.2)

## 2023-09-22 LAB — RESP PANEL BY RT-PCR (RSV, FLU A&B, COVID)  RVPGX2
Influenza A by PCR: NEGATIVE
Influenza B by PCR: NEGATIVE
Resp Syncytial Virus by PCR: NEGATIVE
SARS Coronavirus 2 by RT PCR: NEGATIVE

## 2023-09-22 LAB — LIPASE, BLOOD: Lipase: 35 U/L (ref 11–51)

## 2023-09-22 LAB — C DIFFICILE QUICK SCREEN W PCR REFLEX
C Diff antigen: NEGATIVE
C Diff interpretation: NOT DETECTED
C Diff toxin: NEGATIVE

## 2023-09-22 MED ORDER — ONDANSETRON HCL 4 MG/2ML IJ SOLN
4.0000 mg | Freq: Once | INTRAMUSCULAR | Status: AC
  Administered 2023-09-22: 4 mg via INTRAVENOUS
  Filled 2023-09-22: qty 2

## 2023-09-22 MED ORDER — LACTATED RINGERS IV BOLUS
1000.0000 mL | Freq: Once | INTRAVENOUS | Status: AC
  Administered 2023-09-22: 1000 mL via INTRAVENOUS

## 2023-09-22 MED ORDER — ONDANSETRON HCL 4 MG PO TABS: 4.0000 mg | ORAL_TABLET | Freq: Four times a day (QID) | ORAL | 0 refills | Status: AC

## 2023-09-22 NOTE — ED Triage Notes (Signed)
Patient BIB [facility] from [facility] memory care Unit. EMS reported patient has been having projectile vomiting and diarrhea, which started at 3 pm today. Vital signs: BP 112/46, HR 74, Saturation 98% RA and CBG 173. Patient is a [employer] and form was brought in by EMS from the facility.

## 2023-09-22 NOTE — ED Notes (Signed)
Ptar called

## 2023-09-22 NOTE — Discharge Instructions (Signed)
You were seen in the emergency department for your vomiting and diarrhea.  Your workup showed no signs of severe dehydration and you tested negative for COVID, flu and RSV and you were negative for C. difficile.  We did send off additional infectious stool studies and if any of these come back positive you will receive a call.  You did not have any urinary symptoms in the ER so we did not check your urine but if you are continuing to feel sick you can have your urine checked by your primary doctor.  You can take Zofran as needed for nausea and you should make sure you are drinking plenty of fluids and having a bland diet.  You should have your primary doctor follow-up with your symptoms in the next few days.  You should return to the emergency department if having continued vomiting despite Zofran, develop severe abdominal pain, you become severely dehydrated or if you have any other new or concerning symptoms.

## 2023-09-22 NOTE — ED Provider Notes (Signed)
[facility] EMERG[facility] AT [facility] Provider Note   CSN: [id number] Arrival date & time: 09/22/23  1747     History  Chief Complaint  Patient presents with   Emesis   Diarrhea    Holly Salazar is a 88 y.o. female.  Patient is a 88 year old female with a past medical history of dementia, hypertension, arthritis presenting to the emergency department with vomiting and diarrhea.  Per EMS, the patient started to have "projectile vomiting" around 3 PM today with associated diarrhea.  The patient denies any abdominal pain to me.  She is holding a vomit bag but states that she no longer feels nauseous.  She denies feeling feverish.   Emesis Associated symptoms: diarrhea   Diarrhea Associated symptoms: vomiting        Home Medications Prior to Admission medications   Medication Sig Start Date End Date Taking? Authorizing Provider  acetaminophen (TYLENOL) 500 MG tablet Take 1,000 mg by mouth in the morning and at bedtime.   Yes [provider]  ascorbic acid (VITAMIN C) 500 MG tablet Take 500 mg by mouth daily.   Yes [provider]  Brexpiprazole (REXULTI) 0.5 MG TABS Take 1 tablet by mouth daily.   Yes [provider]  buPROPion (WELLBUTRIN) 75 MG tablet Take 75 mg by mouth at bedtime.   Yes [provider]  escitalopram (LEXAPRO) 20 MG tablet Take 20 mg by mouth daily.   Yes [provider]  lamoTRIgine (LAMICTAL) 25 MG tablet Take 25-50 mg by mouth See admin instructions. Take one tablet by mouth in the morning and then take 2 tablets by mouth in the evening for behaviors per [facility]   Yes [provider]  levothyroxine (SYNTHROID) 75 MCG tablet Take 75 mcg by mouth daily before breakfast.   Yes [provider]  LORazepam (ATIVAN) 0.5 MG tablet Take 0.5 mg by mouth 2 (two) times daily.   Yes [provider]  ondansetron (ZOFRAN) 4 MG tablet Take 1 tablet (4 mg total) by mouth every 6 (six)  hours. 09/22/23  Yes Theresia Lo, Cyniah Gossard K, DO  polycarbophil (FIBERCON) 625 MG tablet Take 625 mg by mouth in the morning and at bedtime.   Yes [provider]  Calcium Carb-Cholecalciferol (CALCIUM 500+D) 500-200 MG-UNIT TABS Take 1 tablet by mouth in the morning and at bedtime.   Yes [provider]  ferrous sulfate 325 (65 FE) MG tablet Take 325 mg by mouth daily with breakfast.   Yes [provider]  mirtazapine (REMERON) 7.5 MG tablet Take 7.5 mg by mouth at bedtime.   Yes [provider]  Multiple Vitamin (MULTIVITAMIN WITH MINERALS) TABS tablet Take 1 tablet by mouth daily.   Yes [provider]  pantoprazole (PROTONIX) 40 MG tablet Take 1 tablet (40 mg total) by mouth daily. 10/29/19 09/22/23 Yes Leatha Gilding, MD      Allergies    Patient has no known allergies.    Review of Systems   Review of Systems  Gastrointestinal:  Positive for diarrhea and vomiting.    Physical Exam Updated Vital Signs BP 128/75   Pulse 94   Temp 98.2 F (36.8 C) (Oral)   Resp 17   SpO2 93%  Physical Exam Vitals and nursing note reviewed.  Constitutional:      General: She is not in acute distress.    Appearance: Normal appearance.     Comments: Holding bag with vomit  HENT:     Head:  Normocephalic and atraumatic.     Nose: Nose normal.     Mouth/Throat:     Mouth: Mucous membranes are dry.     Pharynx: Oropharynx is clear.  Eyes:     Extraocular Movements: Extraocular movements intact.     Conjunctiva/sclera: Conjunctivae normal.  Cardiovascular:     Rate and Rhythm: Normal rate and regular rhythm.     Heart sounds: Normal heart sounds.  Pulmonary:     Effort: Pulmonary effort is normal.     Breath sounds: Normal breath sounds.  Abdominal:     General: Abdomen is flat.     Palpations: Abdomen is soft.     Tenderness: There is no abdominal tenderness.  Musculoskeletal:        General: Normal range of motion.     Cervical back: Normal  range of motion.  Skin:    General: Skin is warm and dry.  Neurological:     General: No focal deficit present.     Mental Status: She is alert. Mental status is at baseline.  Psychiatric:        Mood and Affect: Mood normal.        Behavior: Behavior normal.     ED Results / Procedures / Treatments   Labs (all labs ordered are listed, but only abnormal results are displayed) Labs Reviewed  COMPREHENSIVE METABOLIC PANEL - Abnormal; Notable for the following components:      Result Value   Glucose, Bld 165 (*)    BUN 38 (*)    Creatinine, Ser 1.02 (*)    GFR, Estimated 54 (*)    All other components within normal limits  CBC WITH DIFFERENTIAL/PLATELET - Abnormal; Notable for the following components:   RBC 3.66 (*)    Hemoglobin 11.5 (*)    HCT 35.1 (*)    RDW 15.6 (*)    Neutro Abs 9.0 (*)    Lymphs Abs 0.2 (*)    All other components within normal limits  RESP PANEL BY RT-PCR (RSV, FLU A&B, COVID)  RVPGX2  C DIFFICILE QUICK SCREEN W PCR REFLEX    GASTROINTESTINAL PANEL BY PCR, STOOL (REPLACES STOOL CULTURE)  LIPASE, BLOOD  URINALYSIS, ROUTINE W REFLEX MICROSCOPIC    EKG None  Radiology No results found.  Procedures Procedures    Medications Ordered in ED Medications  lactated ringers bolus 1,000 mL (1,000 mLs Intravenous New Bag/Given 09/22/23 1934)  ondansetron (ZOFRAN) injection 4 mg (4 mg Intravenous Given 09/22/23 1934)    ED Course/ Medical Decision Making/ A&P Clinical Course as of 09/22/23 2322  Wed Sep 22, 2023  2035 Mild increased Cr, otherwise labs within normal range. [VK]  2303 Unable to collect urine here, patient denies any urinary symptoms. She has had no vomiting in the ED. She is stable for discharge back to her facility. [VK]    Clinical Course User Index [VK] Rexford Maus, DO                                 Medical Decision Making This patient presents to the ED with chief complaint(s) of N/V/D with pertinent past medical  history of dementia, hypertension, arthritis which further complicates the presenting complaint. The complaint involves an extensive differential diagnosis and also carries with it a high risk of complications and morbidity.    The differential diagnosis includes gastroenteritis, gastritis, GERD, dehydration, electrolyte abnormality, hepatitis, pancreatitis, viral syndrome, infectious diarrhea,  UTI  Additional history obtained: Additional history obtained from EMS  Records reviewed Nursing Home Documents  ED Course and Reassessment: On patient's arrival she is hemodynamically stable in no acute distress, is dry appearing but otherwise has no acute complaints.  Patient will workup performed to evaluate for dehydration or etiology of her symptoms and will be closely reassessed.  Independent labs interpretation:  The following labs were independently interpreted: mild increased Cr from baseline, otherwise within normal range  Independent visualization of imaging: - N/A  Consultation: - Consulted or discussed management/test interpretation w/ external professional: N/A  Consideration for admission or further workup: Patient has no emergent conditions requiring admission or further work-up at this time and is stable for discharge home with primary care follow-up  Social Determinants of health: N/A    Amount and/or Complexity of Data Reviewed Labs: ordered.  Risk Prescription drug management.          Final Clinical Impression(s) / ED Diagnoses Final diagnoses:  Nausea vomiting and diarrhea  Gastroenteritis    Rx / DC Orders ED Discharge Orders          Ordered    ondansetron (ZOFRAN) 4 MG tablet  Every 6 hours        09/22/23 2311              Rexford Maus, DO 09/22/23 2322

## 2023-09-23 LAB — GASTROINTESTINAL PANEL BY PCR, STOOL (REPLACES STOOL CULTURE)

## 2024-02-21 ENCOUNTER — Emergency Department (HOSPITAL_COMMUNITY)

## 2024-02-21 ENCOUNTER — Emergency Department (HOSPITAL_COMMUNITY)
Admission: EM | Admit: 2024-02-21 | Discharge: 2024-02-21 | Disposition: A | Discharge status: Home / Self Care | Source: Skilled Nursing Facility | Attending: Emergency Medicine | Admitting: Emergency Medicine

## 2024-02-21 ENCOUNTER — Other Ambulatory Visit: Payer: Self-pay

## 2024-02-21 DIAGNOSIS — F0390 Unspecified dementia without behavioral disturbance: Secondary | ICD-10-CM | POA: Diagnosis not present

## 2024-02-21 DIAGNOSIS — F0150 Vascular dementia without behavioral disturbance: Secondary | ICD-10-CM

## 2024-02-21 DIAGNOSIS — R451 Restlessness and agitation: Secondary | ICD-10-CM | POA: Diagnosis not present

## 2024-02-21 DIAGNOSIS — R4182 Altered mental status, unspecified: Secondary | ICD-10-CM | POA: Insufficient documentation

## 2024-02-21 LAB — URINALYSIS, ROUTINE W REFLEX MICROSCOPIC
Bilirubin Urine: NEGATIVE
Glucose, UA: NEGATIVE mg/dL
Hgb urine dipstick: NEGATIVE
Ketones, ur: 5 mg/dL — AB
Leukocytes,Ua: NEGATIVE
Nitrite: NEGATIVE
Protein, ur: NEGATIVE mg/dL
Specific Gravity, Urine: 1.025 (ref 1.005–1.030)
pH: 5 (ref 5.0–8.0)

## 2024-02-21 LAB — CBC WITH DIFFERENTIAL/PLATELET
Abs Immature Granulocytes: 0.02 10*3/uL (ref 0.00–0.07)
Basophils Absolute: 0 10*3/uL (ref 0.0–0.1)
Basophils Relative: 0 %
Eosinophils Absolute: 0 10*3/uL (ref 0.0–0.5)
Eosinophils Relative: 1 %
HCT: 36.2 % (ref 36.0–46.0)
Hemoglobin: 11.5 g/dL — ABNORMAL LOW (ref 12.0–15.0)
Immature Granulocytes: 0 %
Lymphocytes Relative: 38 %
Lymphs Abs: 1.7 10*3/uL (ref 0.7–4.0)
MCH: 29.8 pg (ref 26.0–34.0)
MCHC: 31.8 g/dL (ref 30.0–36.0)
MCV: 93.8 fL (ref 80.0–100.0)
Monocytes Absolute: 0.5 10*3/uL (ref 0.1–1.0)
Monocytes Relative: 10 %
Neutro Abs: 2.4 10*3/uL (ref 1.7–7.7)
Neutrophils Relative %: 51 %
Platelets: 226 10*3/uL (ref 150–400)
RBC: 3.86 MIL/uL — ABNORMAL LOW (ref 3.87–5.11)
RDW: 15.7 % — ABNORMAL HIGH (ref 11.5–15.5)
WBC: 4.6 10*3/uL (ref 4.0–10.5)
nRBC: 0 % (ref 0.0–0.2)

## 2024-02-21 LAB — BASIC METABOLIC PANEL WITH GFR
Anion gap: 12 (ref 5–15)
BUN: 32 mg/dL — ABNORMAL HIGH (ref 8–23)
CO2: 23 mmol/L (ref 22–32)
Calcium: 9.2 mg/dL (ref 8.9–10.3)
Chloride: 104 mmol/L (ref 98–111)
Creatinine, Ser: 0.7 mg/dL (ref 0.44–1.00)
GFR, Estimated: >60 mL/min (ref >60)
Glucose, Bld: 109 mg/dL — ABNORMAL HIGH (ref 70–99)
Potassium: 4 mmol/L (ref 3.5–5.1)
Sodium: 139 mmol/L (ref 135–145)

## 2024-02-21 MED ORDER — LORAZEPAM 2 MG/ML IJ SOLN
1.0000 mg | Freq: Once | INTRAMUSCULAR | Status: AC
  Administered 2024-02-21: 1 mg via INTRAMUSCULAR
  Filled 2024-02-21: qty 1

## 2024-02-21 MED ORDER — LORAZEPAM 2 MG/ML IJ SOLN
2.0000 mg | Freq: Once | INTRAMUSCULAR | Status: AC
  Administered 2024-02-21: 2 mg via INTRAMUSCULAR
  Filled 2024-02-21: qty 1

## 2024-02-21 MED ORDER — ZIPRASIDONE MESYLATE 20 MG IM SOLR
10.0000 mg | Freq: Once | INTRAMUSCULAR | Status: AC
  Administered 2024-02-21: 10 mg via INTRAMUSCULAR
  Filled 2024-02-21: qty 20

## 2024-02-21 NOTE — Discharge Instructions (Signed)
 Increase her Ativan so she is taking 1 mg 3 times a day if needed.  Follow-up with your family doctor next week

## 2024-02-21 NOTE — ED Provider Notes (Signed)
 [place] EMERGENCY DEPARTMENT AT [facility] Provider Note   CSN: [id number] Arrival date & time: 02/21/24  1626     Patient presents with: Altered Mental Status   Holly Salazar is a 88 y.o. female.  {Add pertinent medical, surgical, social history, OB history to YEP:67052} Patient has a history of dementia.  She is more agitated today and was sent over here for evaluation and possible urinary tract infection.  The history is provided by the patient. No language interpreter was used.  Altered Mental Status Presenting symptoms: behavior changes        Prior to Admission medications   Medication Sig Start Date End Date Taking? Authorizing Provider  acetaminophen  (TYLENOL ) 500 MG tablet Take 1,000 mg by mouth in the morning and at bedtime.    [provider]  ascorbic acid  (VITAMIN C ) 500 MG tablet Take 500 mg by mouth daily.    [provider]  Brexpiprazole (REXULTI) 0.5 MG TABS Take 1 tablet by mouth daily.    [provider]  buPROPion (WELLBUTRIN) 75 MG tablet Take 75 mg by mouth at bedtime.    [provider]  Calcium Carb-Cholecalciferol (CALCIUM 500+D) 500-200 MG-UNIT TABS Take 1 tablet by mouth in the morning and at bedtime.    [provider]  escitalopram (LEXAPRO) 20 MG tablet Take 20 mg by mouth daily.    [provider]  ferrous sulfate 325 (65 FE) MG tablet Take 325 mg by mouth daily with breakfast.    [provider]  lamoTRIgine (LAMICTAL) 25 MG tablet Take 25-50 mg by mouth See admin instructions. Take one tablet by mouth in the morning and then take 2 tablets by mouth in the evening for behaviors per [facility]    [provider]  levothyroxine (SYNTHROID) 75 MCG tablet Take 75 mcg by mouth daily before breakfast.    [provider]  LORazepam (ATIVAN) 0.5 MG tablet Take 0.5 mg by mouth 2 (two) times daily.    [provider]  mirtazapine (REMERON) 7.5 MG tablet Take 7.5  mg by mouth at bedtime.    [provider]  Multiple Vitamin (MULTIVITAMIN WITH MINERALS) TABS tablet Take 1 tablet by mouth daily.    [provider]  ondansetron  (ZOFRAN ) 4 MG tablet Take 1 tablet (4 mg total) by mouth every 6 (six) hours. 09/22/23   Kingsley, Victoria K, DO  pantoprazole  (PROTONIX ) 40 MG tablet Take 1 tablet (40 mg total) by mouth daily. 10/29/19 09/22/23  Gherghe, Costin M, MD  polycarbophil (FIBERCON) 625 MG tablet Take 625 mg by mouth in the morning and at bedtime.    [provider]    Allergies: Patient has no known allergies.    Review of Systems  Updated Vital Signs BP 126/63   Pulse 83   Temp 97.7 F (36.5 C)   Resp (!) 22   SpO2 96%   Physical Exam  (all labs ordered are listed, but only abnormal results are displayed) Labs Reviewed  CBC WITH DIFFERENTIAL/PLATELET - Abnormal; Notable for the following components:      Result Value   RBC 3.86 (*)    Hemoglobin 11.5 (*)    RDW 15.7 (*)    All other components within normal limits  BASIC METABOLIC PANEL WITH GFR - Abnormal; Notable for the following components:   Glucose, Bld 109 (*)    BUN 32 (*)    All other components within normal limits  URINALYSIS, ROUTINE W REFLEX MICROSCOPIC -  Abnormal; Notable for the following components:   Ketones, ur 5 (*)    All other components within normal limits  URINE CULTURE    EKG: None  Radiology: [facility] Chest Port 1 View Result Date: 02/21/2024 CLINICAL DATA:  Short of breath EXAM: PORTABLE CHEST 1 VIEW COMPARISON:  10/30/2022 FINDINGS: Normal mediastinum and cardiac silhouette. Normal pulmonary vasculature. No evidence of effusion, infiltrate, or pneumothorax. No acute bony abnormality. IMPRESSION: No acute cardiopulmonary process. Electronically Signed   By: Jackquline Boxer M.D.   On: 02/21/2024 18:35    {Document cardiac monitor, telemetry assessment procedure when appropriate:32947} Procedures   Medications Ordered in the ED   ziprasidone (GEODON) injection 10 mg (10 mg Intramuscular Given 02/21/24 1706)  LORazepam (ATIVAN) injection 1 mg (1 mg Intramuscular Given 02/21/24 1842)      {Click here for ABCD2, HEART and other calculators REFRESH Note before signing:1}                              Medical Decision Making Amount and/or Complexity of Data Reviewed Labs: ordered. Radiology: ordered.  Risk Prescription drug management.   Patient with worsening dementia.  She will increase her Ativan and follow-up with her family doctor  {Document critical care time when appropriate  Document review of labs and clinical decision tools ie CHADS2VASC2, etc  Document your independent review of radiology images and any outside records  Document your discussion with family members, caretakers and with consultants  Document social determinants of health affecting pt's care  Document your decision making why or why not admission, treatments were needed:32947:::1}   Final diagnoses:  Vascular dementia without behavioral disturbance, psychotic disturbance, mood disturbance, or anxiety, unspecified dementia severity [facility])    ED Discharge Orders     None

## 2024-02-21 NOTE — ED Notes (Signed)
 Able to arranged transport with ptar

## 2024-02-21 NOTE — ED Notes (Signed)
 Able to spoke and gave report to [facility], and made them aware that this pt is returning to their facility.

## 2024-02-21 NOTE — ED Triage Notes (Signed)
 Per ems pt arrives from [facility], was found in room yelling/screaming that she wants to die, that he needs her blood (brother in law), wants her to pee (admitted to me it does burn when she urinates). Was given one ativan this morning, and one this afternoon. Pt constantly yelling still that she wants to go home

## 2024-02-22 LAB — URINE CULTURE: Culture: NO GROWTH

## 2024-08-29 ENCOUNTER — Emergency Department (HOSPITAL_COMMUNITY)
Admission: EM | Admit: 2024-08-29 | Source: Skilled Nursing Facility | Attending: Emergency Medicine | Admitting: Emergency Medicine

## 2024-08-29 DIAGNOSIS — R45851 Suicidal ideations: Secondary | ICD-10-CM

## 2024-08-29 DIAGNOSIS — R4189 Other symptoms and signs involving cognitive functions and awareness: Secondary | ICD-10-CM | POA: Insufficient documentation

## 2024-08-29 DIAGNOSIS — F03918 Unspecified dementia, unspecified severity, with other behavioral disturbance: Secondary | ICD-10-CM | POA: Diagnosis present

## 2024-08-29 LAB — COMPREHENSIVE METABOLIC PANEL WITH GFR
ALT: 10 U/L (ref 0–44)
AST: 20 U/L (ref 15–41)
Albumin: 4 g/dL (ref 3.5–5.0)
Alkaline Phosphatase: 85 U/L (ref 38–126)
Anion gap: 11 (ref 5–15)
BUN: 35 mg/dL — ABNORMAL HIGH (ref 8–23)
CO2: 25 mmol/L (ref 22–32)
Calcium: 9.7 mg/dL (ref 8.9–10.3)
Chloride: 105 mmol/L (ref 98–111)
Creatinine, Ser: 1.19 mg/dL — ABNORMAL HIGH (ref 0.44–1.00)
GFR, Estimated: 44 mL/min — ABNORMAL LOW
Glucose, Bld: 124 mg/dL — ABNORMAL HIGH (ref 70–99)
Potassium: 3.9 mmol/L (ref 3.5–5.1)
Sodium: 141 mmol/L (ref 135–145)
Total Bilirubin: 0.2 mg/dL (ref 0.0–1.2)
Total Protein: 6.9 g/dL (ref 6.5–8.1)

## 2024-08-29 LAB — CBC WITH DIFFERENTIAL/PLATELET
Abs Immature Granulocytes: 0.01 K/uL (ref 0.00–0.07)
Basophils Absolute: 0 K/uL (ref 0.0–0.1)
Basophils Relative: 0 %
Eosinophils Absolute: 0 K/uL (ref 0.0–0.5)
Eosinophils Relative: 0 %
HCT: 36.6 % (ref 36.0–46.0)
Hemoglobin: 12 g/dL (ref 12.0–15.0)
Immature Granulocytes: 0 %
Lymphocytes Relative: 36 %
Lymphs Abs: 1.8 K/uL (ref 0.7–4.0)
MCH: 31.5 pg (ref 26.0–34.0)
MCHC: 32.8 g/dL (ref 30.0–36.0)
MCV: 96.1 fL (ref 80.0–100.0)
Monocytes Absolute: 0.4 K/uL (ref 0.1–1.0)
Monocytes Relative: 7 %
Neutro Abs: 2.8 K/uL (ref 1.7–7.7)
Neutrophils Relative %: 57 %
Platelets: 265 K/uL (ref 150–400)
RBC: 3.81 MIL/uL — ABNORMAL LOW (ref 3.87–5.11)
RDW: 14 % (ref 11.5–15.5)
Smear Review: NORMAL
WBC: 5 K/uL (ref 4.0–10.5)
nRBC: 0 % (ref 0.0–0.2)

## 2024-08-29 LAB — MAGNESIUM: Magnesium: 2.1 mg/dL (ref 1.7–2.4)

## 2024-08-29 LAB — ETHANOL: Alcohol, Ethyl (B): <15 mg/dL

## 2024-08-29 MED ORDER — LORAZEPAM 0.5 MG PO TABS
0.5000 mg | ORAL_TABLET | Freq: Two times a day (BID) | ORAL | Status: DC
  Filled 2024-08-29 (×2): qty 1

## 2024-08-29 MED ORDER — LEVOTHYROXINE SODIUM 50 MCG PO TABS
75.0000 ug | ORAL_TABLET | Freq: Every day | ORAL | Status: AC
  Administered 2024-08-31 – 2024-09-29 (×27): 75 ug via ORAL
  Filled 2024-08-29 (×27): qty 1

## 2024-08-29 MED ORDER — ESCITALOPRAM OXALATE 10 MG PO TABS
20.0000 mg | ORAL_TABLET | Freq: Every day | ORAL | Status: DC
  Filled 2024-08-29: qty 2

## 2024-08-29 MED ORDER — LAMOTRIGINE 25 MG PO TABS
25.0000 mg | ORAL_TABLET | Freq: Every day | ORAL | Status: AC
  Administered 2024-08-31 – 2024-09-29 (×25): 25 mg via ORAL
  Filled 2024-08-29 (×29): qty 1

## 2024-08-29 MED ORDER — MIRTAZAPINE 7.5 MG PO TABS
7.5000 mg | ORAL_TABLET | Freq: Every day | ORAL | Status: DC
  Filled 2024-08-29: qty 1

## 2024-08-29 MED ORDER — BUPROPION HCL 75 MG PO TABS
75.0000 mg | ORAL_TABLET | Freq: Every day | ORAL | Status: DC
  Filled 2024-08-29: qty 1

## 2024-08-29 MED ORDER — PANTOPRAZOLE SODIUM 40 MG PO TBEC
40.0000 mg | DELAYED_RELEASE_TABLET | Freq: Every day | ORAL | Status: AC
  Administered 2024-08-31 – 2024-09-29 (×27): 40 mg via ORAL
  Filled 2024-08-29 (×28): qty 1

## 2024-08-29 MED ORDER — ACETAMINOPHEN 500 MG PO TABS
500.0000 mg | ORAL_TABLET | Freq: Four times a day (QID) | ORAL | Status: AC
  Administered 2024-08-30 – 2024-09-29 (×80): 500 mg via ORAL
  Filled 2024-08-29 (×86): qty 1

## 2024-08-29 MED ORDER — LAMOTRIGINE 25 MG PO TABS
50.0000 mg | ORAL_TABLET | Freq: Every day | ORAL | Status: AC
  Administered 2024-08-30 – 2024-09-29 (×29): 50 mg via ORAL
  Filled 2024-08-29 (×31): qty 2

## 2024-08-29 NOTE — ED Notes (Signed)
 Attempted EKG. Patient began ripping cords off and questioning when she could go home.

## 2024-08-29 NOTE — ED Triage Notes (Signed)
 Pt BIB ems from [facility], suicidal ideation and more altered than baseline. Tried to grab gun from officer, stating she wants to die. 20g L Forearm. Given 2.5mg  haldol , 2.5mg  versed , and lorazepam  at the facility. VS stable

## 2024-08-29 NOTE — ED Provider Notes (Signed)
 " [place] EMERGENCY DEPARTMENT AT [facility] Provider Note   CSN: [id number] Arrival date & time: 08/29/24  8161     Patient presents with: Suicidal, Altered Mental Status, and IVC   Holly Salazar is a 89 y.o. female.   HPI Patient presents for suicidal ideation.  Medical history includes HTN, osteoporosis.  It is unclear if she has a documented history of dementia, however, she is prescribed rivastigmine.  She arrives from [facility] nursing facility.  Patient reportedly had onset of agitation and threats of suicide this afternoon.  She was given a dose of Ativan  at the facility.  Police were called to the scene and she reportedly attempted to grab one of their pistols.  EMS gave 2.5 mg of Haldol  intramuscularly and 2.5 mg of Versed  intravenously.  She has had improvement in her agitation.  She continues to endorse suicidal ideation.    Prior to Admission medications  Medication Sig Start Date End Date Taking? Authorizing Provider  acetaminophen  (TYLENOL ) 500 MG tablet Take 1,000 mg by mouth in the morning and at bedtime.    [provider]  ascorbic acid  (VITAMIN C ) 500 MG tablet Take 500 mg by mouth daily.    [provider]  Brexpiprazole (REXULTI) 0.5 MG TABS Take 1 tablet by mouth daily.    [provider]  buPROPion  (WELLBUTRIN ) 75 MG tablet Take 75 mg by mouth at bedtime.    [provider]  Calcium Carb-Cholecalciferol (CALCIUM 500+D) 500-200 MG-UNIT TABS Take 1 tablet by mouth in the morning and at bedtime.    [provider]  escitalopram  (LEXAPRO ) 20 MG tablet Take 20 mg by mouth daily.    [provider]  ferrous sulfate 325 (65 FE) MG tablet Take 325 mg by mouth daily with breakfast.    [provider]  lamoTRIgine  (LAMICTAL ) 25 MG tablet Take 25-50 mg by mouth See admin instructions. Take one tablet by mouth in the morning and then take 2 tablets by mouth in the evening for behaviors per [facility]     [provider]  levothyroxine  (SYNTHROID ) 75 MCG tablet Take 75 mcg by mouth daily before breakfast.    [provider]  LORazepam  (ATIVAN ) 0.5 MG tablet Take 0.5 mg by mouth 2 (two) times daily.    [provider]  mirtazapine  (REMERON ) 7.5 MG tablet Take 7.5 mg by mouth at bedtime.    [provider]  Multiple Vitamin (MULTIVITAMIN WITH MINERALS) TABS tablet Take 1 tablet by mouth daily.    [provider]  ondansetron  (ZOFRAN ) 4 MG tablet Take 1 tablet (4 mg total) by mouth every 6 (six) hours. 09/22/23   Kingsley, Victoria K, DO  pantoprazole  (PROTONIX ) 40 MG tablet Take 1 tablet (40 mg total) by mouth daily. 10/29/19 09/22/23  Gherghe, Costin M, MD  polycarbophil (FIBERCON) 625 MG tablet Take 625 mg by mouth in the morning and at bedtime.    [provider]    Allergies: Patient has no known allergies.    Review of Systems  Unable to perform ROS: Dementia  Psychiatric/Behavioral:  Positive for suicidal ideas.     Updated Vital Signs BP 130/66 (BP Location: Left Arm)   Pulse 77   Temp 98.6 F (37 C) (Axillary)   Resp 20   SpO2 97%   Physical Exam Vitals and nursing note reviewed.  Constitutional:      General: She is not in acute distress.    Appearance: She is well-developed. She is  not ill-appearing, toxic-appearing or diaphoretic.  HENT:     Head: Normocephalic and atraumatic.     Right Ear: External ear normal.     Left Ear: External ear normal.     Nose: Nose normal.     Mouth/Throat:     Mouth: Mucous membranes are moist.  Eyes:     Extraocular Movements: Extraocular movements intact.     Conjunctiva/sclera: Conjunctivae normal.  Cardiovascular:     Rate and Rhythm: Normal rate and regular rhythm.  Pulmonary:     Effort: Pulmonary effort is normal. No respiratory distress.     Breath sounds: Normal breath sounds.  Abdominal:     General: There is no distension.     Palpations: Abdomen is soft.     Tenderness:  There is no abdominal tenderness.  Musculoskeletal:        General: No swelling, tenderness or deformity.     Cervical back: Normal range of motion and neck supple.     Right lower leg: Edema present.     Left lower leg: Edema present.  Skin:    General: Skin is warm and dry.     Coloration: Skin is not jaundiced or pale.  Neurological:     General: No focal deficit present.     Mental Status: She is alert. She is disoriented.  Psychiatric:        Mood and Affect: Mood is anxious. Affect is labile.        Behavior: Behavior is uncooperative and withdrawn.        Thought Content: Thought content includes suicidal ideation.     (all labs ordered are listed, but only abnormal results are displayed) Labs Reviewed  COMPREHENSIVE METABOLIC PANEL WITH GFR - Abnormal; Notable for the following components:      Result Value   Glucose, Bld 124 (*)    BUN 35 (*)    Creatinine, Ser 1.19 (*)    GFR, Estimated 44 (*)    All other components within normal limits  CBC WITH DIFFERENTIAL/PLATELET - Abnormal; Notable for the following components:   RBC 3.81 (*)    All other components within normal limits  ETHANOL  MAGNESIUM  URINE DRUG SCREEN  URINALYSIS, ROUTINE W REFLEX MICROSCOPIC    EKG: EKG Interpretation Date/Time:  Tuesday August 29 2024 22:38:32 EST Ventricular Rate:  75 PR Interval:  195 QRS Duration:  88 QT Interval:  395 QTC Calculation: 442 R Axis:   68  Text Interpretation: Sinus rhythm Nonspecific T abnormalities, lateral leads Confirmed by Melvenia Motto [phone]) on 08/29/2024 10:44:40 PM  Radiology: No results found.   Procedures   Medications Ordered in the ED  acetaminophen  (TYLENOL ) tablet 500 mg (has no administration in time range)  buPROPion  (WELLBUTRIN ) tablet 75 mg (has no administration in time range)  escitalopram  (LEXAPRO ) tablet 20 mg (has no administration in time range)  lamoTRIgine  (LAMICTAL ) tablet 25-50 mg (has no administration in time range)   levothyroxine  (SYNTHROID ) tablet 75 mcg (has no administration in time range)  LORazepam  (ATIVAN ) tablet 0.5 mg (has no administration in time range)  mirtazapine  (REMERON ) tablet 7.5 mg (has no administration in time range)  pantoprazole  (PROTONIX ) EC tablet 40 mg (has no administration in time range)                                    Medical Decision Making Amount and/or Complexity of Data Reviewed  Labs: ordered.  Patient presenting for agitation and suicidal ideation.  She arrives under IVC from [facility] nursing facility.  It is unclear if she has a documented history of dementia but she does get rivastigmine patches.  Her agitation reportedly began today.  She was reportedly having verbal outbursts and throwing her close.  She reportedly asked a emergency planning/management officer for his gun so she could shoot herself.  She had repetitive suicidal statements.  She did receive Haldol  and Versed  prior to arrival.  On arrival, she is awake and alert.  She is disoriented and thinks that she is in [place] .  She does not provide any history.  Medical clearance workup was initiated.  First exam paperwork completed.  Patient's lab work showed normal hemoglobin, no leukocytosis.  Creatinine slightly elevated from baseline.  Electrolytes are normal.  On reassessment, patient only mildly agitated.  TTS and TOC were consulted.  Home medications ordered.     Final diagnoses:  Suicidal ideation    ED Discharge Orders     None          Melvenia Motto, MD 08/29/24 2337  "

## 2024-08-30 MED ORDER — MIRTAZAPINE 30 MG PO TABS
30.0000 mg | ORAL_TABLET | Freq: Every day | ORAL | Status: DC
  Administered 2024-08-30 – 2024-09-03 (×4): 30 mg via ORAL
  Filled 2024-08-30 (×5): qty 1

## 2024-08-30 MED ORDER — LORAZEPAM 0.5 MG PO TABS
0.5000 mg | ORAL_TABLET | Freq: Three times a day (TID) | ORAL | Status: DC
  Administered 2024-08-30 – 2024-09-03 (×9): 0.5 mg via ORAL
  Filled 2024-08-30 (×11): qty 1

## 2024-08-30 MED ORDER — OLANZAPINE 5 MG PO TBDP: 5.0000 mg | ORAL_TABLET | Freq: Once | ORAL | Status: DC

## 2024-08-30 MED ORDER — MIDAZOLAM HCL (PF) 2 MG/2ML IJ SOLN
1.0000 mg | Freq: Once | INTRAMUSCULAR | Status: AC
  Administered 2024-08-30: 1 mg via INTRAMUSCULAR
  Filled 2024-08-30: qty 2

## 2024-08-30 MED ORDER — ZIPRASIDONE MESYLATE 20 MG IM SOLR
20.0000 mg | Freq: Once | INTRAMUSCULAR | Status: AC
  Administered 2024-08-30: 20 mg via INTRAMUSCULAR
  Filled 2024-08-30: qty 20

## 2024-08-30 MED ORDER — STERILE WATER FOR INJECTION IJ SOLN
INTRAMUSCULAR | Status: AC
  Administered 2024-08-30: 1 mL
  Filled 2024-08-30: qty 10

## 2024-08-30 MED ORDER — MIDAZOLAM HCL (PF) 2 MG/2ML IJ SOLN
1.0000 mg | Freq: Once | INTRAMUSCULAR | Status: DC
  Filled 2024-08-30: qty 2

## 2024-08-30 MED ORDER — HALOPERIDOL LACTATE 5 MG/ML IJ SOLN
2.0000 mg | Freq: Once | INTRAMUSCULAR | Status: AC
  Administered 2024-08-30: 2 mg via INTRAMUSCULAR
  Filled 2024-08-30: qty 1

## 2024-08-30 MED ORDER — ESCITALOPRAM OXALATE 10 MG PO TABS
10.0000 mg | ORAL_TABLET | Freq: Every day | ORAL | Status: AC
  Administered 2024-08-31 – 2024-09-29 (×26): 10 mg via ORAL
  Filled 2024-08-30 (×28): qty 1

## 2024-08-30 NOTE — ED Notes (Signed)
 Patient is resting comfortably.

## 2024-08-30 NOTE — ED Notes (Signed)
 Pt is resting.

## 2024-08-30 NOTE — ED Notes (Signed)
 Pt is not sleep and still disrespectful to staff

## 2024-08-30 NOTE — Progress Notes (Signed)
 This provider attempted to assess patient; however, patient was unable to be awakened to participate in the psych evaluation. China I., sitter to notify provider when patient is awake and cooperative to complete evaluation.

## 2024-08-30 NOTE — Progress Notes (Signed)
 Pt is screaming and cursing at staff sitter tried to feed pt pt refused

## 2024-08-30 NOTE — ED Notes (Signed)
 Pt in brief in hallway. Techs tried bed pan earlier to no avail. Pt keeps stating she wants to use the bathroom but is unsteady and aggressive when out of restraints.

## 2024-08-30 NOTE — ED Notes (Signed)
 Pt is aggrivated

## 2024-08-30 NOTE — ED Notes (Signed)
 Pt  is upset and calling names

## 2024-08-30 NOTE — ED Notes (Signed)
 Pt is yelling at staff

## 2024-08-30 NOTE — Progress Notes (Addendum)
 Per chart review, pt will be evaluated by TTS.   Pt was brought in from [facility] (Assisted Living). Pt has Traditional Medicare (A/B) without [employer] Waiver and would require a 3 midnight inpatient stay to be placed into rehab. Pt is unable to be placed into SNF from ED.   If/when pt is psychiatrically cleared, RN will need to call facility to give report. If FL2 is needed for return, please place consult.   Private duty care and senior living advisor (for memory care) added to AVS. ICM signing off.

## 2024-08-30 NOTE — Progress Notes (Signed)
 This provider attempted to assess patient for the second time today; however, patient was screaming, I hate God. Patient uncooperative and unable to participate in psych evaluation. Patient currently in restraints. UA pending collection. Would like to rule out UTI. Will have Chesley BROCKS., [facility] contact patient's legal guardian Powell Irving or [facility] to obtain history and baseline.

## 2024-08-30 NOTE — ED Provider Notes (Signed)
" °  [place] EMERGENCY DEPARTMENT AT [facility] Emergency Medicine Observation Re-evaluation Note  Clair Bardwell is a 89 y.o. female, seen on rounds today.  Pt initially presented on 08/29/24 at 1838 to the ED for complaints of agitation, making suicidal threats, sent from SNF.  PMHx: HTN, osteoporosis  Currently, the patient is resting quietly in bed.  Physical Exam  BP (!) 167/106 (BP Location: Left Leg)   Pulse 94   Temp 97.8 F (36.6 C) (Axillary)   Resp 19   SpO2 99%  Physical Exam General: NAD Lungs: Normal effort Psych: Currently calm  ED Course / MDM  EKG:EKG Interpretation Date/Time:  Tuesday August 29 2024 22:38:32 EST Ventricular Rate:  75 PR Interval:  195 QRS Duration:  88 QT Interval:  395 QTC Calculation: 442 R Axis:   68  Text Interpretation: Sinus rhythm Nonspecific T abnormalities, lateral leads Confirmed by Melvenia Motto [phone]) on 08/29/2024 10:44:40 PM  I have reviewed the labs performed to date as well as medications administered while in observation.  Recent changes in the last 24 hours include psychiatry attempted to evaluate patient, however patient was agitated and was unable to participate in assessment. Home medications: Reordered by prior team Diet: Reordered by prior team  Plan  Current plan is for awaiting psychiatric recommendations.  Around the day, patient became agitated, not verbally redirectable, continuously feeling that she needs to urinate, however declining to urinate when offered bedpan, thus ODT Zyprexa  ordered for agitation.   Rogelia Jerilynn RAMAN, MD 08/30/24 1230  "

## 2024-08-30 NOTE — Progress Notes (Signed)
 Medication transcript sent from [facility] assisted living facility  [facility] psychotropic medications as followed -Escitalopram  10 mg, 1 tablet by mouth in the morning for agitation -Lamictal  25 mg, 2 tablets by mouth in the evening for behaviors -Lamictal  25 mg in the morning for behaviors -Lorazepam  1 mg, give 1 tablet by mouth 3 times a day for anxiety and agitation, hold for sedation -Remeron  30 mg, give 1 tablet by mouth at bedtime for depression -Rivastigmine transdermal patch 24 hour 9.5 mg/24HR, apply 1 patch transdermally one time a day (for dementia behaviors)   Current ED psychotropics ordered -Lexapro  20 mg daily -Wellbutrin  75 mg daily -Lamictal  25 mg daily -Lamictal  50 mg at bedtime -Ativan  0.5 mg twice daily -Remeron  7.5 mg at bedtime   Medication adjustments made on 08/30/2024 Will make medication adjustments to reflect current psychotropic regimen as prescribed at [facility], consider adding antipsychotic medication Seroquel  for agitation once psychiatric evaluation has been completed.  -Will DC Wellbutrin  as patient is not currently prescribed medication  -Will adjust Ativan  to 0.5 mg 3 times daily to prevent benzodiazepine withdrawal due to patient taking 1 mg 3 times daily at [facility] facility, will need to monitor for confusion and fall risks as benzodiazepines are not recommended in the elderly population for those reasons  -Will decrease Lexapro  to 10 mg, current prescribed dose  -Continue Lamictal  25 mg daily  -Continue Lamictal  50 mg at bedtime  -Increase Remeron  to 30 mg, current prescribed dose

## 2024-08-30 NOTE — ED Notes (Addendum)
 Pt was finally able to take med. Takes medication whole with sips of water . Does not like with applesauce or crushed. Told pt I had medication for her and she allowed me to help her dump them in mouth

## 2024-08-30 NOTE — ED Notes (Signed)
 Attempted to give meds. Pt refused. Attempted to mix meds in applesauce grounded and pt spit at nurse. Attempted 2nd time with smaller amount and pt spit it out again

## 2024-08-30 NOTE — Consult Note (Signed)
 Medication transcript sent from [facility] assisted living facility  [facility] psychotropic medications as followed -Escitalopram  10 mg, 1 tablet by mouth in the morning for agitation -Lamictal  25 mg, 2 tablets by mouth in the evening for behaviors -Lamictal  25 mg in the morning for behaviors -Lorazepam  1 mg, give 1 tablet by mouth 3 times a day for anxiety and agitation, hold for sedation -Remeron  30 mg, give 1 tablet by mouth at bedtime for depression -Rivastigmine transdermal patch 24 hour 9.5 mg/24HR, apply 1 patch transdermally one time a day (for dementia behaviors)   Current ED psychotropics ordered -Lexapro  20 mg daily -Wellbutrin  75 mg daily -Lamictal  25 mg daily -Lamictal  50 mg at bedtime -Ativan  0.5 mg twice daily -Remeron  7.5 mg at bedtime   Medication adjustments made on 08/30/2024 Will make medication adjustments to reflect current psychotropic regimen as prescribed at [facility], consider adding antipsychotic medication Seroquel  for agitation once psychiatric evaluation has been completed.  -Will DC Wellbutrin  as patient is not currently prescribed medication  -Will adjust Ativan  to 0.5 mg 3 times daily to prevent benzodiazepine withdrawal due to patient taking 1 mg 3 times daily at [facility] facility, will need to monitor for confusion and fall risks as benzodiazepines are not recommended in the elderly population for those reasons  -Will decrease Lexapro  to 10 mg, current prescribed dose  -Continue Lamictal  25 mg daily  -Continue Lamictal  50 mg at bedtime  -Increase Remeron  to 30 mg, current prescribed dose

## 2024-08-30 NOTE — ED Notes (Addendum)
 [facility] called Powell Irving, pts DSS LG for collateral information. Powell was recently appointed as pts guardian.  Powell reports that pts facility [facility]) noted that pts behavior has changed recently, acting abnormally for her. Pt was acting out towards other residents and throwing things. The facility tried to obtain a urine sample suspecting that she may have a UTI but were unable to because of escalation of her behaviors. The police were called and BHRT IVC'd pt and was brought to the [facility] ED. Powell believes that pt has been taking 30 mg. of Remeron , 1 mg. Of Lorazapam 3xs daily, and 20 mg. of Lithium daily. [facility] will obtain a MAR from pts facility. Powell is unaware of any inpatient psychiatric admissions for pt or any previous reports of SI/HI by pt.   [facility] called pts facility for collateral information and to obtain pts MAR. [facility] spoke with Deliah Lunger, Director of Health and Wellness who reports that pt is normally a feisty person but her behaviors over the last few days have intensified. [facility] reports that pt does not have a dementia diagnosis. Pts facility has a psychiatrist and PCP that visits weekly. [facility] is faxing a list of pts current medications for review. [facility] does not believe pt has ever been admitted to an inpatient psychiatric facility.   [facility] received pts current medication list and uploaded to pts chart.  Chesley Holt, [facility]  08/30/24

## 2024-08-30 NOTE — BH Assessment (Signed)
 Per Prentice, RN, pt is too somnolent to participate in an assessment. Pt was given medications due to agitation. TTS will be notified once pt is able to participate in a telehealth assessment.

## 2024-08-30 NOTE — Discharge Instructions (Signed)
 Please contact Garnette, Education Officer, Community with [facility] for assistance with memory care placement.  Phone: [phone]  If you wish to hire private duty care at [facility], you may contact any of the agencies listed below.   Private Pay Resources  [facility] Address: [address] [place], [place] [address] Phone: [phone]  [facility] Address: [address], [place], [place] [address] Phone: [phone]  [facility] Address: [address] [place], [place] [address] Phone: [phone]  [facility] Address: [address] [place], [place] [address] Phone: [phone]  [facility] Address: [address] [place], [place], [place] [address] Phone: [phone]  [facility] Phone: [phone]  [facility] Address:  [address], [place], [place] [address] Phone:  [phone]  [facility] Address:  [address] Phone:  [phone]  [email or web address]  [employer] [employer] Phone: [phone]

## 2024-08-31 ENCOUNTER — Encounter (HOSPITAL_COMMUNITY): Payer: Self-pay | Admitting: Emergency Medicine

## 2024-08-31 DIAGNOSIS — R45851 Suicidal ideations: Secondary | ICD-10-CM | POA: Diagnosis not present

## 2024-08-31 DIAGNOSIS — R451 Restlessness and agitation: Secondary | ICD-10-CM | POA: Diagnosis not present

## 2024-08-31 DIAGNOSIS — F329 Major depressive disorder, single episode, unspecified: Secondary | ICD-10-CM

## 2024-08-31 DIAGNOSIS — F419 Anxiety disorder, unspecified: Secondary | ICD-10-CM | POA: Diagnosis not present

## 2024-08-31 DIAGNOSIS — F03918 Unspecified dementia, unspecified severity, with other behavioral disturbance: Secondary | ICD-10-CM | POA: Diagnosis not present

## 2024-08-31 LAB — URINALYSIS, ROUTINE W REFLEX MICROSCOPIC
Bilirubin Urine: NEGATIVE
Glucose, UA: NEGATIVE mg/dL
Hgb urine dipstick: NEGATIVE
Ketones, ur: 20 mg/dL — AB
Leukocytes,Ua: NEGATIVE
Nitrite: NEGATIVE
Protein, ur: NEGATIVE mg/dL
Specific Gravity, Urine: 1.021 (ref 1.005–1.030)
pH: 5 (ref 5.0–8.0)

## 2024-08-31 LAB — URINE DRUG SCREEN
Amphetamines: NEGATIVE
Barbiturates: NEGATIVE
Benzodiazepines: POSITIVE — AB
Cocaine: NEGATIVE
Fentanyl: NEGATIVE
Methadone Scn, Ur: NEGATIVE
Opiates: NEGATIVE
Tetrahydrocannabinol: NEGATIVE

## 2024-08-31 MED ORDER — QUETIAPINE FUMARATE 25 MG PO TABS: 12.5000 mg | ORAL_TABLET | Freq: Three times a day (TID) | ORAL | Status: DC | PRN

## 2024-08-31 MED ORDER — QUETIAPINE FUMARATE 25 MG PO TABS
12.5000 mg | ORAL_TABLET | Freq: Every day | ORAL | Status: DC
  Administered 2024-09-01 – 2024-09-03 (×3): 12.5 mg via ORAL
  Filled 2024-08-31 (×4): qty 1

## 2024-08-31 NOTE — Consult Note (Signed)
 [facility] Psychiatric Consult Initial  Patient Name: .Holly Salazar  MRN: [id number]  DOB: October 09, 1935  Consult Order details:  Orders (From admission, onward)     Start     Ordered   08/30/24 0005  CONSULT TO CALL ACT TEAM       Ordering Provider: Melvenia Motto, MD  Provider:  (Not yet assigned)  Question:  Reason for Consult?  Answer:  Psych consult   08/30/24 0004             Mode of Visit: In person    Psychiatry Consult Evaluation  Service Date: August 31, 2024 LOS:  LOS: 0 days  Chief Complaint I want to die.  Primary Psychiatric Diagnoses  Major depressive disorder 2.   Anxiety  Assessment  Holly Salazar is a 89 y.o. female admitted: Presented to the EDfor 08/29/2024  6:42 PM for wanting to die. She carries the psychiatric diagnoses of depression and anxiety and has a past medical history of hypertension and GERD.   89 year old female presenting with acute suicidal ideation, severe agitation, aggression, and altered mental status significantly worse than baseline. Patient has demonstrated dangerous behaviors, including attempting to obtain a firearm and repeated attempts to get out of bed, requiring restraints for safety. She remains intermittently noncompliant with medications and is unable to participate meaningfully in care or ensure her own safety.  While medical contributors such as UTI or delirium remain possible and are being evaluated, patients current psychiatric presentation represents an imminent danger to self and cannot be safely managed in a skilled nursing facility or outpatient setting at this time. Please see plan below for detailed recommendations.   Diagnoses:  Active Hospital problems: Principal Problem:   Dementia with behavioral disturbance (HCC)    Plan   ## Psychiatric Medication Recommendations:  Continue Lexapro  10 mg daily Continue Lamictal  25 mg daily and 50 mg at bedtime Continue Ativan  0.5 mg three times daily Continue Remeron  30  mg at bedtime Start Seroquel  12.5 mg p.o. at bedtime  Start Seroquel  12.5 p.o. every 8-12 hours as needed for agitation  ## Medical Decision Making Capacity: Patient has a guardian and has thus been adjudicated incompetent; please involve patients guardian in medical decision making  ## Further Work-up:  -- Urinalysis is needed EKG, While pt on Qtc prolonging medications, please monitor & replete K+ to 4 and Mg2+ to 2, U/A, or UDS -- most recent EKG on 08/31/2024 had QtC of 442 -- Pertinent labwork reviewed earlier this admission includes: CBC, CMP, EKG, UDS   ## Disposition:-- We recommend inpatient psychiatric hospitalization after medical hospitalization. Patient has been involuntarily committed on 08/30/2024.   ## Behavioral / Environmental: -Delirium Precautions: Delirium Interventions for Nursing and Staff: - RN to open blinds every AM. - To Bedside: Glasses, hearing aide, and pt's own shoes. Make available to patients. when possible and encourage use. - Encourage po fluids when appropriate, keep fluids within reach. - OOB to chair with meals. - Passive ROM exercises to all extremities with AM & PM care. - RN to assess orientation to person, time and place QAM and PRN. - Recommend extended visitation hours with familiar family/friends as feasible. - Staff to minimize disturbances at night. Turn off television when pt asleep or when not in use., Difficult Patient (SELECT OPTIONS FROM BELOW), To minimize splitting of staff, assign one staff person to communicate all information from the team when feasible., or Utilize compassion and acknowledge the patient's experiences while setting clear and realistic expectations for  care.    ## Safety and Observation Level:  - Based on my clinical evaluation, I estimate the patient to be at low risk of self harm in the current setting. - At this time, we recommend  1:1 Observation. This decision is based on my review of the chart including patient's history  and current presentation, interview of the patient, mental status examination, and consideration of suicide risk including evaluating suicidal ideation, plan, intent, suicidal or self-harm behaviors, risk factors, and protective factors. This judgment is based on our ability to directly address suicide risk, implement suicide prevention strategies, and develop a safety plan while the patient is in the clinical setting. Please contact our team if there is a concern that risk level has changed.  CSSR Risk Category:C-SSRS RISK CATEGORY: Low Risk  Suicide Risk Assessment: Patient has following modifiable risk factors for suicide: current symptoms: anxiety/panic, insomnia, impulsivity, anhedonia, hopelessness, which we are addressing by recommending inpatient psychiatric admission. Patient has following non-modifiable or demographic risk factors for suicide: unable to assess Patient has the following protective factors against suicide: Supportive friends  Thank you for this consult request. Recommendations have been communicated to the primary team.  We will continue to follow patient at this time.   CATHALEEN ADAM, PMHNP       History of Present Illness  Relevant Aspects of Hospital ED Course:  Admitted on 08/29/2024 for wanting to die  Patient Report:  89 year old female was brought to the emergency department from [facility] after exhibiting suicidal ideation, increased agitation, and behavior significantly altered from baseline. Per report, patient attempted to grab a firearm from a hydrographic surveyor, stating she wanted to die. EMS was activated due to concerns for patient safety.  Patient has remained in the emergency department for approximately 48 hours and continues to demonstrate agitated, aggressive, and unsafe behaviors, including yelling at staff, calling staff derogatory names, and repeatedly attempting to get out of bed, placing herself at risk for injury. Patient has  required intermittent restraints to prevent self-harm and falls.  Patient has been intermittently noncompliant with oral medications, including spitting out medications yesterday; however, she has been compliant with medications today. Urinalysis has not yet been obtained to rule out urinary tract infection due to patients agitation and inability to safely cooperate with specimen collection.  Patient has a legal guardian through DSS.  Per chart review, Behavioral Health Coordinator spoke with [facility] facility staff, who report a significant escalation in behaviors over the past several days. Facility reports no known formal diagnosis of dementia.  Per RN documentation at 1700, patient is resting in bed but frequently attempts to fidget and get up. Safety mittens remain in place to prevent self-harm. Patient remains disoriented and responds only to name, with incoherent verbalizations.  Risk Assessment Acute risk factors: Suicidal ideation, attempt to grab firearm, severe agitation, disorientation, medication nonadherence, inability to follow commands Chronic risk factors: Advanced age, cognitive impairment, institutional living, limited insight Protective factors: Legal guardian involvement Overall risk: High and imminent   Psych ROS:  Depression: Positive Anxiety: Positive Mania (lifetime and current): Unable to assess Psychosis: (lifetime and current): Unable to assess  Collateral information:  Legal guardian  Review of Systems  Psychiatric/Behavioral:  Positive for depression and suicidal ideas.      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  Patient UDS is positive only for benzodiazepines which she is prescribed.  Exam Findings  Physical Exam:  Vital Signs:  Temp:  [97.6 F (36.4 C)-98.7 F (37.1 C)] 98.2 F (36.8 C) (01/08 1300) Pulse Rate:  [83-100] 92 (01/08 1300) Resp:  [17-18] 18 (01/08 1300) BP: (125-157)/(56-108) 157/56 (01/08 1300) SpO2:  [95 %-98 %] 97 % (01/08 1300) Blood pressure (!) 157/56, pulse 92, temperature 98.2 F (36.8 C), temperature source Oral, resp. rate 18, SpO2 97%. There is no height or weight on file to calculate BMI.  Physical Exam Neurological:     Mental Status: She is alert.  Psychiatric:        Attention and Perception: She is inattentive.        Mood and Affect: Mood is anxious.        Behavior: Behavior is withdrawn.        Thought Content: Thought content includes suicidal ideation.        Judgment: Judgment is impulsive and inappropriate.     Comments: Speech very low volume, mumbled, largely incoherent     Mental Status Exam: General Appearance: Lying in bed with bilateral soft mitten restraints in place; intermittently fidgeting; requires frequent redirection  Orientation:  Other:  Oriented to name only  Memory:  Immediate;   unable to assess  Concentration:  Concentration: Poor  Recall:  Poor  Attention  Poor  Eye Contact:  Fair  Speech:  Mumbled, largely incoherent  Language:  Poor  Volume:  Decreased  Mood: Unable to reliably assess  Affect: Unable to reliably assess  Thought Process: Disorganized  Thought Content: Recent suicidal ideation with dangerous behavior  Suicidal Thoughts:  Yes.  without intent/plan  Homicidal Thoughts:  No   Judgement:  Impaired  Insight:  Unable to assess  Psychomotor Activity:  Increased  Akathisia:  No  Fund of Knowledge:  Fair      Assets:  Manufacturing Systems Engineer Desire for Improvement Financial Resources/Insurance Resilience Social Support  Cognition:  Impaired,  Moderate  ADL's:  Intact  AIMS (if indicated):        Other History   These have been pulled in through the EMR, reviewed, and updated if appropriate.  Family History:  The patient's family history includes Cancer in her father; Heart disease in her mother.  Medical History: Past Medical History:  Diagnosis Date   Arthritis    knee and back   Cancer (HCC)    melanoma on back   Hypertension    Osteoporosis     Surgical History: Past Surgical History:  Procedure Laterality Date   APPENDECTOMY     CARPAL TUNNEL RELEASE Right    COLONOSCOPY     FEMUR IM NAIL Left 10/25/2019   Procedure: INTRAMEDULLARY (IM) NAIL FEMORAL;  Surgeon: Barbarann Oneil BROCKS, MD;  Location: [facility];  Service: Orthopedics;  Laterality: Left;   HIP FRACTURE SURGERY Right    OPEN REDUCTION INTERNAL FIXATION (ORIF) DISTAL RADIAL FRACTURE Left 01/16/2016   Procedure: OPEN REDUCTION INTERNAL FIXATION (ORIF) LEFT  DISTAL RADIUS FRACTURE WITH REPAIR RECONSTRUCTION AS NEEDED ;  Surgeon: Elsie Mussel, MD;  Location: [facility];  Service: Orthopedics;  Laterality: Left;   TONSILLECTOMY       Medications:  Current Medications[1]  Allergies: Allergies[2]  Annaleia Pence MOTLEY-MANGRUM, PMHNP      [1]  Current Facility-Administered Medications:    acetaminophen  (TYLENOL ) tablet 500 mg, 500 mg, Oral, Q6H, Dixon, Ryan, MD, 500 mg at 08/31/24 1059   escitalopram  (LEXAPRO ) tablet 10 mg, 10 mg, Oral, Daily, White, Patrice L, NP, 10 mg at 08/31/24 9041   lamoTRIgine  (LAMICTAL ) tablet 25 mg, 25 mg, Oral, Daily, Melvenia Motto, MD, 25 mg at 08/31/24 9040   lamoTRIgine  (LAMICTAL ) tablet 50 mg, 50 mg, Oral, QHS, Dixon, Motto, MD, 50 mg at 08/30/24 2136    levothyroxine  (SYNTHROID ) tablet 75 mcg, 75 mcg, Oral, QAC breakfast, Melvenia Motto, MD, 75 mcg at 08/31/24 9177   LORazepam  (ATIVAN ) tablet 0.5 mg, 0.5 mg, Oral, TID, White, Patrice L, NP, 0.5 mg at 08/31/24 1520   midazolam  PF (VERSED ) injection 1 mg, 1 mg, Intramuscular, Once, Rogelia Jerilynn RAMAN, MD   mirtazapine  (REMERON ) tablet 30 mg, 30 mg, Oral, QHS, White, Patrice L, NP, 30 mg at 08/30/24 2136   OLANZapine  zydis (ZYPREXA ) disintegrating tablet 5 mg, 5 mg, Oral, Once, Rogelia Jerilynn RAMAN, MD   pantoprazole  (PROTONIX ) EC tablet 40 mg, 40 mg, Oral, Daily, Melvenia Motto, MD, 40 mg at 08/31/24 9040  Current Outpatient Medications:    acetaminophen  (TYLENOL ) 500 MG tablet, Take 1,000 mg by mouth in the morning and at bedtime., Disp: , Rfl:    ascorbic acid  (VITAMIN C ) 500 MG tablet, Take 500 mg by mouth daily., Disp: , Rfl:    Cholecalciferol (VITAMIN D3) 50 MCG (2000 UT) CAPS, Take 1 capsule by mouth daily., Disp: , Rfl:    escitalopram  (LEXAPRO ) 10 MG tablet, Take 10 mg by mouth daily., Disp: , Rfl:    ferrous sulfate 325 (65 FE) MG tablet, Take 325 mg by mouth every Monday, Wednesday, and Friday., Disp: , Rfl:    lamoTRIgine  (LAMICTAL ) 25 MG tablet, Take 25-50 mg by mouth See admin instructions. Take one tablet by mouth in the morning and then take 2 tablets by mouth in the evening for behaviors per MAR, Disp: , Rfl:    levothyroxine  (SYNTHROID ) 75 MCG tablet, Take 75 mcg by mouth daily before breakfast., Disp: , Rfl:    LORazepam  (ATIVAN ) 1 MG tablet, Take 1 mg by mouth 3 (three) times daily., Disp: , Rfl:    mirtazapine  (REMERON ) 30 MG tablet, Take 30 mg by mouth at bedtime., Disp: , Rfl:    Multiple Vitamin (MULTIVITAMIN WITH MINERALS) TABS tablet, Take 1 tablet by mouth daily., Disp: , Rfl:    pantoprazole  (PROTONIX ) 40 MG tablet, Take 1 tablet (40 mg total) by mouth daily., Disp: 28 tablet, Rfl: 0   polycarbophil (FIBERCON) 625 MG tablet, Take 625 mg by mouth in the morning  and at bedtime., Disp: , Rfl:    rivastigmine (EXELON) 9.5 mg/24hr, Place 9.5 mg onto the skin daily., Disp: , Rfl:    ondansetron  (ZOFRAN ) 4 MG tablet, Take 1 tablet (4 mg total) by mouth every 6 (six) hours. (Patient not taking: Reported on 08/29/2024), Disp: 12 tablet, Rfl: 0 [2] No Known Allergies

## 2024-08-31 NOTE — ED Provider Notes (Signed)
 Patient has become agitated, requires restraints to keep her from harming staff and to keep her from getting out of bed and injuring herself.   Raford Lenis, MD 08/31/24 [phone]

## 2024-08-31 NOTE — Progress Notes (Addendum)
 LCSW Progress Note   [id number]   Holly Salazar   08/31/2024   9:19pm   Description:   Inpatient Psychiatric Referral   Patient was recommended inpatient per [facility], Cathaleen LABOR, PMHNP . There are no available beds at [facility], per [facility] Luke Million, RN Patient was referred to the following out of network facilities:  Destination  Service Provider Request Status Address Phone Fax  [facility]  Pending - Request Sent [address] [place] [address] [phone]  [facility]  Pending - Request Sent [address] [place], [place] [address] [phone]  [facility]  Pending - Request Sent [address] [place] [address] [phone]  [facility]  Pending - Request Sent [address], [place] [address] [phone]  [facility]  Pending - Request Sent [address] [place] [address]  [phone]  [facility]  Pending - Request Sent [address] [place] [phone]     Situation ongoing, CSW to continue following and update chart as more information becomes available.   Niel Nightingale, MSW, LCSW

## 2024-08-31 NOTE — ED Provider Notes (Signed)
 Emergency Medicine Observation Re-evaluation Note  Holly Salazar is a 89 y.o. female, seen on rounds today.  Pt initially presented to the ED for complaints of Suicidal, Altered Mental Status, and IVC Currently, the patient is in her bed, unfortunately in soft mitt restraints.  Physical Exam  BP (!) 156/84   Pulse 100   Temp 98.7 F (37.1 C) (Oral)   Resp 17   SpO2 98%  Physical Exam General: Awake and alert Cardiac: Normal rate Lungs: Normal effort Psych: Mood is appropriate  ED Course / MDM  EKG:EKG Interpretation Date/Time:  Tuesday August 29 2024 22:38:32 EST Ventricular Rate:  75 PR Interval:  195 QRS Duration:  88 QT Interval:  395 QTC Calculation: 442 R Axis:   68  Text Interpretation: Sinus rhythm Nonspecific T abnormalities, lateral leads Confirmed by Melvenia Motto [phone]) on 08/29/2024 10:44:40 PM  I have reviewed the labs performed to date as well as medications administered while in observation.  Recent changes in the last 24 hours include patient required some medications overnight for agitation..  Plan  Current plan is for psych evaluation and disposition.  Likely will require placement.   Mannie Pac T, DO 08/31/24 [phone]

## 2024-08-31 NOTE — ED Notes (Signed)
 Will straight catheterize once staff becomes available.

## 2024-08-31 NOTE — ED Notes (Signed)
 Pt is currently resting in bed. Often has to be redirected to not fidget and try to get up. Safety mittens in place to prevent pt self harm. Pt disoriented and mumbles incoherent words. When speaking to pt, she responds to her name, but does not give coherent answers to orientation questions.

## 2024-09-01 DIAGNOSIS — R45851 Suicidal ideations: Secondary | ICD-10-CM

## 2024-09-01 DIAGNOSIS — R451 Restlessness and agitation: Secondary | ICD-10-CM

## 2024-09-01 NOTE — Progress Notes (Signed)
 LCSW Progress Note   [id number]   Brandee Markin   09/01/2024   9:30am   Description:   Inpatient Psychiatric Referral   Patient was recommended inpatient per [facility], Cathaleen LABOR, PMHNP . There are no available beds at [facility], per [facility] Cherylynn Ernst, RN Patient was referred to the following out of network facilities:  Destination  Service Provider Request Status Address Phone Fax  [facility]  Pending - Request Sent [address] [place] [address] [phone]  [facility]  Pending - Request Sent [address] [place], [place] [address] [phone]  [facility]  Pending - Request Sent [address] [place], [place] [address] [phone]  [facility]  Pending - Request Sent [address] [place] [address] [phone]  [facility]  Pending - Request Sent [address] [place] [phone]  [facility]  Pending - Request Sent [address] [place] [address] [phone]    Situation ongoing, CSW to continue following and update chart as more information becomes available.    Niel Nightingale, MSW, LCSW

## 2024-09-01 NOTE — ED Provider Notes (Signed)
 Emergency Medicine Observation Re-evaluation Note  Holly Salazar is a 89 y.o. female, seen on rounds today.  Pt initially presented to the ED for complaints of behavioral symptoms Currently, the patient is asleep, overnight RN reports patient has been refusing all meds.  Physical Exam  BP (!) 115/46 (BP Location: Left Arm)   Pulse 73   Temp 98.6 F (37 C) (Oral)   Resp 17   SpO2 98%  Physical Exam General: Asleep, no acute distress Cardiac: Regular rate Lungs: No increased WOB Psych: Calm, asleep  ED Course / MDM  EKG:EKG Interpretation Date/Time:  Tuesday August 29 2024 22:38:32 EST Ventricular Rate:  75 PR Interval:  195 QRS Duration:  88 QT Interval:  395 QTC Calculation: 442 R Axis:   68  Text Interpretation: Sinus rhythm Nonspecific T abnormalities, lateral leads Confirmed by Melvenia Motto [phone]) on 08/29/2024 10:44:40 PM  I have reviewed the labs performed to date as well as medications administered while in observation.  Recent changes in the last 24 hours include patient remains medically cleared. Recommended inpatient psych.  Plan  Current plan is for inpatient psych.    Kingsley, Lekha Dancer K, DO 09/01/24 [phone]

## 2024-09-01 NOTE — Progress Notes (Addendum)
 Patient has been sleeping all morning and afternoon. The patient's sitter is to notify this provider when patient awakens to complete follow up psych assessment.  I spoke to Calton Bras [phone] this afternoon who identifies herself as the librarian, academic for [employer] living. She states that over the course of 2 weeks the patient has been having outbursts and suicidal tendencies. She states that the patient has been combative and threatening to hurt herself and asking other residents to kill her. She states that at baseline the patient can tell he what she has eaten, if she has taken a shower and the day of the week. She also states that at baseline the patient is confused and depending on if she is having a good day she can have a conversation. She states that at baseline the patient uses a wheelchair and every blue moon she may use a walker.

## 2024-09-01 NOTE — Progress Notes (Signed)
 Inpatient Psychiatric Referral  Patient was recommended inpatient per Jadeka Motley-Mangrum, NP. There are no available beds at [facility], per [facility]. Patient was referred to the following out of network facilities:  [facility] Provider Address Phone [facility]  [address] [place] [address] [phone]  [facility]  [address] [place], [place] [address] [phone]  [facility]  [address] [place], [place] [address] [phone]  [facility]  [address] [place] [address] [phone]  [facility]  [address] [place] [address] [phone]  [facility]  [address] [place], [place] [phone]  [facility]  [address] [place] [address] [phone]  [facility]  [address], [place] [address] [phone]  [facility]  [address] [place] [address] [phone]  [facility]  [address], [place] [address] [phone]    Situation ongoing, CSW to continue following and update chart as more information becomes available.   Harrie Sofia MSW, [place] 09/01/2024

## 2024-09-01 NOTE — Consult Note (Cosign Needed Addendum)
 [facility] Psychiatric Consult Initial  Patient Name: .Holly Salazar  MRN: [id number]  DOB: 1935-09-24  Consult Order details:  Orders (From admission, onward)     Start     Ordered   08/30/24 0005  CONSULT TO CALL ACT TEAM       Ordering Provider: Melvenia Motto, MD  Provider:  (Not yet assigned)  Question:  Reason for Consult?  Answer:  Psych consult   08/30/24 0004             Mode of Visit: In person    Psychiatry Consult Evaluation  Service Date: September 01, 2024 LOS:  LOS: 0 days  Chief Complaint for making suicidal statements, stating that she wants to die and aggression  Primary Psychiatric Diagnoses  Major depressive disorder   Assessment  Zakiya Sporrer is a 89 y.o. female admitted: Presented to the ED on 08/29/2024  6:42 PM for making suicidal statements, stating that she wants to die and aggression. She carries the psychiatric diagnoses of depression and anxiety and has a past medical history of hypertension and GERD. Patient has no formal neurocognitive disorder diagnosis. However, at baseline she is confused.  Patient presented from [facility] facility on 08/29/2024 under IVC due to worsening aggression and making suicidal statements. Over the course of the patient's stay, from 1/6 to 1/8 she has been exhibiting aggressive behaviors described as screaming, trying to get out of the bed, biting, banging her head and using derogatory language towards staff. On 08/31/24, she was started on Seroquel  12.5 mg po QHS for mood stabilization/agitation. She has shown improvement since starting Seroquel  as evidenced by decreased agitation and has not required intramuscular agitation medications or restraints in the past 24 hours. She had been refusing oral medications. However, per [facility], patient has been compliant with taking medications today. Urinalysis was completed on 08/31/2024 and is negative for UTI. Patient's progressive symptoms may be related to undiagnosed  neurocognitive disorder. Per chart review, patient has not been seen by outpatient neurology and has an upcoming appointment with neurology this month for an evaluation.   On reevaluation today, 09/01/2024, patient has been sleeping throughout the morning and this afternoon. On approach, late this afternoon patient was lying down in bed with her eyes closed in no acute distress. Patient was awaken by verbal command and soft touch to her arm. Patient opened her eyes but would not verbally respond to this provider on evaluation. She vaguely stated that her neck hurt but would not further elaborate. I attempted to engage the patient to get additional information pertaining to her pain and she only stated that her body hurts all over. Per sitter, patient has been repositioned in bed to help reduce discomfort. Per MAR, she was given Tylenol  PO at 2:16 PM for pain. Again, I attempted to engage the patient to complete the psychiatric evaluation. However, she would not participate in the assessment. Per sitter, patient has had minimum fluid intake with prompting and has refused meals. Sitter continues to offer meals, snack and fluids. Patient has not displayed any aggression, self-harm or psychotic behaviors throughout the day.   Diagnoses:  Active Hospital problems: Principal Problem:   Dementia with behavioral disturbance (HCC)    Plan   ## Psychiatric Medication Recommendations:  Continue Lexapro  10 mg daily Continue Lamictal  25 mg daily and 50 mg at bedtime Continue Ativan  0.5 mg three times daily Continue Remeron  30 mg at bedtime Continue Seroquel  12.5 mg p.o. at bedtime  ## Medical Decision Making  Capacity: Patient has a guardian and has thus been adjudicated incompetent; please involve patients guardian in medical decision making  ## Further Work-up:   EKG, While pt on Qtc prolonging medications, please monitor & replete K+ to 4 and Mg2+ to 2, U/A, or UDS  -- most recent EKG on 08/31/2024 had QtC  of 442  -- Pertinent labwork reviewed earlier this admission includes: CBC, CMP, EKG, UDS   ## Disposition:-- We recommend inpatient psychiatric hospitalization after medical hospitalization. Patient has been involuntarily committed on 08/30/2024.  Patient continues to be declined for inpatient psychiatric placement. Will continue to observe, and adjustment medications accordingly while in the emergency department to treat symptoms. Patient to return back to [facility] once psychiatrically stable.  ## Behavioral / Environmental: -Delirium Precautions: Delirium Interventions for Nursing and Staff: - RN to open blinds every AM. - To Bedside: Glasses, hearing aide, and pt's own shoes. Make available to patients. when possible and encourage use. - Encourage po fluids when appropriate, keep fluids within reach. - OOB to chair with meals. - Passive ROM exercises to all extremities with AM & PM care. - RN to assess orientation to person, time and place QAM and PRN. - Recommend extended visitation hours with familiar family/friends as feasible. - Staff to minimize disturbances at night. Turn off television when pt asleep or when not in use., Difficult Patient (SELECT OPTIONS FROM BELOW), To minimize splitting of staff, assign one staff person to communicate all information from the team when feasible., or Utilize compassion and acknowledge the patient's experiences while setting clear and realistic expectations for care.    ## Safety and Observation Level:  - Based on my clinical evaluation, I estimate the patient to be at low risk of self harm in the current setting. - At this time, we recommend  1:1 Observation. This decision is based on my review of the chart including patient's history and current presentation, interview of the patient, mental status examination, and consideration of suicide risk including evaluating suicidal ideation, plan, intent, suicidal or self-harm behaviors, risk factors,  and protective factors. This judgment is based on our ability to directly address suicide risk, implement suicide prevention strategies, and develop a safety plan while the patient is in the clinical setting. Please contact our team if there is a concern that risk level has changed.  CSSR Risk Category:C-SSRS RISK CATEGORY: Low Risk  Suicide Risk Assessment: Patient has following modifiable risk factors for suicide: current symptoms: anxiety/panic, insomnia, impulsivity, anhedonia, hopelessness, which we are addressing by recommending inpatient psychiatric admission. Patient has following non-modifiable or demographic risk factors for suicide: unable to assess Patient has the following protective factors against suicide: Supportive friends  Thank you for this consult request. Recommendations have been communicated to the primary team.  We will continue to follow patient at this time.   Amberly Livas, Wyline CROME, NP       History of Present Illness  Relevant Aspects of Hospital ED Course: Per Dr. Melvenia, EDP note, Patient presents for suicidal ideation. Medical history includes HTN, osteoporosis. It is unclear if she has a documented history of dementia, however, she is prescribed rivastigmine. She arrives from [place] nursing facility. Patient reportedly had onset of agitation and threats of suicide this afternoon. She was given a dose of Ativan  at the facility. Police were called to the scene and she reportedly attempted to grab one of their pistols. EMS gave 2.5 mg of Haldol  intramuscularly and 2.5 mg of Versed  intravenously. She has had  improvement in her agitation. She continues to endorse suicidal ideation.  Psych ROS:  Depression: Positive Anxiety: Positive Mania (lifetime and current): Unable to assess Psychosis: (lifetime and current): Unable to assess  Collateral information:  I spoke to Calton Bras [phone] this afternoon who identifies herself as the librarian, academic for  [employer] living. She states that over the course of 2 weeks the patient has been having outbursts and suicidal tendencies. She states that the patient has been combative and threatening to hurt herself and asking other residents to kill her. She states that at baseline the patient can tell he what she has eaten, if she has taken a shower and the day of the week. She also states that at baseline the patient is confused and depending on if she is having a good day she can have a conversation. She states that at baseline the patient uses a wheelchair and every blue moon she may use a walker.   Review of Systems  Musculoskeletal:  Positive for joint pain and neck pain.     Psychiatric and Social History  Psychiatric History:  Information collected from EMR and [facility]  Prev Dx/Sx: History of Depression and Anxiety Current Psych Provider: Yes Home Meds (current): Yes, Lexapro  10 mg daily, Lamictal  25 mg daily and 50 mg at bedtime, Ativan  0.5 mg three times daily, Remeron  30 mg at bedtime Therapy: No   Prior Psych Hospitalization: UTA Prior Self Harm: Yes Prior Violence: Yes  Family Psych History: UTA Family Hx suicide: UTA  Social History:  Developmental Hx: UTA Educational Hx: UTA Occupational Hx: UTA Legal Hx: None  Living Situation: Lives at [facility]  Spiritual Hx: Unknown Access to weapons/lethal means: No  Substance History UTA - Patient UDS positive for benzodiazepines - Patient is prescribed Ativan    Exam Findings  Physical Exam:  Vital Signs:  Temp:  [98.4 F (36.9 C)-98.6 F (37 C)] 98.6 F (37 C) (01/09 0536) Pulse Rate:  [73-97] 73 (01/09 0536) Resp:  [17-18] 17 (01/09 0536) BP: (115-136)/(46-88) 115/46 (01/09 0536) SpO2:  [95 %-98 %] 98 % (01/09 0536) Blood pressure (!) 115/46, pulse 73, temperature 98.6 F (37 C), temperature source Oral, resp. rate 17, SpO2 98%. There is no height or weight on file to calculate  BMI.  Physical Exam Neurological:     Mental Status: She is alert.  Psychiatric:        Cognition and Memory: Cognition is impaired.     Mental Status Exam: General Appearance: Lying in bed with bilateral soft mitten restraints in place; intermittently fidgeting; requires frequent redirection  Orientation:  Other:  Oriented to name only  Memory:  Immediate;   unable to assess  Concentration:  Concentration: Poor  Recall:  Poor  Attention  Poor  Eye Contact:  Fair  Speech:  Mumbled, largely incoherent  Language:  Poor  Volume:  Decreased  Mood: Unable to reliably assess  Affect: Unable to reliably assess  Thought Process: Disorganized  Thought Content: Recent suicidal ideation with dangerous behavior  Suicidal Thoughts:  Yes.  without intent/plan  Homicidal Thoughts:  No  Judgement:  Impaired  Insight:  Unable to assess  Psychomotor Activity:  Increased  Akathisia:  No  Fund of Knowledge:  Fair      Assets:  Manufacturing Systems Engineer Desire for Improvement Financial Resources/Insurance Resilience Social Support  Cognition:  Impaired,  Moderate  ADL's:  Intact  AIMS (if indicated):   N/A  Other History   These have been pulled in through the EMR, reviewed, and updated if appropriate.  Family History:  The patient's family history includes Cancer in her father; Heart disease in her mother.  Medical History: Past Medical History:  Diagnosis Date   Arthritis    knee and back   Cancer (HCC)    melanoma on back   Hypertension    Osteoporosis     Surgical History: Past Surgical History:  Procedure Laterality Date   APPENDECTOMY     CARPAL TUNNEL RELEASE Right    COLONOSCOPY     FEMUR IM NAIL Left 10/25/2019   Procedure: INTRAMEDULLARY (IM) NAIL FEMORAL;  Surgeon: Barbarann Oneil BROCKS, MD;  Location: [facility];  Service: Orthopedics;  Laterality: Left;   HIP FRACTURE SURGERY Right    OPEN REDUCTION INTERNAL FIXATION (ORIF) DISTAL RADIAL FRACTURE Left 01/16/2016    Procedure: OPEN REDUCTION INTERNAL FIXATION (ORIF) LEFT  DISTAL RADIUS FRACTURE WITH REPAIR RECONSTRUCTION AS NEEDED ;  Surgeon: Elsie Mussel, MD;  Location: [facility] OR;  Service: Orthopedics;  Laterality: Left;   TONSILLECTOMY       Medications:  Current Medications[1]  Allergies: Allergies[2]  Idalys Konecny L, NP        [1]  Current Facility-Administered Medications:    acetaminophen  (TYLENOL ) tablet 500 mg, 500 mg, Oral, Q6H, Melvenia Motto, MD, 500 mg at 09/01/24 1416   escitalopram  (LEXAPRO ) tablet 10 mg, 10 mg, Oral, Daily, Kalynn Declercq L, NP, 10 mg at 09/01/24 1416   lamoTRIgine  (LAMICTAL ) tablet 25 mg, 25 mg, Oral, Daily, Melvenia Motto, MD, 25 mg at 09/01/24 1416   lamoTRIgine  (LAMICTAL ) tablet 50 mg, 50 mg, Oral, QHS, Melvenia Motto, MD, 50 mg at 08/30/24 2136   levothyroxine  (SYNTHROID ) tablet 75 mcg, 75 mcg, Oral, QAC breakfast, Melvenia Motto, MD, 75 mcg at 09/01/24 0800   LORazepam  (ATIVAN ) tablet 0.5 mg, 0.5 mg, Oral, TID, Josilyn Shippee L, NP, 0.5 mg at 09/01/24 1416   midazolam  PF (VERSED ) injection 1 mg, 1 mg, Intramuscular, Once, Rogelia Jerilynn RAMAN, MD   mirtazapine  (REMERON ) tablet 30 mg, 30 mg, Oral, QHS, Json Koelzer L, NP, 30 mg at 08/30/24 2136   pantoprazole  (PROTONIX ) EC tablet 40 mg, 40 mg, Oral, Daily, Melvenia Motto, MD, 40 mg at 09/01/24 1412   QUEtiapine  (SEROQUEL ) tablet 12.5 mg, 12.5 mg, Oral, QHS, Motley-Mangrum, Jadeka A, PMHNP   QUEtiapine  (SEROQUEL ) tablet 12.5 mg, 12.5 mg, Oral, Q8H PRN, Motley-Mangrum, Jadeka A, PMHNP  Current Outpatient Medications:    acetaminophen  (TYLENOL ) 500 MG tablet, Take 1,000 mg by mouth in the morning and at bedtime., Disp: , Rfl:    ascorbic acid  (VITAMIN C ) 500 MG tablet, Take 500 mg by mouth daily., Disp: , Rfl:    Cholecalciferol (VITAMIN D3) 50 MCG (2000 UT) CAPS, Take 1 capsule by mouth daily., Disp: , Rfl:    escitalopram  (LEXAPRO ) 10 MG tablet, Take 10 mg by mouth daily., Disp: , Rfl:    ferrous  sulfate 325 (65 FE) MG tablet, Take 325 mg by mouth every Monday, Wednesday, and Friday., Disp: , Rfl:    lamoTRIgine  (LAMICTAL ) 25 MG tablet, Take 25-50 mg by mouth See admin instructions. Take one tablet by mouth in the morning and then take 2 tablets by mouth in the evening for behaviors per MAR, Disp: , Rfl:    levothyroxine  (SYNTHROID ) 75 MCG tablet, Take 75 mcg by mouth daily before breakfast., Disp: , Rfl:    LORazepam  (ATIVAN ) 1 MG tablet, Take 1 mg by  mouth 3 (three) times daily., Disp: , Rfl:    mirtazapine  (REMERON ) 30 MG tablet, Take 30 mg by mouth at bedtime., Disp: , Rfl:    Multiple Vitamin (MULTIVITAMIN WITH MINERALS) TABS tablet, Take 1 tablet by mouth daily., Disp: , Rfl:    pantoprazole  (PROTONIX ) 40 MG tablet, Take 1 tablet (40 mg total) by mouth daily., Disp: 28 tablet, Rfl: 0   polycarbophil (FIBERCON) 625 MG tablet, Take 625 mg by mouth in the morning and at bedtime., Disp: , Rfl:    rivastigmine (EXELON) 9.5 mg/24hr, Place 9.5 mg onto the skin daily., Disp: , Rfl:    ondansetron  (ZOFRAN ) 4 MG tablet, Take 1 tablet (4 mg total) by mouth every 6 (six) hours. (Patient not taking: Reported on 08/29/2024), Disp: 12 tablet, Rfl: 0 [2] No Known Allergies

## 2024-09-01 NOTE — ED Notes (Signed)
 Patient resting in bed quietly

## 2024-09-02 DIAGNOSIS — R4189 Other symptoms and signs involving cognitive functions and awareness: Secondary | ICD-10-CM | POA: Insufficient documentation

## 2024-09-02 NOTE — ED Notes (Signed)
 Pt ate about 5 pieces of her peaches and drank 60ml of her tea.

## 2024-09-02 NOTE — ED Provider Notes (Signed)
 Emergency Medicine Observation Re-evaluation Note  Holly Salazar is a 89 y.o. female, seen on rounds today.  Pt initially presented to the ED for complaints of behavioral symptoms Currently, the patient is resting  Physical Exam  BP (!) 154/58 (BP Location: Right Arm)   Pulse 78   Temp 98.9 F (37.2 C) (Axillary)   Resp 18   SpO2 98%  Physical Exam General: not in distress Cardiac: rr Lungs: non labored Psych: calm   ED Course / MDM  EKG:EKG Interpretation Date/Time:  Tuesday August 29 2024 22:38:32 EST Ventricular Rate:  75 PR Interval:  195 QRS Duration:  88 QT Interval:  395 QTC Calculation: 442 R Axis:   68  Text Interpretation: Sinus rhythm Nonspecific T abnormalities, lateral leads Confirmed by Melvenia Motto [phone]) on 08/29/2024 10:44:40 PM  I have reviewed the labs performed to date as well as medications administered while in observation.  Recent changes in the last 24 hours include none.  Plan  Current plan is for inpatient psych.    Neysa Caron PARAS, DO 09/02/24 [phone]

## 2024-09-02 NOTE — Progress Notes (Signed)
 ICM consulted by psych NP for coordinating return to [facility]. She was told by nursing that they will have to evaluate pt on Monday to see if he can return.   Air traffic controller of nursing [phone] states that she will need to complete an assessment on Monday 1/12 before 9 am to assess if patient can return to facility

## 2024-09-02 NOTE — Consult Note (Signed)
 [facility] Consult Follow up  Patient Name: .Holly Salazar  MRN: [id number]  DOB: Dec 19, 1935  Consult Order details:  Orders (From admission, onward)     Start     Ordered   08/30/24 0005  CONSULT TO CALL ACT TEAM       Ordering Provider: Melvenia Motto, MD  Provider:  (Not yet assigned)  Question:  Reason for Consult?  Answer:  Psych consult   08/30/24 0004             Mode of Visit: In person    Psychiatry Consult Evaluation  Service Date: September 02, 2024 LOS:  LOS: 0 days  Chief Complaint for making suicidal statements, stating that she wants to die and aggression  Primary Psychiatric Diagnoses  Suspected neurocognitive disorder    Assessment  Holly Salazar is a 89 y.o. female admitted: Presented to the ED on 08/29/2024  6:42 PM for making suicidal statements, stating that she wants to die and aggression. She carries the psychiatric diagnoses of depression and anxiety and has a past medical history of hypertension and GERD. Patient has no formal neurocognitive disorder diagnosis. However, at baseline she is confused.  Patient presented from [facility] facility on 08/29/2024 under IVC due to worsening aggression and making suicidal statements. Over the course of the patient's stay, from 1/6 to 1/8 she has been exhibiting aggressive behaviors described as screaming, trying to get out of the bed, biting, banging her head and using derogatory language towards staff. On 08/31/24, she was started on Seroquel  12.5 mg po QHS for mood stabilization/agitation. She has shown improvement since starting Seroquel  as evidenced by decreased agitation and has not required intramuscular agitation medications or restraints in the past 24 hours. She had been refusing oral medications. However, per [facility], patient has been compliant with taking medications today. Urinalysis was completed on 08/31/2024 and is negative for UTI. Patient's progressive symptoms may be related to undiagnosed  neurocognitive disorder. Per chart review, patient has not been seen by outpatient neurology and has an upcoming appointment with neurology this month for an evaluation.   On 09/01/2024, patient had been sleeping throughout the morning and this afternoon. On approach, late this afternoon patient was lying down in bed with her eyes closed in no acute distress. Patient was awaken by verbal command and soft touch to her arm. Patient opened her eyes but would not verbally respond to this provider on evaluation. She vaguely stated that her neck hurt but would not further elaborate. I attempted to engage the patient to get additional information pertaining to her pain and she only stated that her body hurts all over. Per sitter, patient has been repositioned in bed to help reduce discomfort. Per MAR, she was given Tylenol  PO at 2:16 PM for pain. Again, I attempted to engage the patient to complete the psychiatric evaluation. However, she would not participate in the assessment. Per sitter, patient has had minimum fluid intake with prompting and has refused meals. Sitter continues to offer meals, snack and fluids. Patient has not displayed any aggression, self-harm or psychotic behaviors throughout the day.   On 09/02/24, Patient observed lying down in bed asleep in no acute distress this morning around 10 AM. Per sitter, patient has consumed small bites of food and drinking fluids and has not displayed any aggression this morning. Per MAR, patient has been compliant with taking medications. No aggression documented overnight by nursing. As of this afternoon, patient continues to rest in bed with no  acute distress. No aggression or self harm behaviors noted this afternoon.   I spoke to Corressa at [facility] via telephone identifies herself as catering manager of nursing this morning. I provided an update that the patient was started on Seroquel  12.5 mg at bedtime and has responded well to the medication without any  aggressive behaviors, self-harm behaviors or suicidal statements. Patient has not required agitation medication in the past 2 days. I discussed at this time patient appears at baseline and would like to discuss discharge plans for the patient to return back to [facility] living where she resides. Corressa stated that she would need to talk to Cassandra who is the librarian, academic before patient can return back to the facility by Monday. I asked if there is a way to contact Cassandra over the weekend for patient to return back to the facility today. Corressa stated that she will contact Cassandra at some point because she is in the middle of a med pass and for this provider to contact her around 2 PM today for an answer.  Update: @ 2:15 PM: Per Corressa, DON., she was advised by Calton, executive director that the patient would need to be assessed by a staff member, which will most likely be her on Monday, 1/12 to return back to facility since the patient has been gone from the facility more than 72 hours. Coressa plans to assess patient on Monday before 9 AM.  Diagnoses:  Active Hospital problems: Principal Problem:   Suspected neurocognitive disorder    Plan   ## Psychiatric Medication Recommendations:  Continue Lexapro  10 mg daily Continue Lamictal  25 mg daily and 50 mg at bedtime Continue Ativan  0.5 mg three times daily Continue Remeron  30 mg at bedtime Continue Seroquel  12.5 mg p.o. at bedtime  ## Medical Decision Making Capacity: Patient has a guardian and has thus been adjudicated incompetent; please involve patients guardian in medical decision making  ## Further Work-up:   EKG, While pt on Qtc prolonging medications, please monitor & replete K+ to 4 and Mg2+ to 2, U/A, or UDS  -- most recent EKG on 08/31/2024 had QtC of 442  -- Pertinent labwork reviewed earlier this admission includes: CBC, CMP, EKG, UDS   ## Disposition:-- Patient continues to be declined for  inpatient psychiatric placement. Patient appears to be at baseline and has not displayed any aggressive behaviors, self harm behaviors or endorsed suicidal ideations over the past two days. At this time, patient does not require inpatient psychiatric hospitalization and can return back to [facility]. This provider Drusilla, NP) reached out to [facility] this morning to discuss discharge plans and coordinate patient return back to the facility where she resides. However, I was told by Raoul, DON., that Cassandra, executive director would need to approve. Per Corressa, DON., she was advised by Calton, executive director that the patient would need to be assessed by a staff member, which will most likely be her on Monday, 1/12 to return back to facility since the patient has been gone from the facility more than 72 hours. Coressa plans to assess patient on Monday before 9 AM. Will place a TOC consult to assist with patient returning back to facility.    ## Behavioral / Environmental: -Delirium Precautions: Delirium Interventions for Nursing and Staff: - RN to open blinds every AM. - To Bedside: Glasses, hearing aide, and pt's own shoes. Make available to patients. when possible and encourage use. - Encourage po fluids when appropriate, keep  fluids within reach. - OOB to chair with meals. - Passive ROM exercises to all extremities with AM & PM care. - RN to assess orientation to person, time and place QAM and PRN. - Recommend extended visitation hours with familiar family/friends as feasible. - Staff to minimize disturbances at night. Turn off television when pt asleep or when not in use., Difficult Patient (SELECT OPTIONS FROM BELOW), To minimize splitting of staff, assign one staff person to communicate all information from the team when feasible., or Utilize compassion and acknowledge the patient's experiences while setting clear and realistic expectations for care.    ## Safety  and Observation Level:  - Based on my clinical evaluation, I estimate the patient to be at low risk of self harm in the current setting. - At this time, we recommend  1:1 Observation. This decision is based on my review of the chart including patient's history and current presentation, interview of the patient, mental status examination, and consideration of suicide risk including evaluating suicidal ideation, plan, intent, suicidal or self-harm behaviors, risk factors, and protective factors. This judgment is based on our ability to directly address suicide risk, implement suicide prevention strategies, and develop a safety plan while the patient is in the clinical setting. Please contact our team if there is a concern that risk level has changed.  CSSR Risk Category:C-SSRS RISK CATEGORY: Low Risk  Suicide Risk Assessment: Patient has following modifiable risk factors for suicide: current symptoms: anxiety/panic, insomnia, impulsivity, anhedonia, hopelessness, which we are addressing by recommending inpatient psychiatric admission. Patient has following non-modifiable or demographic risk factors for suicide: unable to assess Patient has the following protective factors against suicide: Supportive friends  Thank you for this consult request. Recommendations have been communicated to the primary team.  We sign off at this time.   Lynnleigh Soden, Wyline CROME, NP       History of Present Illness  Relevant Aspects of Hospital ED Course: Per Dr. Melvenia, EDP note, Patient presents for suicidal ideation. Medical history includes HTN, osteoporosis. It is unclear if she has a documented history of dementia, however, she is prescribed rivastigmine. She arrives from [place] nursing facility. Patient reportedly had onset of agitation and threats of suicide this afternoon. She was given a dose of Ativan  at the facility. Police were called to the scene and she reportedly attempted to grab one of their pistols. EMS gave  2.5 mg of Haldol  intramuscularly and 2.5 mg of Versed  intravenously. She has had improvement in her agitation. She continues to endorse suicidal ideation.  Psych ROS:  Depression: Positive Anxiety: Positive Mania (lifetime and current): Unable to assess Psychosis: (lifetime and current): Unable to assess  Collateral information:   09/02/24: I spoke to Corressa at [place] via telephone identifies herself as catering manager of nursing. I provided her with an update that the patient was started on Seroquel  12.5 mg at bedtime and has responded well to the medication without any aggressive behaviors, self-harm behaviors or suicidal statements. Patient has not required agitation medication in the past 2 days. I discussed at this time patient appears at baseline and would like to discuss discharge plans for the patient to return back to [facility] living where she resides. Corressa stated that she would need to talk to Cassandra who is the librarian, academic before patient can return back to the facility by Monday. I asked if there is a way to contact Cassandra over the weekend for patient to return back to the facility today. Corressa stated  that she will contact Cassandra at some point because she is in the middle of a med pass and for this provider to contact her around 2 PM today for an answer.  Update: @ 2:15 PM: Per Corressa, DON., she was advised by Calton, executive director that the patient would need to be assessed by a staff member, which will most likely be her on Monday, 1/12 to return back to facility since the patient has been gone from the facility more than 72 hours. Coressa plans to assess patient on Monday before 9 AM.  09/01/24: I spoke to Calton Bras [phone] this afternoon who identifies herself as the librarian, academic for [facility] living. She states that over the course of 2 weeks the patient has been having outbursts and suicidal tendencies. She states that  the patient has been combative and threatening to hurt herself and asking other residents to kill her. She states that at baseline the patient can tell he what she has eaten, if she has taken a shower and the day of the week. She also states that at baseline the patient is confused and depending on if she is having a good day she can have a conversation. She states that at baseline the patient uses a wheelchair and every blue moon she may use a walker.   Review of Systems  Unable to perform ROS: Other (asleep)     Psychiatric and Social History  Psychiatric History:  Information collected from EMR and Cassandra at [facility]  Prev Dx/Sx: History of Depression and Anxiety Current Psych Provider: Yes Home Meds (current): Yes, Lexapro  10 mg daily, Lamictal  25 mg daily and 50 mg at bedtime, Ativan  0.5 mg three times daily, Remeron  30 mg at bedtime Therapy: No   Prior Psych Hospitalization: UTA Prior Self Harm: Yes Prior Violence: Yes  Family Psych History: UTA Family Hx suicide: UTA  Social History:  Developmental Hx: UTA Educational Hx: UTA Occupational Hx: UTA Legal Hx: None  Living Situation: Lives at [facility]  Spiritual Hx: Unknown Access to weapons/lethal means: No  Substance History UTA - Patient UDS positive for benzodiazepines - Patient is prescribed Ativan    Exam Findings  Physical Exam:  Vital Signs:  Temp:  [98.6 F (37 C)-98.9 F (37.2 C)] 98.9 F (37.2 C) (01/10 0535) Pulse Rate:  [70-78] 78 (01/10 0535) Resp:  [17-18] 18 (01/10 0535) BP: (152-162)/(58-66) 154/58 (01/10 0535) SpO2:  [98 %-99 %] 98 % (01/10 0535) Blood pressure (!) 154/58, pulse 78, temperature 98.9 F (37.2 C), temperature source Axillary, resp. rate 18, SpO2 98%. There is no height or weight on file to calculate BMI.  Physical Exam Cardiovascular:     Rate and Rhythm: Normal rate.  Pulmonary:     Effort: Pulmonary effort is normal.  Psychiatric:         Cognition and Memory: Cognition is impaired.     Mental Status Exam: General Appearance: Lying in bed with bilateral soft mitten restraints in place; intermittently fidgeting; requires frequent redirection  Orientation:  Other:  Oriented to name only  Memory:  Immediate;   unable to assess  Concentration:  Concentration: Poor  Recall:  Poor  Attention  Poor  Eye Contact:  Fair  Speech:  Mumbled, largely incoherent  Language:  Poor  Volume:  Decreased  Mood: Unable to reliably assess  Affect: Unable to reliably assess  Thought Process: Disorganized  Thought Content: Recent suicidal ideation with dangerous behavior  Suicidal Thoughts:  Yes.  without intent/plan  Homicidal Thoughts:  No  Judgement:  Impaired  Insight:  Unable to assess  Psychomotor Activity:  Increased  Akathisia:  No  Fund of Knowledge:  Fair      Assets:  Manufacturing Systems Engineer Desire for Improvement Financial Resources/Insurance Resilience Social Support  Cognition:  Impaired,  Moderate  ADL's:  Intact  AIMS (if indicated):   N/A     Other History   These have been pulled in through the EMR, reviewed, and updated if appropriate.  Family History:  The patient's family history includes Cancer in her father; Heart disease in her mother.  Medical History: Past Medical History:  Diagnosis Date   Arthritis    knee and back   Cancer (HCC)    melanoma on back   Hypertension    Osteoporosis     Surgical History: Past Surgical History:  Procedure Laterality Date   APPENDECTOMY     CARPAL TUNNEL RELEASE Right    COLONOSCOPY     FEMUR IM NAIL Left 10/25/2019   Procedure: INTRAMEDULLARY (IM) NAIL FEMORAL;  Surgeon: Barbarann Oneil BROCKS, MD;  Location: [facility] OR[facility];  Service: Orthopedics;  Laterality: Left;   HIP FRACTURE SURGERY Right    OPEN REDUCTION INTERNAL FIXATION (ORIF) DISTAL RADIAL FRACTURE Left 01/16/2016   Procedure: OPEN REDUCTION INTERNAL FIXATION (ORIF) LEFT  DISTAL RADIUS FRACTURE WITH REPAIR  RECONSTRUCTION AS NEEDED ;  Surgeon: Elsie Mussel, MD;  Location: [facility] OR;  Service: Orthopedics;  Laterality: Left;   TONSILLECTOMY       Medications:  Current Medications[1]  Allergies: Allergies[2]  Bubba Vanbenschoten L, NP         [1]  Current Facility-Administered Medications:    acetaminophen  (TYLENOL ) tablet 500 mg, 500 mg, Oral, Q6H, Melvenia Motto, MD, 500 mg at 09/01/24 2224   escitalopram  (LEXAPRO ) tablet 10 mg, 10 mg, Oral, Daily, Aliviana Burdell L, NP, 10 mg at 09/02/24 9078   lamoTRIgine  (LAMICTAL ) tablet 25 mg, 25 mg, Oral, Daily, Melvenia Motto, MD, 25 mg at 09/02/24 0920   lamoTRIgine  (LAMICTAL ) tablet 50 mg, 50 mg, Oral, QHS, Melvenia Motto, MD, 50 mg at 09/01/24 2222   levothyroxine  (SYNTHROID ) tablet 75 mcg, 75 mcg, Oral, QAC breakfast, Melvenia Motto, MD, 75 mcg at 09/02/24 9078   LORazepam  (ATIVAN ) tablet 0.5 mg, 0.5 mg, Oral, TID, Hollyanne Schloesser L, NP, 0.5 mg at 09/02/24 9078   midazolam  PF (VERSED ) injection 1 mg, 1 mg, Intramuscular, Once, Rogelia Jerilynn RAMAN, MD   mirtazapine  (REMERON ) tablet 30 mg, 30 mg, Oral, QHS, Kadie Balestrieri L, NP, 30 mg at 09/01/24 2223   pantoprazole  (PROTONIX ) EC tablet 40 mg, 40 mg, Oral, Daily, Melvenia Motto, MD, 40 mg at 09/02/24 0920   QUEtiapine  (SEROQUEL ) tablet 12.5 mg, 12.5 mg, Oral, QHS, Motley-Mangrum, Jadeka A, PMHNP, 12.5 mg at 09/01/24 2222   QUEtiapine  (SEROQUEL ) tablet 12.5 mg, 12.5 mg, Oral, Q8H PRN, Motley-Mangrum, Jadeka A, PMHNP  Current Outpatient Medications:    acetaminophen  (TYLENOL ) 500 MG tablet, Take 1,000 mg by mouth in the morning and at bedtime., Disp: , Rfl:    ascorbic acid  (VITAMIN C ) 500 MG tablet, Take 500 mg by mouth daily., Disp: , Rfl:    Cholecalciferol (VITAMIN D3) 50 MCG (2000 UT) CAPS, Take 1 capsule by mouth daily., Disp: , Rfl:    escitalopram  (LEXAPRO ) 10 MG tablet, Take 10 mg by mouth daily., Disp: , Rfl:    ferrous sulfate 325 (65 FE) MG tablet, Take 325 mg by mouth every Monday, Wednesday, and  Friday., Disp: , Rfl:    lamoTRIgine  (LAMICTAL ) 25 MG tablet, Take 25-50 mg by mouth See admin instructions. Take one tablet by mouth in the morning and then take 2 tablets by mouth in the evening for behaviors per MAR, Disp: , Rfl:    levothyroxine  (SYNTHROID ) 75 MCG tablet, Take 75 mcg by mouth daily before breakfast., Disp: , Rfl:    LORazepam  (ATIVAN ) 1 MG tablet, Take 1 mg by mouth 3 (three) times daily., Disp: , Rfl:    mirtazapine  (REMERON ) 30 MG tablet, Take 30 mg by mouth at bedtime., Disp: , Rfl:    Multiple Vitamin (MULTIVITAMIN WITH MINERALS) TABS tablet, Take 1 tablet by mouth daily., Disp: , Rfl:    pantoprazole  (PROTONIX ) 40 MG tablet, Take 1 tablet (40 mg total) by mouth daily., Disp: 28 tablet, Rfl: 0   polycarbophil (FIBERCON) 625 MG tablet, Take 625 mg by mouth in the morning and at bedtime., Disp: , Rfl:    rivastigmine (EXELON) 9.5 mg/24hr, Place 9.5 mg onto the skin daily., Disp: , Rfl:    ondansetron  (ZOFRAN ) 4 MG tablet, Take 1 tablet (4 mg total) by mouth every 6 (six) hours. (Patient not taking: Reported on 08/29/2024), Disp: 12 tablet, Rfl: 0 [2] No Known Allergies

## 2024-09-03 NOTE — ED Notes (Signed)
 Pt ate 2 bites of mashed potatoes and 60 ML of tea

## 2024-09-03 NOTE — ED Provider Notes (Signed)
 Emergency Medicine Observation Re-evaluation Note  Holly Salazar is a 89 y.o. female, seen on rounds today.  Pt initially presented to the ED for complaints of behavioral symptoms Currently, the patient is waiting placement.  Staff reports that the patient ate her breakfast this morning.  She had a banana, half of her oatmeal and some eggs.  Patient is making mumbling words but unintelligible.  Reported that she has been taking medications in applesauce.  Physical Exam  BP (!) 154/60 (BP Location: Right Arm)   Pulse 93   Temp (!) 97.5 F (36.4 C) (Oral)   Resp 18   SpO2 93%  Physical Exam General: Awake.  No respiratory distress.  Mumbling words Cardiac: Regular. Lungs: No respiratory distress clear to auscultation Psych: Appearance of dementia.  Mumbling words fairly unintelligible. Muscular skeletal: Patient's back and buttocks examined.  Patient has an ecchymosis at about the level of L2 and 3 oval-shaped and about 3 cm length.  No surrounding erythema and no hematoma.  Unclear if this is a prior ecchymoses from a fall or pressure related skin change.  The skin is completely intact.  No erythema or swelling.  He has a similar ecchymoses with a more V-shaped on the left iliac area about 3 cm from the sacrum.  Lightly more erythematous on this buttock suggesting pressure.  No skin breakdown or penetration.  Patient has an older resolving yellowish bruise on the left lateral hip area.  This is nearly healed.  Bilateral lower extremities are symmetric.  Patient's calves are soft and pliable.  Feet are in good condition with no pressure sores or redness on the heels or feet.  ED Course / MDM  EKG:EKG Interpretation Date/Time:  Tuesday August 29 2024 22:38:32 EST Ventricular Rate:  75 PR Interval:  195 QRS Duration:  88 QT Interval:  395 QTC Calculation: 442 R Axis:   68  Text Interpretation: Sinus rhythm Nonspecific T abnormalities, lateral leads Confirmed by Melvenia Motto [phone]) on 08/29/2024  10:44:40 PM  I have reviewed the labs performed to date as well as medications administered while in observation.  Recent changes in the last 24 hours include none.  Plan  Current plan is for placement.  I will have nursing staff place foam dressing over the described areas on the patient's back and buttock.  Also position changing roll side-to-side every 2 hours.    Armenta Canning, MD 09/03/24 1126

## 2024-09-04 ENCOUNTER — Emergency Department (HOSPITAL_COMMUNITY)

## 2024-09-04 ENCOUNTER — Encounter (HOSPITAL_COMMUNITY): Payer: Self-pay | Admitting: Radiology

## 2024-09-04 DIAGNOSIS — F419 Anxiety disorder, unspecified: Secondary | ICD-10-CM | POA: Diagnosis not present

## 2024-09-04 DIAGNOSIS — F329 Major depressive disorder, single episode, unspecified: Secondary | ICD-10-CM | POA: Diagnosis not present

## 2024-09-04 LAB — CBC WITH DIFFERENTIAL/PLATELET
Abs Immature Granulocytes: 0.03 K/uL (ref 0.00–0.07)
Basophils Absolute: 0 K/uL (ref 0.0–0.1)
Basophils Relative: 0 %
Eosinophils Absolute: 0 K/uL (ref 0.0–0.5)
Eosinophils Relative: 0 %
HCT: 39.4 % (ref 36.0–46.0)
Hemoglobin: 12.4 g/dL (ref 12.0–15.0)
Immature Granulocytes: 0 %
Lymphocytes Relative: 14 %
Lymphs Abs: 1.4 K/uL (ref 0.7–4.0)
MCH: 31.2 pg (ref 26.0–34.0)
MCHC: 31.5 g/dL (ref 30.0–36.0)
MCV: 99.2 fL (ref 80.0–100.0)
Monocytes Absolute: 0.8 K/uL (ref 0.1–1.0)
Monocytes Relative: 8 %
Neutro Abs: 7.5 K/uL (ref 1.7–7.7)
Neutrophils Relative %: 78 %
Platelets: 219 K/uL (ref 150–400)
RBC: 3.97 MIL/uL (ref 3.87–5.11)
RDW: 14 % (ref 11.5–15.5)
WBC: 9.7 K/uL (ref 4.0–10.5)
nRBC: 0 % (ref 0.0–0.2)

## 2024-09-04 LAB — URINALYSIS, ROUTINE W REFLEX MICROSCOPIC
Bilirubin Urine: NEGATIVE
Glucose, UA: NEGATIVE mg/dL
Ketones, ur: NEGATIVE mg/dL
Nitrite: POSITIVE — AB
Protein, ur: 30 mg/dL — AB
Specific Gravity, Urine: 1.024 (ref 1.005–1.030)
WBC, UA: >50 WBC/hpf (ref 0–5)
pH: 5 (ref 5.0–8.0)

## 2024-09-04 LAB — COMPREHENSIVE METABOLIC PANEL WITH GFR
ALT: 28 U/L (ref 0–44)
AST: 44 U/L — ABNORMAL HIGH (ref 15–41)
Albumin: 3.4 g/dL — ABNORMAL LOW (ref 3.5–5.0)
Alkaline Phosphatase: 183 U/L — ABNORMAL HIGH (ref 38–126)
Anion gap: 10 (ref 5–15)
BUN: 29 mg/dL — ABNORMAL HIGH (ref 8–23)
CO2: 30 mmol/L (ref 22–32)
Calcium: 9.7 mg/dL (ref 8.9–10.3)
Chloride: 102 mmol/L (ref 98–111)
Creatinine, Ser: 0.72 mg/dL (ref 0.44–1.00)
GFR, Estimated: >60 mL/min
Glucose, Bld: 116 mg/dL — ABNORMAL HIGH (ref 70–99)
Potassium: 4.5 mmol/L (ref 3.5–5.1)
Sodium: 142 mmol/L (ref 135–145)
Total Bilirubin: 0.4 mg/dL (ref 0.0–1.2)
Total Protein: 7.1 g/dL (ref 6.5–8.1)

## 2024-09-04 LAB — CBG MONITORING, ED: Glucose-Capillary: 112 mg/dL — ABNORMAL HIGH (ref 70–99)

## 2024-09-04 MED ORDER — MELATONIN 3 MG PO TABS
3.0000 mg | ORAL_TABLET | Freq: Every evening | ORAL | Status: AC | PRN
  Administered 2024-09-09 – 2024-09-23 (×4): 3 mg via ORAL
  Filled 2024-09-04 (×4): qty 1

## 2024-09-04 MED ORDER — MIRTAZAPINE 7.5 MG PO TABS
15.0000 mg | ORAL_TABLET | Freq: Every day | ORAL | Status: AC
  Administered 2024-09-04 – 2024-09-29 (×25): 15 mg via ORAL
  Filled 2024-09-04 (×25): qty 2

## 2024-09-04 MED ORDER — QUETIAPINE FUMARATE 25 MG PO TABS
12.5000 mg | ORAL_TABLET | Freq: Every day | ORAL | Status: AC
  Administered 2024-09-04 – 2024-09-29 (×25): 12.5 mg via ORAL
  Filled 2024-09-04 (×25): qty 1

## 2024-09-04 MED ORDER — CEPHALEXIN 500 MG PO CAPS
500.0000 mg | ORAL_CAPSULE | Freq: Two times a day (BID) | ORAL | Status: AC
  Administered 2024-09-04 – 2024-09-07 (×7): 500 mg via ORAL
  Filled 2024-09-04 (×7): qty 1

## 2024-09-04 MED ORDER — LACTATED RINGERS IV BOLUS
500.0000 mL | Freq: Once | INTRAVENOUS | Status: AC
  Administered 2024-09-04: 500 mL via INTRAVENOUS

## 2024-09-04 MED ORDER — QUETIAPINE FUMARATE 25 MG PO TABS: 25.0000 mg | ORAL_TABLET | Freq: Every day | ORAL | Status: DC

## 2024-09-04 NOTE — ED Provider Notes (Addendum)
 Emergency Medicine Observation Re-evaluation Note  Holly Salazar is a 89 y.o. female, seen on rounds today.  Pt initially presented to the ED for complaints of behavioral symptoms Currently, the patient is asleep.  Physical Exam  BP 139/60 (BP Location: Left Arm)   Pulse 82   Temp 99.4 F (37.4 C) (Axillary)   Resp 20   SpO2 90%  Physical Exam General: asleep Cardiac: asleep Lungs: asleep Psych: asleep  ED Course / MDM  EKG:EKG Interpretation Date/Time:  Tuesday August 29 2024 22:38:32 EST Ventricular Rate:  75 PR Interval:  195 QRS Duration:  88 QT Interval:  395 QTC Calculation: 442 R Axis:   68  Text Interpretation: Sinus rhythm Nonspecific T abnormalities, lateral leads Confirmed by Melvenia Motto [phone]) on 08/29/2024 10:44:40 PM  I have reviewed the labs performed to date as well as medications administered while in observation.  No recent changes in the last 24 hours.  Plan  Current plan is for discharge to [facility].    Freddi Hamilton, MD 09/04/24 [phone]  Addendum: Patient has been sleeping all morning.  She has difficult to arouse though I was able to wake her up and she seems relatively lethargic.  Unable to be discharged at this time.  Will get CBG but looking at her medication list she might be on too many/2 stronger medicines.  Will reconsult psych to help with medication adjustment for her behavioral issues.   Patient will wake up and follow commands.  Still very sleepy.  Seems to have equal movement/strength in all 4 extremities.  Given her age and poor p.o. intake (though it has been eating and drinking) over the last few days will check some labs and CT head to make sure there is nothing else causing her sleepiness before attributing it just to her meds.   Freddi Hamilton, MD 09/04/24 1148

## 2024-09-04 NOTE — Consult Note (Addendum)
 [place] [facility] Consult Follow up  Patient Name: .Holly Salazar  MRN: [id number]  DOB: 07-Dec-1935  Consult Order details:  Orders (From admission, onward)     Start     Ordered   08/30/24 0005  CONSULT TO CALL ACT TEAM       Ordering Provider: Melvenia Motto, MD  Provider:  (Not yet assigned)  Question:  Reason for Consult?  Answer:  Psych consult   08/30/24 0004             Mode of Visit: In person    Psychiatry Consult Evaluation  Service Date: September 04, 2024 LOS:  LOS: 0 days  Chief Complaint for medication adjustments  Primary Psychiatric Diagnoses  Suspected neurocognitive disorder    Assessment  Holly Salazar is a 89 y.o. female admitted: Presented to the ED on 08/29/2024  6:42 PM for making suicidal statements, stating that she wants to die and aggression. She carries the psychiatric diagnoses of depression and anxiety and has a past medical history of hypertension and GERD. Patient has no formal neurocognitive disorder diagnosis. However, at baseline she is confused.  This 89 year old patient with dementia and recent suicidal ideation with violent impulsivity is now over-sedated following initiation of Seroquel  in combination with benzodiazepine and sedating antidepressant. Although her agitation has improved, she is currently not at baseline mental status and is unable to participate in safe discharge evaluation.  Returning the patient to [facility] in this condition would place her at high risk for falls, aspiration, inability to eat or drink, and inadequate supervision, and staff cannot determine if she has returned to her cognitive baseline.  Diagnoses:  Active Hospital problems: Principal Problem:   Suspected neurocognitive disorder    Plan   ## Psychiatric Medication Recommendations:  Continue Lexapro  10 mg daily Continue Lamictal  25 mg daily and 50 mg at bedtime Decrease Remeron  15 mg at bedtime Continue Seroquel  12.5 mg p.o. at  bedtime  Discontinue Ativan  0.5 mg three times daily  ## Medical Decision Making Capacity: Patient has a guardian and has thus been adjudicated incompetent; please involve patients guardian in medical decision making  ## Further Work-up:   EKG, While pt on Qtc prolonging medications, please monitor & replete K+ to 4 and Mg2+ to 2, U/A, or UDS  -- most recent EKG on 08/31/2024 had QtC of 442  -- Pertinent labwork reviewed earlier this admission includes: CBC, CMP, EKG, UDS   ## Disposition:-- Recommend overnight medical observation to allow: Sedating medications to metabolize. Mental status to return to baseline. Safer assessment of readiness for SNF return Patient does not currently require inpatient psychiatric admission, but is not safe for discharge today due to medication-induced altered mental status..   ## Behavioral / Environmental: -Delirium Precautions: Delirium Interventions for Nursing and Staff: - RN to open blinds every AM. - To Bedside: Glasses, hearing aide, and pt's own shoes. Make available to patients. when possible and encourage use. - Encourage po fluids when appropriate, keep fluids within reach. - OOB to chair with meals. - Passive ROM exercises to all extremities with AM & PM care. - RN to assess orientation to person, time and place QAM and PRN. - Recommend extended visitation hours with familiar family/friends as feasible. - Staff to minimize disturbances at night. Turn off television when pt asleep or when not in use., Difficult Patient (SELECT OPTIONS FROM BELOW), To minimize splitting of staff, assign one staff person to communicate all information from the team when feasible., or Utilize compassion and  acknowledge the patient's experiences while setting clear and realistic expectations for care.    ## Safety and Observation Level:  - Based on my clinical evaluation, I estimate the patient to be at low risk of self harm in the current setting. - At this time, we  recommend  1:1 Observation. This decision is based on my review of the chart including patient's history and current presentation, interview of the patient, mental status examination, and consideration of suicide risk including evaluating suicidal ideation, plan, intent, suicidal or self-harm behaviors, risk factors, and protective factors. This judgment is based on our ability to directly address suicide risk, implement suicide prevention strategies, and develop a safety plan while the patient is in the clinical setting. Please contact our team if there is a concern that risk level has changed.  CSSR Risk Category:C-SSRS RISK CATEGORY: Low Risk  Suicide Risk Assessment: Patient has following modifiable risk factors for suicide: current symptoms: anxiety/panic, insomnia, impulsivity, anhedonia, hopelessness, which we are addressing by recommending inpatient psychiatric admission. Patient has following non-modifiable or demographic risk factors for suicide: unable to assess Patient has the following protective factors against suicide: Supportive friends  Thank you for this consult request. Recommendations have been communicated to the primary team.  We will continue to follow patient at this time.   Quiera Diffee MOTLEY-MANGRUM, PMHNP       History of Present Illness  Relevant Aspects of Hospital ED Course: Per Dr. Melvenia, EDP note, Patient presents for suicidal ideation. Medical history includes HTN, osteoporosis. It is unclear if she has a documented history of dementia, however, she is prescribed rivastigmine. She arrives from [facility]. Patient reportedly had onset of agitation and threats of suicide this afternoon. She was given a dose of Ativan  at the facility. Police were called to the scene and she reportedly attempted to grab one of their pistols. EMS gave 2.5 mg of Haldol  intramuscularly and 2.5 mg of Versed  intravenously. She has had improvement in her agitation. She continues to  endorse suicidal ideation.  Psych ROS:  Depression: Positive Anxiety: Positive Mania (lifetime and current): Unable to assess Psychosis: (lifetime and current): Unable to assess  Collateral information:  I spoke to Calton Bras [phone] this afternoon who identifies herself as the librarian, academic for [employer] living. She states that over the course of 2 weeks the patient has been having outbursts and suicidal tendencies. She states that the patient has been combative and threatening to hurt herself and asking other residents to kill her. She states that at baseline the patient can tell he what she has eaten, if she has taken a shower and the day of the week. She also states that at baseline the patient is confused and depending on if she is having a good day she can have a conversation. She states that at baseline the patient uses a wheelchair and every blue moon she may use a walker.   ROS   Psychiatric and Social History  Psychiatric History:  Information collected from EMR and [facility] at [facility]  Prev Dx/Sx: History of Depression and Anxiety Current Psych Provider: Yes Home Meds (current): Yes, Lexapro  10 mg daily, Lamictal  25 mg daily and 50 mg at bedtime, Ativan  0.5 mg three times daily, Remeron  30 mg at bedtime Therapy: No   Prior Psych Hospitalization: UTA Prior Self Harm: Yes Prior Violence: Yes  Family Psych History: UTA Family Hx suicide: UTA  Social History:  Developmental Hx: UTA Educational Hx: UTA Occupational Hx:  UTA Legal Hx: None  Living Situation: Lives at [facility]  Spiritual Hx: Unknown Access to weapons/lethal means: No  Substance History UTA - Patient UDS positive for benzodiazepines - Patient is prescribed Ativan    Exam Findings  Physical Exam:  Vital Signs:  Temp:  [99.4 F (37.4 C)] 99.4 F (37.4 C) (01/12 0532) Pulse Rate:  [82] 82 (01/12 0532) Resp:  [20] 20 (01/12 0532) BP: (139)/(60)  139/60 (01/12 0532) SpO2:  [90 %] 90 % (01/12 0532) Blood pressure 139/60, pulse 82, temperature 99.4 F (37.4 C), temperature source Axillary, resp. rate 20, SpO2 90%. There is no height or weight on file to calculate BMI.  Physical Exam Neurological:     Mental Status: She is alert.  Psychiatric:        Cognition and Memory: Cognition is impaired.     Mental Status Exam: General Appearance: Lying in bed with bilateral soft mitten restraints in place; intermittently fidgeting; requires frequent redirection  Orientation:  Other:  Oriented to name only  Memory:  Immediate;   unable to assess  Concentration:  Concentration: Poor  Recall:  Poor  Attention  Poor  Eye Contact:  Fair  Speech:  Mumbled, largely incoherent  Language:  Poor  Volume:  Decreased  Mood: Unable to reliably assess  Affect: Unable to reliably assess  Thought Process: Disorganized  Thought Content: Recent suicidal ideation with dangerous behavior  Suicidal Thoughts:  Yes.  without intent/plan  Homicidal Thoughts:  No  Judgement:  Impaired  Insight:  Unable to assess  Psychomotor Activity:  Increased  Akathisia:  No  Fund of Knowledge:  Fair      Assets:  Manufacturing Systems Engineer Desire for Improvement Financial Resources/Insurance Resilience Social Support  Cognition:  Impaired,  Moderate  ADL's:  Intact  AIMS (if indicated):   N/A     Other History   These have been pulled in through the EMR, reviewed, and updated if appropriate.  Family History:  The patient's family history includes Cancer in her father; Heart disease in her mother.  Medical History: Past Medical History:  Diagnosis Date   Arthritis    knee and back   Cancer (HCC)    melanoma on back   Hypertension    Osteoporosis     Surgical History: Past Surgical History:  Procedure Laterality Date   APPENDECTOMY     CARPAL TUNNEL RELEASE Right    COLONOSCOPY     FEMUR IM NAIL Left 10/25/2019   Procedure: INTRAMEDULLARY (IM) NAIL  FEMORAL;  Surgeon: Barbarann Oneil BROCKS, MD;  Location: [facility];  Service: Orthopedics;  Laterality: Left;   HIP FRACTURE SURGERY Right    OPEN REDUCTION INTERNAL FIXATION (ORIF) DISTAL RADIAL FRACTURE Left 01/16/2016   Procedure: OPEN REDUCTION INTERNAL FIXATION (ORIF) LEFT  DISTAL RADIUS FRACTURE WITH REPAIR RECONSTRUCTION AS NEEDED ;  Surgeon: Elsie Mussel, MD;  Location: [facility] OR;  Service: Orthopedics;  Laterality: Left;   TONSILLECTOMY       Medications:  Current Medications[1]  Allergies: Allergies[2]  Christifer Chapdelaine MOTLEY-MANGRUM, PMHNP        [1]  Current Facility-Administered Medications:    acetaminophen  (TYLENOL ) tablet 500 mg, 500 mg, Oral, Q6H, Dixon, Ryan, MD, 500 mg at 09/03/24 1117   escitalopram  (LEXAPRO ) tablet 10 mg, 10 mg, Oral, Daily, White, Patrice L, NP, 10 mg at 09/03/24 1116   lactated ringers  bolus 500 mL, 500 mL, Intravenous, Once, Freddi Hamilton, MD   lamoTRIgine  (LAMICTAL ) tablet 25 mg, 25 mg, Oral, Daily, Dixon,  Ryan, MD, 25 mg at 09/03/24 1119   lamoTRIgine  (LAMICTAL ) tablet 50 mg, 50 mg, Oral, QHS, Dixon, Ryan, MD, 50 mg at 09/03/24 2225   levothyroxine  (SYNTHROID ) tablet 75 mcg, 75 mcg, Oral, QAC breakfast, Melvenia Motto, MD, 75 mcg at 09/03/24 1117   mirtazapine  (REMERON ) tablet 30 mg, 30 mg, Oral, QHS, White, Patrice L, NP, 30 mg at 09/03/24 2225   pantoprazole  (PROTONIX ) EC tablet 40 mg, 40 mg, Oral, Daily, Melvenia Motto, MD, 40 mg at 09/03/24 1116   QUEtiapine  (SEROQUEL ) tablet 12.5 mg, 12.5 mg, Oral, Q8H PRN, Motley-Mangrum, Ashan Cueva A, PMHNP  Current Outpatient Medications:    acetaminophen  (TYLENOL ) 500 MG tablet, Take 1,000 mg by mouth in the morning and at bedtime., Disp: , Rfl:    ascorbic acid  (VITAMIN C ) 500 MG tablet, Take 500 mg by mouth daily., Disp: , Rfl:    Cholecalciferol (VITAMIN D3) 50 MCG (2000 UT) CAPS, Take 1 capsule by mouth daily., Disp: , Rfl:    escitalopram  (LEXAPRO ) 10 MG tablet, Take 10 mg by mouth daily., Disp: , Rfl:    ferrous  sulfate 325 (65 FE) MG tablet, Take 325 mg by mouth every Monday, Wednesday, and Friday., Disp: , Rfl:    lamoTRIgine  (LAMICTAL ) 25 MG tablet, Take 25-50 mg by mouth See admin instructions. Take one tablet by mouth in the morning and then take 2 tablets by mouth in the evening for behaviors per MAR, Disp: , Rfl:    levothyroxine  (SYNTHROID ) 75 MCG tablet, Take 75 mcg by mouth daily before breakfast., Disp: , Rfl:    LORazepam  (ATIVAN ) 1 MG tablet, Take 1 mg by mouth 3 (three) times daily., Disp: , Rfl:    mirtazapine  (REMERON ) 30 MG tablet, Take 30 mg by mouth at bedtime., Disp: , Rfl:    Multiple Vitamin (MULTIVITAMIN WITH MINERALS) TABS tablet, Take 1 tablet by mouth daily., Disp: , Rfl:    pantoprazole  (PROTONIX ) 40 MG tablet, Take 1 tablet (40 mg total) by mouth daily., Disp: 28 tablet, Rfl: 0   polycarbophil (FIBERCON) 625 MG tablet, Take 625 mg by mouth in the morning and at bedtime., Disp: , Rfl:    rivastigmine (EXELON) 9.5 mg/24hr, Place 9.5 mg onto the skin daily., Disp: , Rfl:    ondansetron  (ZOFRAN ) 4 MG tablet, Take 1 tablet (4 mg total) by mouth every 6 (six) hours. (Patient not taking: Reported on 08/29/2024), Disp: 12 tablet, Rfl: 0 [2] No Known Allergies

## 2024-09-04 NOTE — ED Provider Notes (Signed)
 Urinalysis is suggestive of potential urinary infection.  Will start on Keflex  500 mg twice a day.  The patient has delirium, and/or dementia, cannot provide reliable history   Cottie Donnice PARAS, MD 09/04/24 2134

## 2024-09-04 NOTE — ED Provider Notes (Signed)
 Hold evening doses of lamotrigine  and mirtazapine  tonight due to persistent medication-induced sedation and inability to safely arouse the patient, in order to reduce risk of delirium, aspiration, and falls.

## 2024-09-04 NOTE — Progress Notes (Addendum)
 CSW attempted to contact [facility] without success. Unable to leave a vm. Will try back.   Addend @ 9:21AM Per RN, facility came to assess and states pt can return today. CSW will send AVS to facility. EDP aware.   Addend @ 10:07AM AVS faxed to [facility] (fax#:[phone])  Addend @ 11:07AM Bedside RN informed this writer that facility is concerned pt is still sleeping and too sedated. Wyatt reports this is not her baseline. RN informed EDP who will have TTS  take another look at medications. CSW provided update to [facility].   Addend @ 2:51PM Psych NP reported medication adjustments and pt will likely be cleared tomorrow. CSW requested facility is told so that they can plan to assess and this writer will coordinate dc back at that time.

## 2024-09-04 NOTE — Progress Notes (Signed)
 09/04/2024  1208  Light green, dark green, yellow, and purple tubes sent to main lab.

## 2024-09-05 DIAGNOSIS — F419 Anxiety disorder, unspecified: Secondary | ICD-10-CM | POA: Diagnosis not present

## 2024-09-05 DIAGNOSIS — F329 Major depressive disorder, single episode, unspecified: Secondary | ICD-10-CM | POA: Diagnosis not present

## 2024-09-05 NOTE — ED Notes (Addendum)
 Cleaned pt with soap and water , changed brief and bed pad and purwick. Pt can move some what to the left but no so good to the right needed assistance RN assisted

## 2024-09-05 NOTE — ED Notes (Signed)
 Patient alert.. Patient ate dinner.  Cooperative with care.

## 2024-09-05 NOTE — ED Notes (Signed)
 [facility] called Holly Salazar, pts facility to inform them that pt is psych cleared and can return to her facility. [facility] spoke with Particia who said that Deliah Lunger, the health and Wellness Director was in a meeting. Particia said that an in person assessment may need to be completed before pt returns. She said that she would pass the message to Ms. Lunger and she would return the call.   Chesley Holt, [facility]  09/05/24

## 2024-09-05 NOTE — Consult Note (Cosign Needed)
 [facility] Consult Follow up  Patient Name: .Holly Salazar  MRN: [id number]  DOB: [birth year]  Consult Order details:  Orders (From admission, onward)     Start     Ordered   08/30/24 0005  CONSULT TO CALL ACT TEAM       Ordering Provider: Melvenia Motto, MD  Provider:  (Not yet assigned)  Question:  Reason for Consult?  Answer:  Psych consult   08/30/24 0004             Mode of Visit: In person    Psychiatry Consult Evaluation  Service Date: September 05, 2024 LOS:  LOS: 0 days  Chief Complaint for medication adjustments  Primary Psychiatric Diagnoses  Suspected neurocognitive disorder    Assessment  Holly Salazar is a 89 y.o. female admitted: Presented to the ED on 08/29/2024  6:42 PM for making suicidal statements, stating that she wants to die and aggression. She carries the psychiatric diagnoses of depression and anxiety and has a past medical history of hypertension and GERD. Patient has no formal neurocognitive disorder diagnosis. However, at baseline she is confused.  This 89 year old female with dementia initially presented with suicidal ideation and severe agitation likely exacerbated by polypharmacy and infection (UTI). After treatment of UTI and medication optimization, she is now awake, eating, cooperative, and behaviorally stable, though cognitively impaired at baseline. There is no evidence of active suicidality, psychosis, or behavioral instability requiring inpatient psychiatric care at this time.  Diagnoses:  Active Hospital problems: Principal Problem:   Suspected neurocognitive disorder    Plan   ## Psychiatric Medication Recommendations:  Continue Lexapro  10 mg daily Continue Lamictal  25 mg daily and 50 mg at bedtime Decrease Remeron  15 mg at bedtime Continue Seroquel  12.5 mg p.o. at bedtime  Discontinue Ativan  0.5 mg three times daily  ## Medical Decision Making Capacity: Patient has a guardian and has thus been adjudicated incompetent;  please involve patients guardian in medical decision making  ## Further Work-up:   EKG, While pt on Qtc prolonging medications, please monitor & replete K+ to 4 and Mg2+ to 2, U/A, or UDS  -- most recent EKG on 08/31/2024 had QtC of 442  -- Pertinent labwork reviewed earlier this admission includes: CBC, CMP, EKG, UDS   ## Disposition:-- Recommend patient is psychiatrically cleared for discharge from a psychiatric standpoint. Patient does not require inpatient psychiatric hospitalization at this time.  A TOC/Social Work consult has been placed to assist with coordination of discharge back to [facility] or alternate SNF placement, as multiple attempts to contact [facility] have not yet been successful.  ## Behavioral / Environmental: -Delirium Precautions: Delirium Interventions for Nursing and Staff: - RN to open blinds every AM. - To Bedside: Glasses, hearing aide, and pt's own shoes. Make available to patients. when possible and encourage use. - Encourage po fluids when appropriate, keep fluids within reach. - OOB to chair with meals. - Passive ROM exercises to all extremities with AM & PM care. - RN to assess orientation to person, time and place QAM and PRN. - Recommend extended visitation hours with familiar family/friends as feasible. - Staff to minimize disturbances at night. Turn off television when pt asleep or when not in use., Difficult Patient (SELECT OPTIONS FROM BELOW), To minimize splitting of staff, assign one staff person to communicate all information from the team when feasible., or Utilize compassion and acknowledge the patient's experiences while setting clear and realistic expectations for care.    ## Safety and Observation  Level:  - Based on my clinical evaluation, I estimate the patient to be at low risk of self harm in the current setting. - At this time, we recommend  1:1 Observation. This decision is based on my review of the chart including patient's history and current  presentation, interview of the patient, mental status examination, and consideration of suicide risk including evaluating suicidal ideation, plan, intent, suicidal or self-harm behaviors, risk factors, and protective factors. This judgment is based on our ability to directly address suicide risk, implement suicide prevention strategies, and develop a safety plan while the patient is in the clinical setting. Please contact our team if there is a concern that risk level has changed.  CSSR Risk Category:C-SSRS RISK CATEGORY: Low Risk  Suicide Risk Assessment: Patient has following modifiable risk factors for suicide: current symptoms: anxiety/panic, insomnia, impulsivity, anhedonia, hopelessness, which we are addressing by recommending inpatient psychiatric admission. Patient has following non-modifiable or demographic risk factors for suicide: unable to assess Patient has the following protective factors against suicide: Supportive friends  Thank you for this consult request. Recommendations have been communicated to the primary team.  We will continue to follow patient at this time.   Niquan Charnley MOTLEY-MANGRUM, PMHNP       History of Present Illness  Relevant Aspects of Hospital ED Course: Per Dr. Melvenia, EDP note, Patient presents for suicidal ideation. Medical history includes HTN, osteoporosis. It is unclear if she has a documented history of dementia, however, she is prescribed rivastigmine. She arrives from [facility] facility. Patient reportedly had onset of agitation and threats of suicide this afternoon. She was given a dose of Ativan  at the facility. Police were called to the scene and she reportedly attempted to grab one of their pistols. EMS gave 2.5 mg of Haldol  intramuscularly and 2.5 mg of Versed  intravenously. She has had improvement in her agitation. She continues to endorse suicidal ideation.  HPI: Patient was brought to the emergency department on 08/29/2024 from [facility]  due to suicidal ideation and acute mental status change. During the initial ED course, patient demonstrated agitation, impulsivity, and behavioral disturbance and was started on Seroquel  12.5 mg QHS for stabilization.  At presentation, patient was receiving multiple sedating medications including: Ativan  0.5 mg TID Remeron  30 mg HS Seroquel  12.5 mg HS Lamictal  25 mg daily / 50 mg HS Lexapro  10 mg daily  On 09/01/24, the patient developed prolonged sedation after receiving Ativan , Remeron , and Seroquel , remaining asleep from approximately 2300 until 1413. She was minimally arousable at that time. Given her advanced age, dementia, and risk for medication-induced delirium, the following adjustments were made: Ativan  discontinued Remeron  reduced from 30 mg to 15 mg nightly Seroquel  12.5 mg HS continued Melatonin 3-5 mg HS added  These changes were implemented to reduce oversedation while maintaining behavioral stability.  Patient was also found to have a urinary tract infection and was started on Keflex  500 mg every 12 hours, beginning last night.  (Today) This provider observed patient awake, eating breakfast and lunch, and interacting with staff. She expressed a dysphoric mood and stated she was ready to go home. Per nursing: Alert this shift, Medication compliant (crushed in applesauce), Cooperative with care Confused and rambling at times, Sad but redirectable  No current suicidal ideation, homicidal ideation, or behavioral dysregulation observed. Agitation and impulsivity have improved.  Psych ROS:  Depression: Positive, dysphoric mood at times Anxiety: Patient appears calmer Mania (lifetime and current): Unable to assess Psychosis: (lifetime and current): Unable to assess  Collateral information:  Several attempts have been made to contact [facility] facility, and no return calls.   ROS   Psychiatric and Social History  Psychiatric History:  Information collected from EMR  and Cassandra at [facility]  Prev Dx/Sx: History of Depression and Anxiety Current Psych Provider: Yes Home Meds (current): Yes, Lexapro  10 mg daily, Lamictal  25 mg daily and 50 mg at bedtime, Ativan  0.5 mg three times daily, Remeron  30 mg at bedtime Therapy: No   Prior Psych Hospitalization: UTA Prior Self Harm: Yes Prior Violence: Yes  Family Psych History: UTA Family Hx suicide: UTA  Social History:  Developmental Hx: UTA Educational Hx: UTA Occupational Hx: UTA Legal Hx: None  Living Situation: Lives at [facility]  Spiritual Hx: Unknown Access to weapons/lethal means: No  Substance History UTA - Patient UDS positive for benzodiazepines - Patient is prescribed Ativan    Exam Findings  Physical Exam:  Vital Signs:  Temp:  [97.7 F (36.5 C)-98.8 F (37.1 C)] 97.7 F (36.5 C) (01/13 1320) Pulse Rate:  [77-80] 77 (01/13 1320) Resp:  [18-20] 20 (01/13 1320) BP: (147-157)/(65-67) 157/65 (01/13 1320) SpO2:  [97 %] 97 % (01/13 1320) Blood pressure (!) 157/65, pulse 77, temperature 97.7 F (36.5 C), resp. rate 20, SpO2 97%. There is no height or weight on file to calculate BMI.  Physical Exam Neurological:     Mental Status: She is alert.  Psychiatric:        Cognition and Memory: Cognition is impaired.     Mental Status Exam: General Appearance: Lying in bed with bilateral soft mitten restraints in place; intermittently fidgeting; requires frequent redirection  Orientation:  Other:  Oriented to name only  Memory:  Immediate;   unable to assess  Concentration:  Concentration: Poor  Recall:  Poor  Attention  Poor  Eye Contact:  Fair  Speech:  Mumbled, largely incoherent  Language:  Poor  Volume:  Decreased  Mood: Calmer than previous days  Affect: Unable to reliably assess  Thought Process: Disorganized  Thought Content: Better than previous days  Suicidal Thoughts:  Yes.  without intent/plan  Homicidal Thoughts:  No  Judgement:   Impaired  Insight:  Unable to assess  Psychomotor Activity:  Increased  Akathisia:  No  Fund of Knowledge:  Fair    Assets:  Manufacturing Systems Engineer Desire for Improvement Financial Resources/Insurance Resilience Social Support  Cognition:  Impaired,  Moderate  ADL's:  Intact  AIMS (if indicated):   N/A     Other History   These have been pulled in through the EMR, reviewed, and updated if appropriate.  Family History:  The patient's family history includes Cancer in her father; Heart disease in her mother.  Medical History: Past Medical History:  Diagnosis Date   Arthritis    knee and back   Cancer (HCC)    melanoma on back   Hypertension    Osteoporosis     Surgical History: Past Surgical History:  Procedure Laterality Date   APPENDECTOMY     CARPAL TUNNEL RELEASE Right    COLONOSCOPY     FEMUR IM NAIL Left 10/25/2019   Procedure: INTRAMEDULLARY (IM) NAIL FEMORAL;  Surgeon: Barbarann Oneil BROCKS, MD;  Location: [facility] ORS;  Service: Orthopedics;  Laterality: Left;   HIP FRACTURE SURGERY Right    OPEN REDUCTION INTERNAL FIXATION (ORIF) DISTAL RADIAL FRACTURE Left 01/16/2016   Procedure: OPEN REDUCTION INTERNAL FIXATION (ORIF) LEFT  DISTAL RADIUS FRACTURE WITH REPAIR RECONSTRUCTION AS NEEDED ;  Surgeon: Elsie Mussel, MD;  Location: [facility] OR;  Service: Orthopedics;  Laterality: Left;   TONSILLECTOMY       Medications:  Current Medications[1]  Allergies: Allergies[2]  Tarun Patchell MOTLEY-MANGRUM, PMHNP         [1]  Current Facility-Administered Medications:    acetaminophen  (TYLENOL ) tablet 500 mg, 500 mg, Oral, Q6H, Dixon, Ryan, MD, 500 mg at 09/05/24 1238   cephALEXin  (KEFLEX ) capsule 500 mg, 500 mg, Oral, Q12H, Trifan, Donnice PARAS, MD, 500 mg at 09/05/24 1042   escitalopram  (LEXAPRO ) tablet 10 mg, 10 mg, Oral, Daily, White, Patrice L, NP, 10 mg at 09/03/24 1116   lamoTRIgine  (LAMICTAL ) tablet 25 mg, 25 mg, Oral, Daily, Melvenia Motto, MD, 25 mg at 09/03/24 1119   lamoTRIgine   (LAMICTAL ) tablet 50 mg, 50 mg, Oral, QHS, Dixon, Motto, MD, 50 mg at 09/04/24 2130   levothyroxine  (SYNTHROID ) tablet 75 mcg, 75 mcg, Oral, QAC breakfast, Melvenia Motto, MD, 75 mcg at 09/05/24 1042   melatonin tablet 3 mg, 3 mg, Oral, QHS PRN, Motley-Mangrum, Zakariye Nee A, PMHNP   mirtazapine  (REMERON ) tablet 15 mg, 15 mg, Oral, QHS, Motley-Mangrum, Tykeria Wawrzyniak A, PMHNP, 15 mg at 09/04/24 2129   pantoprazole  (PROTONIX ) EC tablet 40 mg, 40 mg, Oral, Daily, Melvenia Motto, MD, 40 mg at 09/05/24 1042   QUEtiapine  (SEROQUEL ) tablet 12.5 mg, 12.5 mg, Oral, QHS, Motley-Mangrum, Krishay Faro A, PMHNP, 12.5 mg at 09/04/24 2131  Current Outpatient Medications:    acetaminophen  (TYLENOL ) 500 MG tablet, Take 1,000 mg by mouth in the morning and at bedtime., Disp: , Rfl:    ascorbic acid  (VITAMIN C ) 500 MG tablet, Take 500 mg by mouth daily., Disp: , Rfl:    Cholecalciferol (VITAMIN D3) 50 MCG (2000 UT) CAPS, Take 1 capsule by mouth daily., Disp: , Rfl:    escitalopram  (LEXAPRO ) 10 MG tablet, Take 10 mg by mouth daily., Disp: , Rfl:    ferrous sulfate 325 (65 FE) MG tablet, Take 325 mg by mouth every Monday, Wednesday, and Friday., Disp: , Rfl:    lamoTRIgine  (LAMICTAL ) 25 MG tablet, Take 25-50 mg by mouth See admin instructions. Take one tablet by mouth in the morning and then take 2 tablets by mouth in the evening for behaviors per MAR, Disp: , Rfl:    levothyroxine  (SYNTHROID ) 75 MCG tablet, Take 75 mcg by mouth daily before breakfast., Disp: , Rfl:    LORazepam  (ATIVAN ) 1 MG tablet, Take 1 mg by mouth 3 (three) times daily., Disp: , Rfl:    mirtazapine  (REMERON ) 30 MG tablet, Take 30 mg by mouth at bedtime., Disp: , Rfl:    Multiple Vitamin (MULTIVITAMIN WITH MINERALS) TABS tablet, Take 1 tablet by mouth daily., Disp: , Rfl:    pantoprazole  (PROTONIX ) 40 MG tablet, Take 1 tablet (40 mg total) by mouth daily., Disp: 28 tablet, Rfl: 0   polycarbophil (FIBERCON) 625 MG tablet, Take 625 mg by mouth in the morning and at  bedtime., Disp: , Rfl:    rivastigmine (EXELON) 9.5 mg/24hr, Place 9.5 mg onto the skin daily., Disp: , Rfl:    ondansetron  (ZOFRAN ) 4 MG tablet, Take 1 tablet (4 mg total) by mouth every 6 (six) hours. (Patient not taking: Reported on 08/29/2024), Disp: 12 tablet, Rfl: 0 [2] No Known Allergies

## 2024-09-05 NOTE — ED Provider Notes (Signed)
 Emergency Medicine Observation Re-evaluation Note  Alaiya Martindelcampo is a 89 y.o. female, seen on rounds today.  Pt initially presented to the ED for complaints of behavioral symptoms Currently, the patient is sleeping. Was much more coherent this morning while tech was taking vitals. Was cooperative.   Physical Exam  BP (!) 147/67 (BP Location: Right Arm)   Pulse 80   Temp 98.8 F (37.1 C) (Oral)   Resp 18   SpO2 97%  Physical Exam General: NAD Lungs: No respiratory distress Psych: Calm, cooperative   ED Course / MDM  EKG:EKG Interpretation Date/Time:  Tuesday August 29 2024 22:38:32 EST Ventricular Rate:  75 PR Interval:  195 QRS Duration:  88 QT Interval:  395 QTC Calculation: 442 R Axis:   68  Text Interpretation: Sinus rhythm Nonspecific T abnormalities, lateral leads Confirmed by Melvenia Motto [phone]) on 08/29/2024 10:44:40 PM  I have reviewed the labs performed to date as well as medications administered while in observation.  Recent changes in the last 24 hours include psychiatry recommended observation overnight and reevaluation today for possible return to SNF. No indications for inpatient psychiatry.   Plan  Current plan is for reevaluation and may be able to go back to SNF.     Gennaro Duwaine CROME, DO 09/05/24 [phone]

## 2024-09-05 NOTE — ED Notes (Signed)
 At this time patient is awake and communicating. She allowed me to take her vitals and she ate her tylenol  with applesauce. She also drink about 24oz of water , stated she was thirsty. Pt does have a purwick on at this time. Brief is dry. Pt kept asking if she was going to die here.

## 2024-09-05 NOTE — ED Notes (Signed)
 Patient has been alert this shift. Medication compliant, crushed in applesauce. Patient is cooperative with care. Patient is confused and rambling at times. Patient is sad.  Patient alert and eating lunch.

## 2024-09-05 NOTE — ED Notes (Signed)
 [facility] called Deliah Lunger, Probation Officer and Wellness at [place], pts facility. [facility] was transferred  but no message could be left. [facility] called back on the main phone number and left a HIPAA compliant message to return the call.   Chesley Holt, [facility]  09/05/24

## 2024-09-06 MED ORDER — LAMOTRIGINE 25 MG PO TABS: 25.0000 mg | ORAL_TABLET | Freq: Every day | ORAL | 0 refills | Status: AC

## 2024-09-06 MED ORDER — CEPHALEXIN 500 MG PO CAPS: 500.0000 mg | ORAL_CAPSULE | Freq: Four times a day (QID) | ORAL | 0 refills | Status: AC

## 2024-09-06 MED ORDER — ESCITALOPRAM OXALATE 10 MG PO TABS: 10.0000 mg | ORAL_TABLET | Freq: Every day | ORAL | 30 refills | Status: AC

## 2024-09-06 MED ORDER — LAMOTRIGINE 25 MG PO TABS: 50.0000 mg | ORAL_TABLET | Freq: Every day | ORAL | 0 refills | Status: AC

## 2024-09-06 NOTE — ED Provider Notes (Signed)
 Emergency Medicine Observation Re-evaluation Note  Holly Salazar is a 89 y.o. female, seen on rounds today.  Pt initially presented to the ED for complaints of behavioral symptoms Currently, the patient is resting comfortably.  Physical Exam  BP (!) 133/58 (BP Location: Right Arm)   Pulse 85   Temp 98.5 F (36.9 C) (Oral)   Resp 16   SpO2 97%  Physical Exam  ED Course / MDM  EKG:EKG Interpretation Date/Time:  Tuesday August 29 2024 22:38:32 EST Ventricular Rate:  75 PR Interval:  195 QRS Duration:  88 QT Interval:  395 QTC Calculation: 442 R Axis:   68  Text Interpretation: Sinus rhythm Nonspecific T abnormalities, lateral leads Confirmed by Melvenia Motto [phone]) on 08/29/2024 10:44:40 PM  I have reviewed the labs performed to date as well as medications administered while in observation.  Recent changes in the last 24 hours include cleared by psychiatry.  Patient being treated for UTI  Plan  Current plan is for back to facility.    Holly Faden, MD 09/06/24 [phone]

## 2024-09-06 NOTE — ED Notes (Signed)
 Patient has been alert this shift. Patient confused and rambling at times.   Patient medication compliant, crushed in applesauce.  Patient has been cooperative with care. Patient needs assist with all ADLs.

## 2024-09-06 NOTE — Progress Notes (Addendum)
 Informed by Psych NP that pt is psych cleared.   CSW attempted to contact Wyatt, Librarian, Academic [phone]) without success. Left HIPAA Compliant voicemail requesting call back.   Addend @ 8:54AM Caressa reported she will come to assess pt before 10AM.   Addend @ 1:55PM CSW attempted to contact Caressa without success. Left HIPAA Compliant voicemail requesting call back. Per RN, assessment  was completed.   Addend @ 3:44PM AVS faxed at [facility] request. She reports her executive director is reviewing medications and will let this writer know once pt can return.   Addend @ 4:44PM Caressa informed she does not have an update but states she provided the Librarian, Academic with this writers contact information. CSW updated Legal Guardian.

## 2024-09-07 LAB — URINE CULTURE: Culture: >=100000 — AB

## 2024-09-07 NOTE — Progress Notes (Addendum)
 Librarian, Academic will not be in until 9. ICM supervisor will call to request update.   Addend @ 2:05PM CSW rec'd call from Baker Daring, NP, at [facility]. CSW reiterated pt is ready for discharge. CSW reiterated pt is not eligible to be placed into SNF from hospital as she has Medicare A/B with no waiver and requires a 3 Midnight Inpatient stau. CSW reiterated pt is not meeting criteria for inpatient admission.  CSW advised pt needed to return and [place] and request assistance from placement SW. Baker reported she will speak with Wyatt and provide this clinical research associate with room and report info.   Addend @ 3:34PM CSW spoke with Powell Irving, Legal Guardian, who reported she just got off of a lengthy meeting with Fredick Rhea. They are reporting their corporate office has still not decided to take pt back. Powell reported she will notify this team when she can. ICM supervisor aware.

## 2024-09-07 NOTE — Progress Notes (Addendum)
 9:09am ICM Supervisor contacted ED- left HIPAA ompliant voicemail requesting call back.   Spoke to ED at [facility]- she was waiting to her a decision from her one up- she expressed concern that pt needed a higher level of care. ICM expressed that pt would need to return to placement and Fredick could sort through that. ED said she would have to call me back. ICM Supervisor expressed pt has been there for 47yrs and this was something they needed to assist the pt with. ED said she understood and would call me back   215pm-ICM Supervisor left VM - no return call as of current 332pm  5:40pm-received a call from Cassandra (ED from [facility]) she stated the facility will not accept pt back and are d/c her. She reported she will bring the d/c paperwork to the ED tomorrow. ICM Supervisor informed ICM Director, Administrator and CSW via email

## 2024-09-08 NOTE — Progress Notes (Addendum)
 ICM supervisor informed that [facility] discharged patient. CSW will complete a referral to [facility] to assist with new ALF placement. Referral sent via email.   CSW also outreached to [employer] to request she provide financial information Garnette and his team.   Addend @ 3:17PM CSW rec'd call from Paramedic in TCU noting facility arrived with discharge notice. CSW advised have registration upload in chart.

## 2024-09-09 NOTE — ED Provider Notes (Signed)
 Emergency Medicine Observation Re-evaluation Note  Holly Salazar is a 89 y.o. female, seen on rounds today.  Pt initially presented to the ED for complaints of behavioral symptoms Originally presented on 08/29/2024 for SI.  History of HTN, osteoporosis.?  Dementia.  Arriving from [facility] facility.  Upon my assessment, the patient is sleeping.  Physical Exam  BP 123/60 (BP Location: Right Arm)   Pulse 73   Temp 97.7 F (36.5 C) (Oral)   Resp 16   SpO2 95%  Physical Exam General: sleeping Cardiac: well-perfused Lungs: no resp distress Psych: sleeping  ED Course / MDM  EKG:EKG Interpretation Date/Time:  Tuesday August 29 2024 22:38:32 EST Ventricular Rate:  75 PR Interval:  195 QRS Duration:  88 QT Interval:  395 QTC Calculation: 442 R Axis:   68  Text Interpretation: Sinus rhythm Nonspecific T abnormalities, lateral leads Confirmed by Melvenia Motto [phone]) on 08/29/2024 10:44:40 PM  I have reviewed the labs performed to date as well as medications administered while in observation.  Recent changes in the last 24 hours include patient expressing SI, very tearful. Finished treatment for UTI yesterday w/ Cephalexin . Afebrile.   Plan  Current plan is for placement.    Franklyn Sid SAILOR, MD 09/09/24 [phone]

## 2024-09-09 NOTE — ED Notes (Signed)
 Pt refused for her temperature to be taken via axillary or oral. Pt does not seem to be febrile at this time and nurse was notified of refusal. Pt was saying kill me while getting her brief changed. She stated that several times and started to weep while covering her face with her blanket.

## 2024-09-09 NOTE — ED Notes (Signed)
 Pts urine is very dark and when offered something to drink, pt refuses and covers her face with her blanket.

## 2024-09-09 NOTE — Progress Notes (Addendum)
 CSW contacted Garnette at [facility] to determine if acceptance decision made.  VM left. ICM following.  Addendum:  Garnette called back- he will talk with DSS on Monday-behavioral concerns warrant a special rate.  Update notes as pt has demonstrated positive behavior and interaction as pt will be difficult to place/accept if high level of unsafe behaviors.  Garnette also asked will Psych follow pt longer. ICM following.

## 2024-09-10 NOTE — ED Notes (Signed)
Patient ate 100% of his dinner.

## 2024-09-10 NOTE — ED Provider Notes (Signed)
 Emergency Medicine Observation Re-evaluation Note  Holly Salazar is a 89 y.o. female, seen on rounds today.  Pt initially presented to the ED for complaints of behavioral symptoms Originally presented on 08/29/2024 for SI.  History of HTN, osteoporosis.?  Dementia.  Arriving from [facility] nursing facility.  Upon my assessment, the patient is sleeping.  Physical Exam  BP 105/61 (BP Location: Right Arm)   Pulse 73   Temp 98.3 F (36.8 C)   Resp 16   SpO2 93%  Physical Exam General: sleeping Cardiac: well-perfused Lungs: no resp distress Psych: sleeping  ED Course / MDM  EKG:EKG Interpretation Date/Time:  Tuesday August 29 2024 22:38:32 EST Ventricular Rate:  75 PR Interval:  195 QRS Duration:  88 QT Interval:  395 QTC Calculation: 442 R Axis:   68  Text Interpretation: Sinus rhythm Nonspecific T abnormalities, lateral leads Confirmed by Melvenia Motto [phone]) on 08/29/2024 10:44:40 PM  I have reviewed the labs performed to date as well as medications administered while in observation.  Recent changes in the last 24 hours include: none.  Plan  Current plan is for placement.    Franklyn Sid SAILOR, MD 09/10/24 [phone]

## 2024-09-10 NOTE — ED Notes (Signed)
Patient sleeping at present time.

## 2024-09-11 NOTE — Progress Notes (Addendum)
 CSW requested update from Garnette w/[facility] on placement search.  Garnette reported he is awaiting a call back from [facility], legal guardian. CSW anticipates delays due to DSS being closed in observance of the holiday.

## 2024-09-11 NOTE — ED Notes (Signed)
 Pt affect very flat mood whining   I want to die, I just want to die . Pt refuses to state why she wants to die just repeatedly states she wants to die. HS medications offered but refused, and pt denies pain at this time.

## 2024-09-11 NOTE — ED Provider Notes (Signed)
 Emergency Medicine Observation Re-evaluation Note  Holly Salazar is a 89 y.o. female, seen on rounds today.  Pt initially presented to the ED for complaints of behavioral symptoms Currently, the patient is sleeping.  Physical Exam  BP (!) 120/51 (BP Location: Right Arm)   Pulse 75   Temp 98.3 F (36.8 C)   Resp 18   SpO2 93%  Physical Exam General: nad Cardiac: regular Lungs: clear Psych: calm and cooperative  ED Course / MDM  EKG:EKG Interpretation Date/Time:  Tuesday August 29 2024 22:38:32 EST Ventricular Rate:  75 PR Interval:  195 QRS Duration:  88 QT Interval:  395 QTC Calculation: 442 R Axis:   68  Text Interpretation: Sinus rhythm Nonspecific T abnormalities, lateral leads Confirmed by Melvenia Motto [phone]) on 08/29/2024 10:44:40 PM  I have reviewed the labs performed to date as well as medications administered while in observation.  Recent changes in the last 24 hours include none.  Plan  Current plan is for placement.    Doretha Folks, MD 09/11/24 [phone]

## 2024-09-12 NOTE — Progress Notes (Addendum)
 CSW requested Holly Salazar CEA, contact Garnette w/[facility] as soon as possible to share details regarding finances. Garnette will outreach to appropriate facilities that are affordable. ICM following.   Addend @ 10:39AM Holly Salazar reported she is awaiting on the Guardian Ad Litem and APS SW to inform her they have access to pt's bank account. She reported the pt banks with a small bank in [place].

## 2024-09-12 NOTE — ED Provider Notes (Signed)
 Emergency Medicine Observation Re-evaluation Note  Holly Salazar is a 89 y.o. female, seen on rounds today.  Pt initially presented to the ED for complaints of behavioral symptoms Currently, the patient is resting.  Physical Exam  BP 125/69 (BP Location: Left Arm)   Pulse 75   Temp 98.9 F (37.2 C) (Oral)   Resp 16   SpO2 97%  Physical Exam General: Calm Cardiac: Well perfused  Lungs: Even respirations  Psych: Calm  ED Course / MDM  EKG:EKG Interpretation Date/Time:  Tuesday August 29 2024 22:38:32 EST Ventricular Rate:  75 PR Interval:  195 QRS Duration:  88 QT Interval:  395 QTC Calculation: 442 R Axis:   68  Text Interpretation: Sinus rhythm Nonspecific T abnormalities, lateral leads Confirmed by Melvenia Motto [phone]) on 08/29/2024 10:44:40 PM  I have reviewed the labs performed to date as well as medications administered while in observation.  Recent changes in the last 24 hours include patient with some passive death wishes documented yesterday by nursing. No specific plan. Cleared by Psychiatry on 09/02/24 for similar behavior.   Plan  Current plan is for placement.    Darra Fonda MATSU, MD 09/12/24 [phone]

## 2024-09-13 NOTE — Progress Notes (Signed)
-  late entry- CSW requested update from Powell Irving at 1:37PM but did not get a response. ICM will follow up tomorrow.

## 2024-09-14 NOTE — Progress Notes (Addendum)
 CSW re-attempted contact w/ Holly Salazar, no success. HIPAA compliant voicemail left requesting a callback.   UPDATE- CSW received call back from [facility]. Heather reports APS was able to gain access to pt bank account in [place], which has around $20K available; however, bank has presented barriers to APS utilizing funds. Holly reports additional barrier as pt owes previous facility approximately $27K after prior POA stopped making payments. Heather states APS is actively working to identify affordable placement for pt w/ assistance from [employer]). ICM continuing to follow.

## 2024-09-14 NOTE — ED Provider Notes (Signed)
 Emergency Medicine Observation Re-evaluation Note  Coni Homesley is a 89 y.o. female, seen on rounds today.  Pt initially presented to the ED for complaints of behavioral symptoms Currently, the patient is resting comfortably.  Patient just showered.  Physical Exam  BP (!) 113/50 (BP Location: Right Arm)   Pulse 74   Temp 98.2 F (36.8 C) (Oral)   Resp 16   SpO2 95%  Physical Exam   ED Course / MDM  EKG:EKG Interpretation Date/Time:  Tuesday August 29 2024 22:38:32 EST Ventricular Rate:  75 PR Interval:  195 QRS Duration:  88 QT Interval:  395 QTC Calculation: 442 R Axis:   68  Text Interpretation: Sinus rhythm Nonspecific T abnormalities, lateral leads Confirmed by Melvenia Motto [phone]) on 08/29/2024 10:44:40 PM  I have reviewed the labs performed to date as well as medications administered while in observation.  Recent changes in the last 24 hours include none.  Plan  Current plan is for placement.    Dasie Faden, MD 09/14/24 [phone]

## 2024-09-14 NOTE — ED Notes (Signed)
 Patient resting in bed quietly

## 2024-09-15 NOTE — NC FL2 (Signed)
 " [place]  MEDICAID FL2 LEVEL OF CARE FORM     IDENTIFICATION  Patient Name: Holly Salazar Birthdate: 1935/12/31 Sex: female Admission Date (Current Location): 08/29/2024  [facility] and [place] Number:  Producer, Television/film/video and Address:  [facility],  [address] [place], [place] [address]      Provider Number: [id number]  Attending Physician Name and Address:  Randol Simmonds, MD  Relative Name and Phone Number:  [facility] Guardian, [phone]    Current Level of Care: Hospital Recommended Level of Care:  (memory care) Prior Approval Number:    Date Approved/Denied:   PASRR Number:    Discharge Plan: Other (Comment) (Memory care)    Current Diagnoses: Patient Active Problem List   Diagnosis Date Noted   Suspected neurocognitive disorder 09/02/2024   Dementia with behavioral disturbance (HCC) 08/31/2024   Heel ulceration (HCC) 01/05/2020   Protein-calorie malnutrition, severe 10/26/2019   Closed intertrochanteric fracture of hip, left, initial encounter (HCC) 10/25/2019   HTN (hypertension) 10/24/2019   Radius and ulna distal fracture 01/16/2016    Orientation RESPIRATION BLADDER Height & Weight     Self  Normal Incontinent Weight:   Height:     BEHAVIORAL SYMPTOMS/MOOD NEUROLOGICAL BOWEL NUTRITION STATUS      Incontinent Diet (see dc summary)  AMBULATORY STATUS COMMUNICATION OF NEEDS Skin   Limited Assist Verbally Normal                       Personal Care Assistance Level of Assistance  Bathing, Feeding, Dressing Bathing Assistance: Limited assistance Feeding assistance: Limited assistance Dressing Assistance: Limited assistance     Functional Limitations Info  Sight, Hearing, Speech Sight Info: Adequate Hearing Info: Adequate Speech Info: Adequate    SPECIAL CARE FACTORS FREQUENCY                       Contractures Contractures Info: Not present    Additional Factors Info  Code Status, Allergies Code Status Info:  Full code Allergies Info: No Known Allergies           Current Medications (09/15/2024):  This is the current hospital active medication list Current Facility-Administered Medications  Medication Dose Route Frequency Provider Last Rate Last Admin   acetaminophen  (TYLENOL ) tablet 500 mg  500 mg Oral Q6H Melvenia Motto, MD   500 mg at 09/15/24 1114   escitalopram  (LEXAPRO ) tablet 10 mg  10 mg Oral Daily White, Patrice L, NP   10 mg at 09/15/24 1113   lamoTRIgine  (LAMICTAL ) tablet 25 mg  25 mg Oral Daily Melvenia Motto, MD   25 mg at 09/15/24 1113   lamoTRIgine  (LAMICTAL ) tablet 50 mg  50 mg Oral QHS Melvenia Motto, MD   50 mg at 09/14/24 2200   levothyroxine  (SYNTHROID ) tablet 75 mcg  75 mcg Oral QAC breakfast Melvenia Motto, MD   75 mcg at 09/15/24 1112   melatonin tablet 3 mg  3 mg Oral QHS PRN Motley-Mangrum, Jadeka A, PMHNP   3 mg at 09/09/24 2242   mirtazapine  (REMERON ) tablet 15 mg  15 mg Oral QHS Motley-Mangrum, Jadeka A, PMHNP   15 mg at 09/14/24 2159   pantoprazole  (PROTONIX ) EC tablet 40 mg  40 mg Oral Daily Melvenia Motto, MD   40 mg at 09/15/24 1113   QUEtiapine  (SEROQUEL ) tablet 12.5 mg  12.5 mg Oral QHS Motley-Mangrum, Jadeka A, PMHNP   12.5 mg at 09/14/24 2159   Current Outpatient Medications  Medication Sig Dispense Refill   acetaminophen  (TYLENOL ) 500 MG tablet Take 1,000 mg by mouth in the morning and at bedtime.     ascorbic acid  (VITAMIN C ) 500 MG tablet Take 500 mg by mouth daily.     cephALEXin  (KEFLEX ) 500 MG capsule Take 1 capsule (500 mg total) by mouth 4 (four) times daily. 20 capsule 0   Cholecalciferol (VITAMIN D3) 50 MCG (2000 UT) CAPS Take 1 capsule by mouth daily.     escitalopram  (LEXAPRO ) 10 MG tablet Take 1 tablet (10 mg total) by mouth daily. 10 tablet 30   ferrous sulfate 325 (65 FE) MG tablet Take 325 mg by mouth every Monday, Wednesday, and Friday.     lamoTRIgine  (LAMICTAL ) 25 MG tablet Take 2 tablets (50 mg total) by mouth at bedtime. 30 tablet 0   lamoTRIgine   (LAMICTAL ) 25 MG tablet Take 1 tablet (25 mg total) by mouth daily. 60 tablet 0   levothyroxine  (SYNTHROID ) 75 MCG tablet Take 75 mcg by mouth daily before breakfast.     Multiple Vitamin (MULTIVITAMIN WITH MINERALS) TABS tablet Take 1 tablet by mouth daily.     pantoprazole  (PROTONIX ) 40 MG tablet Take 1 tablet (40 mg total) by mouth daily. 28 tablet 0   polycarbophil (FIBERCON) 625 MG tablet Take 625 mg by mouth in the morning and at bedtime.     rivastigmine (EXELON) 9.5 mg/24hr Place 9.5 mg onto the skin daily.     ondansetron  (ZOFRAN ) 4 MG tablet Take 1 tablet (4 mg total) by mouth every 6 (six) hours. (Patient not taking: Reported on 08/29/2024) 12 tablet 0     Discharge Medications: Please see discharge summary for a list of discharge medications.  Relevant Imaging Results:  Relevant Lab Results:   Additional Information SSN [id number]  Sheri ONEIDA Sharps, LCSW     "

## 2024-09-15 NOTE — ED Provider Notes (Signed)
 Emergency Medicine Observation Re-evaluation Note  Holly Salazar is a 89 y.o. female, seen on rounds today.  Pt initially presented to the ED for complaints of behavioral symptoms Currently, the patient is resting.  Physical Exam  BP (!) 140/49 (BP Location: Right Arm)   Pulse (!) 54   Temp 98.1 F (36.7 C)   Resp 18   SpO2 100%  Physical Exam General: No acute distress ED Course / MDM  EKG:EKG Interpretation Date/Time:  Tuesday August 29 2024 22:38:32 EST Ventricular Rate:  75 PR Interval:  195 QRS Duration:  88 QT Interval:  395 QTC Calculation: 442 R Axis:   68  Text Interpretation: Sinus rhythm Nonspecific T abnormalities, lateral leads Confirmed by Melvenia Motto [phone]) on 08/29/2024 10:44:40 PM  I have reviewed the labs performed to date as well as medications administered while in observation.  Recent changes in the last 24 hours include no events overnight.  Plan  Current plan is for placement.    Randol Simmonds, MD 09/15/24 [phone]

## 2024-09-15 NOTE — Progress Notes (Signed)
 CSW faxed FL2 and clinical documentation to Garnette ([facility]) and Rilla with [facility]. Rilla contacted CSW to confirm receipt of the referral and stated she will forward pt documentation to the Designer, Industrial/product for review. Review is expected to occur on Monday to determine facilitys ability to accept pt. Rilla advised that pt will need to remain hospitalized during the review process; if pt discharges prior to completion, the referral process would need to restart with a new FL2 and referral originating from pt PCP rather than the hospital.

## 2024-09-15 NOTE — ED Notes (Signed)
 Patient resting in bed at this time.

## 2024-09-16 NOTE — ED Notes (Signed)
 Patient tearful and crying out.  Patient reassured and comforted.

## 2024-09-16 NOTE — ED Notes (Signed)
 Patient is drowsy and sleeping.   Patient did eat breakfast.

## 2024-09-16 NOTE — ED Provider Notes (Signed)
" °  Physical Exam  BP (!) 93/52 (BP Location: Left Arm)   Pulse 80   Temp 97.6 F (36.4 C) (Axillary)   Resp 16   SpO2 94%   Physical Exam  Procedures  Procedures  ED Course / MDM    Medical Decision Making Amount and/or Complexity of Data Reviewed Labs: ordered. Radiology: ordered.  Risk OTC drugs. Prescription drug management.   Reviewing notes it appears that patient is pending nursing home evaluation is likely done on Monday with today being Saturday.       Patsey Lot, MD 09/16/24 [phone]  "

## 2024-09-17 NOTE — ED Notes (Signed)
 Changed pts sacral wound dressing and left lower back dressing.

## 2024-09-17 NOTE — ED Provider Notes (Signed)
 Emergency Medicine Observation Re-evaluation Note  Cintya Daughety is a 89 y.o. female, seen on rounds today.  Pt initially presented to the ED for complaints of behavioral symptoms Currently, the patient is resting.  Physical Exam  BP (!) 125/58 (BP Location: Right Arm)   Pulse 66   Temp 98.3 F (36.8 C) (Oral)   Resp 18   SpO2 99%  Physical Exam General: NAD  ED Course / MDM  EKG:EKG Interpretation Date/Time:  Tuesday August 29 2024 22:38:32 EST Ventricular Rate:  75 PR Interval:  195 QRS Duration:  88 QT Interval:  395 QTC Calculation: 442 R Axis:   68  Text Interpretation: Sinus rhythm Nonspecific T abnormalities, lateral leads Confirmed by Melvenia Motto [phone]) on 08/29/2024 10:44:40 PM  I have reviewed the labs performed to date as well as medications administered while in observation.  Recent changes in the last 24 hours include no acute events reported.  Plan  Current plan is for placement.    Laurice Maude BROCKS, MD 09/17/24 [phone]

## 2024-09-18 MED ORDER — TUBERCULIN PPD 5 UNIT/0.1ML ID SOLN
5.0000 [IU] | Freq: Once | INTRADERMAL | Status: AC
  Administered 2024-09-18: 5 [IU] via INTRADERMAL
  Filled 2024-09-18: qty 0.1

## 2024-09-18 NOTE — ED Notes (Signed)
 Pt resting.

## 2024-09-18 NOTE — Progress Notes (Addendum)
 Outreached to Garnette to request update on status of referral with [facility]. Awaiting response.   Addend @ 7:56AM Per Garnette, pt is being reviewed by [facility] and hopes to have an answer today.   Addend @ 11:16AM Jeanetta, [facility], reported Powell Irving is working with [employer] - Librarian, [employer] on finances. Jeanetta reported they will need a new TB test. Will ask EDP to order.

## 2024-09-18 NOTE — ED Notes (Signed)
 Pt assisted with dinner tray by sitter.  Pt calm and cooperative.  Tray cleared after she was finished.

## 2024-09-18 NOTE — ED Notes (Signed)
 Not waking pt for meds. She is resting

## 2024-09-18 NOTE — ED Provider Notes (Signed)
 Emergency Medicine Observation Re-evaluation Note  Holly Salazar is a 89 y.o. female, seen on rounds today.  Pt initially presented to the ED for complaints of behavioral symptoms Currently, the patient is resting comfortably.  Physical Exam  BP 125/74   Pulse 88   Temp 97.7 F (36.5 C) (Oral)   Resp 18   SpO2 95%  Physical Exam General: resting comfortable Cardiac: normal heart rate and rhythm Lungs: clear to auscultation Psych: calm, cooperative  ED Course / MDM  EKG:EKG Interpretation Date/Time:  Tuesday August 29 2024 22:38:32 EST Ventricular Rate:  75 PR Interval:  195 QRS Duration:  88 QT Interval:  395 QTC Calculation: 442 R Axis:   68  Text Interpretation: Sinus rhythm Nonspecific T abnormalities, lateral leads Confirmed by Melvenia Motto [phone]) on 08/29/2024 10:44:40 PM  I have reviewed the labs performed to date as well as medications administered while in observation.  Recent changes in the last 24 hours include -- no new changes since last eval.  Plan  Current plan is for pending placement, nursing home eval planned for today.    Rosan Sherlean DEL, PA-C 09/18/24 [phone]    Bari Roxie HERO, DO 09/18/24 [phone]

## 2024-09-19 NOTE — Progress Notes (Addendum)
 CSW spoke with Powell Irving who reported she has not yet received paperwork from [facility] but is expecting it. She also reported a wire transfer will be done to pay the facility but does not anticipate this being a barrier to the pt being accepted. CSW informed TB Skin test is to be read tomorrow.   Heather requested AVS when pt discharges. ICM following.   Addend @ 11:47AM Updated notes provided via email.   Addend @ 1:20PM ED at [facility] stated they needed notes on wound and then stated they do not accept wounds. CSW informed she was awaiting clarification from EDP as only one note addressing wound was reviewed. CSW requested EDP consult WOC to provide update on wound and cleaning/dressing guidance. Awaiting their note and will provide to [facility].

## 2024-09-19 NOTE — Consult Note (Signed)
" °  CLINICAL SUPPORT TEAM - WOUND OSTOMY AND CONTINENCE TEAM  CONSULTATION SERVICES   WOC Nurse-Inpatient Note  Not able to assess and recommend wound care orders as patient is located in [place] Imaging, patient will remain on consult list.    Sherrilyn Hals MSN RN [facility] WOC [facility]  [phone] (Available from 7-3 pm Mon-Friday)  "

## 2024-09-19 NOTE — ED Provider Notes (Signed)
 Emergency Medicine Observation Re-evaluation Note  Holly Salazar is a 89 y.o. female, seen on rounds today.  Pt initially presented to the ED for complaints of behavioral symptoms Currently, the patient is resting.  Physical Exam  BP (!) 120/55 (BP Location: Left Arm)   Pulse 69   Temp 98.2 F (36.8 C) (Axillary)   Resp 17   SpO2 95%  Physical Exam General: nad Cardiac: good peripheral perfusion Lungs: bilateral chest rise Psych: resting comfortably   ED Course / MDM  EKG:EKG Interpretation Date/Time:  Tuesday August 29 2024 22:38:32 EST Ventricular Rate:  75 PR Interval:  195 QRS Duration:  88 QT Interval:  395 QTC Calculation: 442 R Axis:   68  Text Interpretation: Sinus rhythm Nonspecific T abnormalities, lateral leads Confirmed by Melvenia Motto [phone]) on 08/29/2024 10:44:40 PM  I have reviewed the labs performed to date as well as medications administered while in observation.  Recent changes in the last 24 hours include no acute events reported.  Plan  Current plan is for placement.      Emil Share, DO 09/19/24 [phone]

## 2024-09-20 NOTE — Progress Notes (Addendum)
 WOC nurse requested imaging. Bedside staff aware and will complete. ICM following for note.   Addend @ 11:31AM Pt denied by [facility] due to wound.   CSW has faxed pt out to SNF with locked unit for review.   [place]- reviewing [facility] [place]- reviewing  [facility]- pending [facility]- declined

## 2024-09-20 NOTE — Consult Note (Addendum)
" °  CLINICAL SUPPORT TEAM - WOUND OSTOMY AND CONTINENCE TEAM  CONSULTATION SERVICES   WOC Nurse-Inpatient Note  This remote consultation was conducted using EMR wound digital images downloaded by the staff member. Pertinent information was reviewed in order to determine recommendations. Coordinated care with primary nurse/ED paramedic/LCSW via Secure Chat.  WOC Nurse Consult Note: Reason for Consult: Sacrum wound Left buttock into hip region DTPI not POA with Epidermal Sloughing; Evolving state Wound type:Pressure Injury Pressure Injury POA: Yes Measurement: wide area of involvement Wound bed: denuded; red, maroon discoloration Periwound:dusky, non-viable tissue present Patient has many comorbidities that may not allow proper dermal healing and could worsen in condition  Recommend Vashe to reduce surface bioburden, xeroform antimicrobial dressing, silicone foam for padding and to secure dressing in place. Prolonged ED stay, there could be a skin failure component to these wounds. Dressing procedure/placement/frequency:  For all current wounds Cleanse wound with Vashe solution # [id number], leave over wound for 10 minutes, then  pat dry with gauze, do not rinse, apply 2 layers of cut Xeroform yellow gauze #[id number] over wound, cover with 4x4 Mepilex foam dressing.    Left buttock 09/20/24  Coccyx  Right buttock    Wound type: PI over Coccyx and Gluteal Cleft region Unstageable PI not POA  Measurement: approx 4x5  cm Wound bed: center of wound with intact non-viable tissue, atrophic, chronic in appearance   Drainage dry serosanguinous on dressing Periwound: erythema  Dressing procedure/placement/frequency: See wound care orders above  Wound type:Right buttock region Stage 2 PI with adjacent blistering that may have ruptured May continue to progress, not POA Measurement: approx 3x4 cm  Wound bed: pink/red Drainage serous fluid on dressing Periwound: appears moist Dressing  procedure/placement/frequency: See above   WOC will place patient of follow-up list. Recommend that staff take weekly images of all wounds or if wounds worsen in condition.   Please reconsult if wound worsens in condition and notify provider.   Sherrilyn Hals MSN RN CWOCN WOC [facility]  [phone] (Available from 7-3 pm Mon-Friday)      "

## 2024-09-20 NOTE — ED Notes (Signed)
 Patient is resting comfortably.

## 2024-09-20 NOTE — ED Notes (Addendum)
 TB results: negative Location: Right Forearm Read at 1400 on 09/20/2024

## 2024-09-20 NOTE — ED Provider Notes (Signed)
 Emergency Medicine Observation Re-evaluation Note  Holly Salazar is a 89 y.o. female, seen on rounds today.  Pt initially presented to the ED for complaints of behavioral symptoms Currently, the patient is calm, no issue.  Physical Exam  BP (!) 107/54 (BP Location: Right Arm)   Pulse 71   Temp 98.6 F (37 C)   Resp 16   SpO2 97%  Physical Exam General: awake  ED Course / MDM  EKG:EKG Interpretation Date/Time:  Tuesday August 29 2024 22:38:32 EST Ventricular Rate:  75 PR Interval:  195 QRS Duration:  88 QT Interval:  395 QTC Calculation: 442 R Axis:   68  Text Interpretation: Sinus rhythm Nonspecific T abnormalities, lateral leads Confirmed by Melvenia Motto [phone]) on 08/29/2024 10:44:40 PM  I have reviewed the labs performed to date as well as medications administered while in observation.  Recent changes in the last 24 hours include nopthing.  Plan  Current plan is for toc placement.    Ruthe Cornet, DO 09/20/24 [phone]

## 2024-09-21 NOTE — Evaluation (Addendum)
 " Physical Therapy Evaluation Patient Details Name: Holly Salazar MRN: [id number] DOB: Mar 21, 1936 Today's Date: 09/21/2024  History of Present Illness  Patient presents 08/29/24  for suicidal ideation.  Medical history includes HTN, osteoporosis, L hip fx/orif.    She arrives from [facility] nursing facility.  Patient reportedly had onset of agitation and threats of suicide  Clinical Impression  Pt admitted with above diagnosis.  Pt currently with functional limitations due to the deficits listed below (see PT Problem List). Pt will benefit from acute skilled PT to increase their independence and safety with mobility to allow discharge.     The patient is very frail, repeats  frequently what therapist spoke. Does  state 'It Hurts. Patient is alert but does not really  participate, extremely deconditioned. Per report, patient was ambulatory on admission. Patient now unable to stand with +2 assist. PT will give trial of therapy to see if patient can improve in  functional mobility,. Patient will benefit from continued inpatient follow up therapy, <3 hours/day    Recommend  low air loss replacement mattress.    If plan is discharge home, recommend the following: Two people to help with walking and/or transfers;Two people to help with bathing/dressing/bathroom;Assistance with feeding   Can travel by private vehicle        Equipment Recommendations None recommended by PT  Recommendations for Other Services       Functional Status Assessment Patient has had a recent decline in their functional status and/or demonstrates limited ability to make significant improvements in function in a reasonable and predictable amount of time     Precautions / Restrictions Precautions Precautions: Fall Restrictions Weight Bearing Restrictions Per Provider Order: No      Mobility  Bed Mobility Overal bed mobility: Needs Assistance Bed Mobility: Rolling, Supine to Sit, Sit to Supine Rolling: Total  assist, +2 for physical assistance, +2 for safety/equipment   Supine to sit: Total assist, +2 for physical assistance, +2 for safety/equipment Sit to supine: Total assist, +2 for physical assistance, +2 for safety/equipment   General bed mobility comments: patient does not assist awith moving extremities    Transfers Overall transfer level: Needs assistance Equipment used: Rolling walker (2 wheels) Transfers: Sit to/from Stand Sit to Stand: Total assist, +2 physical assistance, +2 safety/equipment, From elevated surface           General transfer comment: attempted x 2 to stand, patient does not power up or bear weight.    Ambulation/Gait                  Stairs            Wheelchair Mobility     Tilt Bed    Modified Rankin (Stroke Patients Only)       Balance Overall balance assessment: Needs assistance Sitting-balance support: Bilateral upper extremity supported, Feet supported Sitting balance-Leahy Scale: Poor Sitting balance - Comments: posterior       Standing balance comment: unable                             Pertinent Vitals/Pain Pain Assessment Breathing: occasional labored breathing, short period of hyperventilation Negative Vocalization: occasional moan/groan, low speech, negative/disapproving quality Facial Expression: sad, frightened, frown Body Language: tense, distressed pacing, fidgeting Consolability: distracted or reassured by voice/touch PAINAD Score: 5    Home Living Family/patient expects to be discharged to:: Assisted living  Additional Comments: was ambulatory  at admission 08/29/24.    Prior Function               Mobility Comments: total care now       Extremity/Trunk Assessment   Upper Extremity Assessment Upper Extremity Assessment: Generalized weakness    Lower Extremity Assessment Lower Extremity Assessment: Generalized weakness (appears with knee flexion  tightness)    Cervical / Trunk Assessment Cervical / Trunk Assessment: Kyphotic  Communication   Communication Factors Affecting Communication: Reduced clarity of speech;Difficulty expressing self    Cognition Arousal: Alert Behavior During Therapy: Restless, Lability   PT - Cognitive impairments: Difficult to assess, Orientation   Orientation impairments: Place, Time, Situation                   PT - Cognition Comments: repeats   what therapists says frequently Following commands: Impaired Following commands impaired: Follows one step commands inconsistently     Cueing Cueing Techniques: Gestural cues, Tactile cues     General Comments      Exercises     Assessment/Plan    PT Assessment Patient needs continued PT services  PT Problem List Decreased strength;Decreased range of motion;Decreased knowledge of use of DME;Decreased activity tolerance;Decreased balance;Decreased cognition;Decreased mobility       PT Treatment Interventions DME instruction;Functional mobility training;Therapeutic activities;Therapeutic exercise;Balance training;Patient/family education    PT Goals (Current goals can be found in the Care Plan section)  Acute Rehab PT Goals PT Goal Formulation: Patient unable to participate in goal setting Time For Goal Achievement: 10/05/24 Potential to Achieve Goals: Poor    Frequency Min 1X/week     Co-evaluation               AM-PAC PT 6 Clicks Mobility  Outcome Measure Help needed turning from your back to your side while in a flat bed without using bedrails?: Total Help needed moving from lying on your back to sitting on the side of a flat bed without using bedrails?: Total Help needed moving to and from a bed to a chair (including a wheelchair)?: Total Help needed standing up from a chair using your arms (e.g., wheelchair or bedside chair)?: Total Help needed to walk in hospital room?: Total Help needed climbing 3-5 steps with a  railing? : Total 6 Click Score: 6    End of Session   Activity Tolerance: Patient limited by fatigue Patient left: in bed;with call bell/phone within reach;with bed alarm set Nurse Communication: Mobility status PT Visit Diagnosis: Unsteadiness on feet (R26.81);Adult, failure to thrive (R62.7)    Time: 8641-8581 PT Time Calculation (min) (ACUTE ONLY): 20 min   Charges:   PT Evaluation $PT Eval Low Complexity: 1 Low   PT General Charges $$ ACUTE PT VISIT: 1 Visit         Darice Potters PT Acute Rehabilitation Services Office [phone]   Potters Darice Norris 09/21/2024, 3:13 PM "

## 2024-09-21 NOTE — Progress Notes (Signed)
 09/21/2024  1036  Read TB skin test on Rt arm 0mm

## 2024-09-21 NOTE — Progress Notes (Addendum)
 ICM Director spoke with Kerri who advised to send referral to [facility] since [facility] may be tight on beds. CSW sent referral and notified Logan. ICM following.  Addend @ 12:50PM ICM director speaking with regional team to address SI concerns. ICM following.

## 2024-09-21 NOTE — ED Notes (Signed)
 Patient alert and cooperative. Patient adult brief changed by nurse tech. Patient is total care with changing her brief. JRPRN

## 2024-09-21 NOTE — ED Notes (Signed)
 Patient is alert and cooperative. Denies pain at present. Patient is lying in bed at present.

## 2024-09-21 NOTE — ED Provider Notes (Signed)
 Emergency Medicine Observation Re-evaluation Note  Holly Salazar is a 89 y.o. female, seen on rounds today.  Pt initially presented to the ED for complaints of behavioral symptoms Currently, the patient is awaiting placement.  Physical Exam  BP (!) 110/44 (BP Location: Left Arm)   Pulse (!) 58   Temp 97.8 F (36.6 C)   Resp 16   SpO2 95%  Physical Exam Awake and in no acute distress  ED Course / MDM  EKG:EKG Interpretation Date/Time:  Tuesday August 29 2024 22:38:32 EST Ventricular Rate:  75 PR Interval:  195 QRS Duration:  88 QT Interval:  395 QTC Calculation: 442 R Axis:   68  Text Interpretation: Sinus rhythm Nonspecific T abnormalities, lateral leads Confirmed by Melvenia Motto [phone]) on 08/29/2024 10:44:40 PM  I have reviewed the labs performed to date as well as medications administered while in observation.  Recent changes in the last 24 hours include none.  Plan  Current plan is for TOC placement.    Suzette Pac, MD 09/21/24 [phone]

## 2024-09-22 MED ORDER — CARMEX CLASSIC LIP BALM EX OINT
TOPICAL_OINTMENT | Freq: Once | CUTANEOUS | Status: AC
  Administered 2024-09-22: 1 via TOPICAL
  Filled 2024-09-22: qty 10

## 2024-09-22 NOTE — ED Provider Notes (Signed)
 Emergency Medicine Observation Re-evaluation Note  Holly Salazar is a 89 y.o. female, seen on rounds today.  Pt initially presented to the ED for complaints of behavioral symptoms Currently, the patient is resting .  Physical Exam  BP (!) 102/52 (BP Location: Left Arm)   Pulse 72   Temp 97.8 F (36.6 C)   Resp 18   SpO2 95%  Physical Exam General: nad    ED Course / MDM  EKG:EKG Interpretation Date/Time:  Tuesday August 29 2024 22:38:32 EST Ventricular Rate:  75 PR Interval:  195 QRS Duration:  88 QT Interval:  395 QTC Calculation: 442 R Axis:   68  Text Interpretation: Sinus rhythm Nonspecific T abnormalities, lateral leads Confirmed by Melvenia Motto [phone]) on 08/29/2024 10:44:40 PM  I have reviewed the labs performed to date as well as medications administered while in observation.  Recent changes in the last 24 hours include none .  Plan  Current plan is for TOC placement .    Simon Lavonia SAILOR, MD 09/22/24 [phone]

## 2024-09-22 NOTE — Care Management (Signed)
 ICM Director spoke with the Nursing Home liaison regarding site visit to assess Holly Salazar today. Unfortunately they were unable to come by today, will plan another visit.

## 2024-09-22 NOTE — ED Notes (Signed)
 Patient has been  alert this shift. Patient has been tearful and crying at times.  Patient states that she is dead.  Patient did eat breakfast and refused lunch.  Patient is drinking fluids.  Patient needs assist with all ADLs.

## 2024-09-23 MED ORDER — COLLAGENASE 250 UNIT/GM EX OINT
1.0000 | TOPICAL_OINTMENT | Freq: Every day | CUTANEOUS | Status: AC
  Administered 2024-09-23 – 2024-09-29 (×7): 1 via TOPICAL
  Filled 2024-09-23 (×2): qty 30

## 2024-09-23 NOTE — Consult Note (Signed)
" °  CLINICAL SUPPORT TEAM - WOUND OSTOMY AND CONTINENCE TEAM  CONSULTATION SERVICES   WOC Nurse-Inpatient Note  WOC Nurse Consult Note: see original consult 1/28; concerns for worsening; reconsulted  Reason for Consult: sacral wound  Wound type: 1. Unstageable pressure injury back 100% black necrotic tissue 2. Evolving Deep tissue pressure injury L buttock with eschar noted; lateral aspect remains red moist  Pressure Injury POA: Yes Measurement: see nursing flowsheet  Wound bed: as above  Drainage (amount, consistency, odor) see nursing flowsheet  Periwound: erythema Dressing procedure/placement/frequency: Cleanse Back and L buttocks wound with Vashe, do not rinse.  Apply 1/4 thick layer of Santyl  to wound beds daily, top with saline moist gauze, dry gauze and silicone foam. Would continue with Xeroform gauze to L lateral buttock red moist wound and coccyx/R buttock wounds as per previous order.   Patient should be placed on a low air loss mattress for pressure redistribution and moisture management.  POC discussed with B. Fox, paramedic, appreciate her assistance with this consult.   WOC team will not follow. Please reconsult as needed.   Thank you,    Daira Hine MSN, RN-BC, CWOCN      "

## 2024-09-23 NOTE — ED Provider Notes (Signed)
" °  [place] EMERGENCY DEPARTMENT AT [facility] Emergency Medicine Observation Re-evaluation Note  Holly Salazar is a 89 y.o. female, seen on rounds today.  Pt initially presented on 08/29/24 at 1838 to the ED for complaints of  Chief Complaint  Patient presents with   behavioral symptoms   PMHx: HTN, protein calorie malnutrition, dementia with behavioral disturbance, history of porosis Currently, the patient is sleeping quietly in bed.  Physical Exam  BP (!) 120/53 (BP Location: Left Arm)   Pulse 64   Temp 98.1 F (36.7 C)   Resp 18   SpO2 95%  Physical Exam General: NAD Lungs: Normal effort Psych: Currently calm  ED Course / MDM  EKG:EKG Interpretation Date/Time:  Tuesday August 29 2024 22:38:32 EST Ventricular Rate:  75 PR Interval:  195 QRS Duration:  88 QT Interval:  395 QTC Calculation: 442 R Axis:   68  Text Interpretation: Sinus rhythm Nonspecific T abnormalities, lateral leads Confirmed by Melvenia Motto [phone]) on 08/29/2024 10:44:40 PM  I have reviewed the labs performed to date as well as medications administered while in observation.  Recent changes in the last 24 hours include social work note from yesterday indicates that they are working with the nursing home liaison regarding a site visit for the patient.. Home medications: Reordered by prior team Diet: Ordered by prior team  Plan  Current plan is for awaiting TOC placement. Nursing also expressed concern for patient's buttocks wound, patient has a known pressure ulcer, prefers to lay on that side, therefore wound care consult placed, as well as mattress request.   Rogelia Jerilynn RAMAN, MD 09/23/24 1028  "

## 2024-09-24 NOTE — ED Provider Notes (Signed)
 Emergency Medicine Observation Re-evaluation Note  Holly Salazar is a 89 y.o. female, seen on rounds today.  Pt initially presented to the ED for complaints of behavioral symptoms Currently, the patient is resting  Physical Exam  BP (!) 116/59 (BP Location: Left Arm)   Pulse 78   Temp 97.9 F (36.6 C)   Resp 14   SpO2 98%  Physical Exam General: nad Cardiac: rr Lungs: non labored Psych: calm   ED Course / MDM  EKG:EKG Interpretation Date/Time:  Tuesday August 29 2024 22:38:32 EST Ventricular Rate:  75 PR Interval:  195 QRS Duration:  88 QT Interval:  395 QTC Calculation: 442 R Axis:   68  Text Interpretation: Sinus rhythm Nonspecific T abnormalities, lateral leads Confirmed by Melvenia Motto [phone]) on 08/29/2024 10:44:40 PM  I have reviewed the labs performed to date as well as medications administered while in observation.  Recent changes in the last 24 hours include none.  Plan  Current plan is for TOC placement.    Neysa Caron PARAS, DO 09/24/24 [phone]

## 2024-09-24 NOTE — ED Notes (Signed)
 Pt refused dinner, stated I'm going to die today anyway

## 2024-09-25 LAB — CBC WITH DIFFERENTIAL/PLATELET
Abs Immature Granulocytes: 0.01 10*3/uL (ref 0.00–0.07)
Basophils Absolute: 0 10*3/uL (ref 0.0–0.1)
Basophils Relative: 0 %
Eosinophils Absolute: 0.1 10*3/uL (ref 0.0–0.5)
Eosinophils Relative: 1 %
HCT: 39.6 % (ref 36.0–46.0)
Hemoglobin: 12.2 g/dL (ref 12.0–15.0)
Immature Granulocytes: 0 %
Lymphocytes Relative: 19 %
Lymphs Abs: 1.1 10*3/uL (ref 0.7–4.0)
MCH: 30.3 pg (ref 26.0–34.0)
MCHC: 30.8 g/dL (ref 30.0–36.0)
MCV: 98.3 fL (ref 80.0–100.0)
Monocytes Absolute: 0.3 10*3/uL (ref 0.1–1.0)
Monocytes Relative: 6 %
Neutro Abs: 4 10*3/uL (ref 1.7–7.7)
Neutrophils Relative %: 74 %
Platelets: 387 10*3/uL (ref 150–400)
RBC: 4.03 MIL/uL (ref 3.87–5.11)
RDW: 13.2 % (ref 11.5–15.5)
WBC: 5.5 10*3/uL (ref 4.0–10.5)
nRBC: 0 % (ref 0.0–0.2)

## 2024-09-25 LAB — URINALYSIS, W/ REFLEX TO CULTURE (INFECTION SUSPECTED)
Bilirubin Urine: NEGATIVE
Glucose, UA: NEGATIVE mg/dL
Ketones, ur: 5 mg/dL — AB
Nitrite: POSITIVE — AB
Protein, ur: NEGATIVE mg/dL
Specific Gravity, Urine: 1.021 (ref 1.005–1.030)
WBC, UA: >50 WBC/hpf (ref 0–5)
pH: 5 (ref 5.0–8.0)

## 2024-09-25 LAB — BASIC METABOLIC PANEL WITH GFR
Anion gap: 9 (ref 5–15)
BUN: 22 mg/dL (ref 8–23)
CO2: 30 mmol/L (ref 22–32)
Calcium: 9.2 mg/dL (ref 8.9–10.3)
Chloride: 102 mmol/L (ref 98–111)
Creatinine, Ser: 0.64 mg/dL (ref 0.44–1.00)
GFR, Estimated: >60 mL/min
Glucose, Bld: 212 mg/dL — ABNORMAL HIGH (ref 70–99)
Potassium: 3.8 mmol/L (ref 3.5–5.1)
Sodium: 140 mmol/L (ref 135–145)

## 2024-09-25 MED ORDER — CEFUROXIME AXETIL 500 MG PO TABS
500.0000 mg | ORAL_TABLET | Freq: Two times a day (BID) | ORAL | Status: AC
  Administered 2024-09-25 – 2024-09-29 (×8): 500 mg via ORAL
  Filled 2024-09-25 (×11): qty 1

## 2024-09-25 NOTE — ED Notes (Signed)
Patient re-positioned and turned. 

## 2024-09-25 NOTE — ED Provider Notes (Addendum)
" °  Physical Exam  BP (!) 130/47 (BP Location: Left Arm)   Pulse (!) 58   Temp (!) 97.4 F (36.3 C)   Resp 15   SpO2 98%   Physical Exam  Procedures  Procedures  ED Course / MDM    Medical Decision Making Amount and/or Complexity of Data Reviewed Labs: ordered. Radiology: ordered.  Risk OTC drugs. Prescription drug management.   Patient pending nursing home placement.  Now has been in the ER for 636 hours.  Air mattress had been requested by wound care.  Nursing home liaison was supposed to see patient, however it appears with the weather they were not able to make it.  It appears that will be rescheduled.       Patsey Lot, MD 09/25/24 0710  Reviewed lab work somewhat with nursing.  Has had previous UTI on the 12th of last month.  Had been treated.  However recheck today and does show likely UTI still.  Previous culture reviewed and was E. coli that was pansensitive.  Discussed with pharmacist and will start cefuroxime .    Patsey Lot, MD 09/25/24 1448  "

## 2024-09-25 NOTE — Progress Notes (Signed)
 ICM Director working with regional rep at nursing facility on possible acceptance. CSW following for updates.

## 2024-09-25 NOTE — ED Notes (Signed)
Patient turned and repositioned.

## 2024-09-25 NOTE — ED Notes (Signed)
 Cleaned pt and swapped bedding.  Placed new bandage over sacral wound

## 2024-09-25 NOTE — ED Notes (Signed)
Patient continues to cry.

## 2024-09-25 NOTE — ED Notes (Signed)
 Wound care completed

## 2024-09-26 NOTE — ED Notes (Signed)
"  Wound care provided   "

## 2024-09-26 NOTE — ED Provider Notes (Signed)
 Emergency Medicine Observation Re-evaluation Note  Holly Salazar is a 89 y.o. female, seen on rounds today.  Pt initially presented to the ED for complaints of Agitation Currently, the patient is resting.  Physical Exam  BP (!) 122/59 (BP Location: Right Arm)   Pulse 64   Temp 97.6 F (36.4 C) (Axillary)   Resp 17   SpO2 96%  Physical Exam General: NAD Cardiac: RR Lungs: non labored Psych: calm  ED Course / MDM  EKG:EKG Interpretation Date/Time:  Tuesday August 29 2024 22:38:32 EST Ventricular Rate:  75 PR Interval:  195 QRS Duration:  88 QT Interval:  395 QTC Calculation: 442 R Axis:   68  Text Interpretation: Sinus rhythm Nonspecific T abnormalities, lateral leads Confirmed by Melvenia Motto [phone]) on 08/29/2024 10:44:40 PM  I have reviewed the labs performed to date as well as medications administered while in observation.  Recent changes in the last 24 hours include none.  Plan  Current plan is for TOC placement.    Neysa Caron PARAS, DO 09/26/24 [phone]

## 2024-09-26 NOTE — Progress Notes (Addendum)
 Outreached to [facility] to inquire about assessment. Awaiting response.  Addend @ 11:03AM CSW spoke with Brianna who reported they are unable to assess today but will visit tomorrow around 1pm. ICM Director notified.   Addend @ 2:53PM ICM Director informed this clinical research associate that [facility] will visit tomorrow on at 1:30PM. If they do not visit, Shanna, Regional rep will assess pt herself. ICM following.

## 2024-09-27 LAB — URINE CULTURE: Culture: >=100000 — AB

## 2024-09-27 MED ORDER — DOXYCYCLINE HYCLATE 100 MG PO TABS
100.0000 mg | ORAL_TABLET | Freq: Two times a day (BID) | ORAL | Status: AC
  Administered 2024-09-27 – 2024-09-29 (×6): 100 mg via ORAL
  Filled 2024-09-27 (×6): qty 1

## 2024-09-27 NOTE — ED Notes (Signed)
 Patient alert.  Patient turned and repositioned

## 2024-09-27 NOTE — Progress Notes (Addendum)
 CSW attempted to contact Brianna with [facility] to inquire about assessment.   CSW spoke with Paramedic who informed no one had been to visit. CSW notified ICM Director who will outreach to regional rep, Shanna. ICM following.  Addend @ 2:23PM Brianna and nurse just arrived to assess pt.

## 2024-09-27 NOTE — ED Provider Notes (Signed)
 Emergency Medicine Observation Re-evaluation Note  Jewelz Ricklefs is a 89 y.o. female, seen on rounds today.  Pt initially presented to the ED for complaints of Agitation Currently, the patient is resting in bed..  Physical Exam  BP 135/72 (BP Location: Right Arm)   Pulse 73   Temp (!) 97.5 F (36.4 C) (Axillary)   Resp 18   SpO2 95%  Physical Exam Patient's wounds examined and possible early cellulitis at patient's thoracic chronic wound.   ED Course / MDM  EKG:EKG Interpretation Date/Time:  Tuesday August 29 2024 22:38:32 EST Ventricular Rate:  75 PR Interval:  195 QRS Duration:  88 QT Interval:  395 QTC Calculation: 442 R Axis:   68  Text Interpretation: Sinus rhythm Nonspecific T abnormalities, lateral leads Confirmed by Melvenia Motto [phone]) on 08/29/2024 10:44:40 PM  I have reviewed the labs performed to date as well as medications administered while in observation.  Recent changes in the last 24 hours include working on placement.  Plan  Current plan is for will start on antibiotics for possible cellulitis.  Placement is pending.    Dasie Faden, MD 09/27/24 [phone]

## 2024-09-27 NOTE — Progress Notes (Addendum)
 Assessment today at 1:30PM by [facility].Care team notified via secure chat.

## 2024-09-28 ENCOUNTER — Emergency Department (HOSPITAL_COMMUNITY)

## 2024-09-28 DIAGNOSIS — R609 Edema, unspecified: Secondary | ICD-10-CM

## 2024-09-28 NOTE — ED Provider Notes (Addendum)
 Emergency Medicine Observation Re-evaluation Note  Holly Salazar is a 89 y.o. female, seen on rounds today.  Pt initially presented to the ED for complaints of Agitation Currently, the patient is laying on her left side. She mumbles some.  Physical Exam  BP 116/83 (BP Location: Right Arm)   Pulse 85   Temp 98.1 F (36.7 C) (Oral)   Resp 17   SpO2 98%  Physical Exam General: no distress Lungs: normal effort Psych: not following commands  ED Course / MDM  EKG:EKG Interpretation Date/Time:  Tuesday August 29 2024 22:38:32 EST Ventricular Rate:  75 PR Interval:  195 QRS Duration:  88 QT Interval:  395 QTC Calculation: 442 R Axis:   68  Text Interpretation: Sinus rhythm Nonspecific T abnormalities, lateral leads Confirmed by Melvenia Motto [phone]) on 08/29/2024 10:44:40 PM  I have reviewed the labs performed to date as well as medications administered while in observation.  Recent changes in the last 24 hours include doxycycline  being started yesterday.  Plan  Current plan is for placement.  Patient does not follow commands.  She is laying on her side and is hard to access her chest to reassess her chronic wounds.  The paramedic taking care of her notes these look chronically poor.    Freddi Hamilton, MD 09/28/24 [phone]  2:16 PM I was asked to see the patient.  She has been complaining of her right leg hurting.  Hard to tell exactly where, she kind of just rubs the distal end of her thigh near her knee.  Will get x-rays but also DVT Ultrasound as she has not been very mobile while being in the ED. has a strong DP pulse.    Freddi Hamilton, MD 09/28/24 [phone]

## 2024-09-28 NOTE — ED Notes (Signed)
 Pt alert. Visibly upset, stating she is dying and that her leg hurts. Attempted to reposition pt and assess, pt refused to let go of the side rail on the bed. Pt repositioned to the best of ability.   MD notified of leg pain.

## 2024-09-28 NOTE — Progress Notes (Addendum)
 Awaiting response from [place] at [facility] regarding assessment.   Addend @ 8:40PM  Dena reported she will discuss assessment with her DON and call this writer back after their morning meeting.  Addend @ 11:21AM Requested update from Brianna.  Addend @ 2:38PM [facility] declined due to pts behaviors. Pt faxed out to regular SNFs. No current offers.

## 2024-09-28 NOTE — Progress Notes (Signed)
 VASCULAR LAB    Right lower extremity venous duplex has been performed.  See CV proc for preliminary results.  Gave verbal report to Dr. Freddi LIS, [facility], RVT 09/28/2024, 2:48 PM

## 2024-09-29 NOTE — ED Provider Notes (Signed)
 Emergency Medicine Observation Re-evaluation Note  Holly Salazar is a 89 y.o. female, seen on rounds today.  Pt initially presented to the ED for complaints of Agitation Currently, the patient is resting, calm.  Physical Exam  BP (!) 122/52 (BP Location: Left Arm)   Pulse 77   Temp 98 F (36.7 C) (Axillary)   Resp 14   SpO2 97%  Physical Exam General: Elderly female no distress Cardiac: Regular rate and rhythm Lungs: No increased work of breathing Psych: Currently calm  ED Course / MDM  EKG:EKG Interpretation Date/Time:  Tuesday August 29 2024 22:38:32 EST Ventricular Rate:  75 PR Interval:  195 QRS Duration:  88 QT Interval:  395 QTC Calculation: 442 R Axis:   68  Text Interpretation: Sinus rhythm Nonspecific T abnormalities, lateral leads Confirmed by Melvenia Motto [phone]) on 08/29/2024 10:44:40 PM  I have reviewed the labs performed to date as well as medications administered while in observation.  Recent changes in the last 24 hours include patient has made recent transition to air mattress. Ultrasound without obvious DVT  Plan  Current plan is for placement.    Garrick Charleston, MD 09/29/24 [phone]

## 2024-09-29 NOTE — Progress Notes (Addendum)
 Outreached to Tammy to inquire about whether [facility] can offer a bed.  Tammy inquired about who will pay bill and CSW informed DSS - [place] is guardian. Business office will have to reach out to [place] to discuss.   CSW refaxed pt to [facility] and [employer] at [employer] request. ICM following.

## 2024-09-29 NOTE — ED Notes (Signed)
 Patient turned and repositioned,

## 2024-09-29 NOTE — ED Notes (Signed)
 Patient has been alert. Calling out.   Patient medication compliant.  Cooperative with care.  Patient ate bites for breakfast.
